# Patient Record
Sex: Female | Born: 1980 | Race: White | Hispanic: No | Smoking: Former smoker
Health system: Southern US, Community
[De-identification: ages and names within clinical notes are randomized; demographics above are authoritative.]

## PROBLEM LIST (undated history)

## (undated) DIAGNOSIS — IMO0002 Reserved for concepts with insufficient information to code with codable children: Secondary | ICD-10-CM

## (undated) DIAGNOSIS — K219 Gastro-esophageal reflux disease without esophagitis: Secondary | ICD-10-CM

## (undated) DIAGNOSIS — K8050 Calculus of bile duct without cholangitis or cholecystitis without obstruction: Secondary | ICD-10-CM

## (undated) DIAGNOSIS — N200 Calculus of kidney: Secondary | ICD-10-CM

## (undated) DIAGNOSIS — R87619 Unspecified abnormal cytological findings in specimens from cervix uteri: Secondary | ICD-10-CM

## (undated) DIAGNOSIS — F419 Anxiety disorder, unspecified: Secondary | ICD-10-CM

## (undated) HISTORY — PX: CHOLECYSTECTOMY: SHX55

## (undated) HISTORY — DX: Anxiety disorder, unspecified: F41.9

## (undated) HISTORY — DX: Unspecified abnormal cytological findings in specimens from cervix uteri: R87.619

## (undated) HISTORY — PX: TUBAL LIGATION: SHX77

## (undated) HISTORY — DX: Reserved for concepts with insufficient information to code with codable children: IMO0002

## (undated) HISTORY — PX: CRYOTHERAPY: SHX1416

## (undated) HISTORY — PX: LITHOTRIPSY: SUR834

---

## 2004-04-16 ENCOUNTER — Inpatient Hospital Stay (HOSPITAL_COMMUNITY): Admission: AD | Admit: 2004-04-16 | Discharge: 2004-04-18 | Payer: Self-pay | Admitting: Gynecology

## 2004-04-16 ENCOUNTER — Ambulatory Visit: Payer: Self-pay | Admitting: *Deleted

## 2004-04-16 ENCOUNTER — Inpatient Hospital Stay (HOSPITAL_COMMUNITY): Admission: AD | Admit: 2004-04-16 | Discharge: 2004-04-16 | Payer: Self-pay | Admitting: Gynecology

## 2004-08-13 ENCOUNTER — Emergency Department: Payer: Self-pay | Admitting: Emergency Medicine

## 2004-08-17 ENCOUNTER — Emergency Department: Payer: Self-pay | Admitting: Emergency Medicine

## 2006-06-02 ENCOUNTER — Ambulatory Visit: Payer: Self-pay | Admitting: Gynecology

## 2007-06-15 ENCOUNTER — Ambulatory Visit: Payer: Self-pay | Admitting: Gynecology

## 2007-08-05 ENCOUNTER — Ambulatory Visit: Payer: Self-pay | Admitting: Obstetrics & Gynecology

## 2007-11-09 ENCOUNTER — Ambulatory Visit: Payer: Self-pay | Admitting: Gynecology

## 2007-12-22 ENCOUNTER — Ambulatory Visit: Payer: Self-pay | Admitting: Gynecology

## 2008-06-28 ENCOUNTER — Ambulatory Visit: Payer: Self-pay | Admitting: Family Medicine

## 2008-06-29 ENCOUNTER — Ambulatory Visit: Payer: Self-pay | Admitting: Family Medicine

## 2009-05-06 ENCOUNTER — Ambulatory Visit: Payer: Self-pay | Admitting: Internal Medicine

## 2009-06-28 ENCOUNTER — Ambulatory Visit: Payer: Self-pay | Admitting: Obstetrics & Gynecology

## 2009-07-04 ENCOUNTER — Ambulatory Visit: Payer: Self-pay | Admitting: Obstetrics & Gynecology

## 2009-07-05 ENCOUNTER — Inpatient Hospital Stay (HOSPITAL_COMMUNITY): Admission: AD | Admit: 2009-07-05 | Discharge: 2009-07-05 | Payer: Self-pay | Admitting: Obstetrics & Gynecology

## 2009-07-27 ENCOUNTER — Ambulatory Visit: Payer: Self-pay | Admitting: Family Medicine

## 2009-09-05 ENCOUNTER — Ambulatory Visit: Payer: Self-pay | Admitting: Obstetrics & Gynecology

## 2009-10-02 ENCOUNTER — Ambulatory Visit (HOSPITAL_COMMUNITY): Admission: RE | Admit: 2009-10-02 | Discharge: 2009-10-02 | Payer: Self-pay | Admitting: Obstetrics & Gynecology

## 2009-10-02 ENCOUNTER — Ambulatory Visit: Payer: Self-pay | Admitting: Obstetrics and Gynecology

## 2009-10-09 ENCOUNTER — Ambulatory Visit: Payer: Self-pay | Admitting: Obstetrics and Gynecology

## 2009-11-01 ENCOUNTER — Ambulatory Visit (HOSPITAL_COMMUNITY): Admission: RE | Admit: 2009-11-01 | Discharge: 2009-11-01 | Payer: Self-pay | Admitting: Obstetrics & Gynecology

## 2009-11-07 ENCOUNTER — Ambulatory Visit: Payer: Self-pay | Admitting: Family Medicine

## 2009-12-05 ENCOUNTER — Ambulatory Visit: Payer: Self-pay | Admitting: Obstetrics & Gynecology

## 2009-12-08 ENCOUNTER — Ambulatory Visit (HOSPITAL_COMMUNITY): Admission: RE | Admit: 2009-12-08 | Discharge: 2009-12-08 | Payer: Self-pay | Admitting: Obstetrics & Gynecology

## 2009-12-26 ENCOUNTER — Ambulatory Visit: Payer: Self-pay | Admitting: Obstetrics & Gynecology

## 2010-01-04 ENCOUNTER — Ambulatory Visit (HOSPITAL_COMMUNITY): Admission: RE | Admit: 2010-01-04 | Discharge: 2010-01-04 | Payer: Self-pay | Admitting: Obstetrics & Gynecology

## 2010-01-09 ENCOUNTER — Ambulatory Visit: Payer: Self-pay | Admitting: Family Medicine

## 2010-01-23 ENCOUNTER — Ambulatory Visit: Payer: Self-pay | Admitting: Obstetrics & Gynecology

## 2010-01-30 ENCOUNTER — Ambulatory Visit: Payer: Self-pay | Admitting: Obstetrics & Gynecology

## 2010-01-30 LAB — CONVERTED CEMR LAB: GC Probe Amp, Genital: NEGATIVE

## 2010-02-06 ENCOUNTER — Ambulatory Visit: Payer: Self-pay | Admitting: Obstetrics & Gynecology

## 2010-02-08 ENCOUNTER — Ambulatory Visit: Payer: Self-pay | Admitting: Obstetrics and Gynecology

## 2010-02-13 ENCOUNTER — Ambulatory Visit: Payer: Self-pay | Admitting: Obstetrics & Gynecology

## 2010-02-20 ENCOUNTER — Ambulatory Visit: Payer: Self-pay | Admitting: Family Medicine

## 2010-02-26 ENCOUNTER — Ambulatory Visit: Payer: Self-pay | Admitting: Obstetrics and Gynecology

## 2010-02-27 ENCOUNTER — Inpatient Hospital Stay (HOSPITAL_COMMUNITY): Admission: AD | Admit: 2010-02-27 | Discharge: 2010-03-01 | Payer: Self-pay | Admitting: Obstetrics and Gynecology

## 2010-02-27 ENCOUNTER — Ambulatory Visit: Payer: Self-pay | Admitting: Obstetrics and Gynecology

## 2010-02-28 ENCOUNTER — Encounter: Payer: Self-pay | Admitting: Obstetrics and Gynecology

## 2010-04-03 ENCOUNTER — Ambulatory Visit: Payer: Self-pay | Admitting: Obstetrics & Gynecology

## 2010-07-24 ENCOUNTER — Ambulatory Visit: Payer: Self-pay | Admitting: Family Medicine

## 2010-07-24 ENCOUNTER — Other Ambulatory Visit
Admission: RE | Admit: 2010-07-24 | Discharge: 2010-07-24 | Payer: Self-pay | Source: Home / Self Care | Admitting: Obstetrics & Gynecology

## 2010-09-09 ENCOUNTER — Encounter: Payer: Self-pay | Admitting: Obstetrics and Gynecology

## 2010-11-04 LAB — CBC
HCT: 30.7 % — ABNORMAL LOW (ref 36.0–46.0)
Hemoglobin: 10.6 g/dL — ABNORMAL LOW (ref 12.0–15.0)
MCH: 31.2 pg (ref 26.0–34.0)
Platelets: 200 10*3/uL (ref 150–400)
Platelets: 240 10*3/uL (ref 150–400)
RBC: 3.95 MIL/uL (ref 3.87–5.11)
RDW: 13.3 % (ref 11.5–15.5)
RDW: 13.3 % (ref 11.5–15.5)

## 2010-11-04 LAB — RPR: RPR Ser Ql: NONREACTIVE

## 2010-11-05 ENCOUNTER — Ambulatory Visit: Payer: 59 | Admitting: Obstetrics & Gynecology

## 2010-11-05 DIAGNOSIS — N940 Mittelschmerz: Secondary | ICD-10-CM

## 2010-11-06 ENCOUNTER — Other Ambulatory Visit: Payer: Self-pay | Admitting: Obstetrics & Gynecology

## 2010-11-06 DIAGNOSIS — R102 Pelvic and perineal pain: Secondary | ICD-10-CM

## 2010-11-09 NOTE — Assessment & Plan Note (Unsigned)
Victoria [name], CATINO NO.:  [id number]  MEDICAL RECORD NO.:  [id number]           PATIENT TYPE:  LOCATION:  [facility] at [facility]           FACILITY:  PHYSICIAN:  Argentina Donovan, MD        DATE OF BIRTH:  [birth year]  DATE OF SERVICE:  11/05/2010                                 CLINIC NOTE  The patient is a 30 year old Caucasian female gravida 2, para 2-0-0-2 who has a 25-year-old child and one that is about 68 months old.  She used birth control pill between those babies, but had her tubes tied after the last one.  First several periods were fine.  She was seen here with no complaints in December.  However, she started couple months prior to that having increasingly severe pain with her periods in the lower abdomen, especially on the right side.  The pain is increased, just starts with the period.  She bleeds no more than usual about 6 days, but the pain is fairly disabling for about 4 days.  It radiates also into her back.  We are going to get an ultrasound to check on to make sure there is no masses or anything in the pelvis.  I am starting her on birth control pills for 2 months, I think that that probably will bring her back in 2 months and see how she is doing.  If she is doing well, I think we will probably keep her on the birth control pills or have her take ibuprofen during her period at a higher dose.  The alternative of course is if it does not work, we have to think of either a pelvic congestion syndrome or adenomyosis or possible endometriosis.  IMPRESSION:  Severe secondary dysmenorrhea following tubal ligation.          ______________________________ Argentina Donovan, MD    PR/MEDQ  D:  11/05/2010  T:  11/06/2010  Job:  [id number]

## 2010-11-13 ENCOUNTER — Ambulatory Visit (HOSPITAL_COMMUNITY)
Admission: RE | Admit: 2010-11-13 | Discharge: 2010-11-13 | Disposition: A | Discharge status: Home / Self Care | Payer: 59 | Source: Ambulatory Visit | Attending: Obstetrics & Gynecology | Admitting: Obstetrics & Gynecology

## 2010-11-13 DIAGNOSIS — R1031 Right lower quadrant pain: Secondary | ICD-10-CM | POA: Insufficient documentation

## 2010-11-13 DIAGNOSIS — R102 Pelvic and perineal pain: Secondary | ICD-10-CM

## 2010-11-21 LAB — URINALYSIS, ROUTINE W REFLEX MICROSCOPIC
Bilirubin Urine: NEGATIVE
Glucose, UA: NEGATIVE mg/dL
Ketones, ur: 15 mg/dL — AB
Nitrite: NEGATIVE
Protein, ur: NEGATIVE mg/dL
Specific Gravity, Urine: 1.025 (ref 1.005–1.030)
Urobilinogen, UA: 1 mg/dL (ref 0.0–1.0)

## 2010-11-21 LAB — COMPREHENSIVE METABOLIC PANEL
AST: 15 U/L (ref 0–37)
Albumin: 3 g/dL — ABNORMAL LOW (ref 3.5–5.2)
Calcium: 9 mg/dL (ref 8.4–10.5)
Chloride: 104 mEq/L (ref 96–112)
GFR calc Af Amer: >60 mL/min (ref >60)
Glucose, Bld: 100 mg/dL — ABNORMAL HIGH (ref 70–99)
Total Bilirubin: 0.4 mg/dL (ref 0.3–1.2)
Total Protein: 6.4 g/dL (ref 6.0–8.3)

## 2010-11-21 LAB — WET PREP, GENITAL: Trich, Wet Prep: NONE SEEN

## 2010-11-21 LAB — CBC
HCT: 34 % — ABNORMAL LOW (ref 36.0–46.0)
Hemoglobin: 11.5 g/dL — ABNORMAL LOW (ref 12.0–15.0)
RBC: 3.72 MIL/uL — ABNORMAL LOW (ref 3.87–5.11)

## 2010-12-25 ENCOUNTER — Ambulatory Visit: Payer: 59 | Admitting: Obstetrics and Gynecology

## 2011-01-01 NOTE — Assessment & Plan Note (Signed)
NAMEDENNICE, TINDOL               ACCOUNT NO.:  [id number]   MEDICAL RECORD NO.:  [id number]          PATIENT TYPE:  POB   LOCATION:  [facility] at [facility]         FACILITY:  [facility]   PHYSICIAN:  Allie Bossier, MD        DATE OF BIRTH:  April 15, 1981   DATE OF SERVICE:  07/24/2010                                  CLINIC NOTE   Ms. Bring is a married white G2, para 1, who has a 63-month-old daughter,  named, Bonnee Quin; 31-year-old, named Karleen Hampshire.  She comes in here for annual  exam.  She has no GYN complaints.  She does say that her family has all  been treated with a Z-Pak for recent upper respiratory infection and now  she has developed the exact same symptoms and is requesting a Z-Pak as  well.   PAST MEDICAL HISTORY:  None.   PAST SURGICAL HISTORY:  She had a tubal ligation 5 months ago.  She had  cryosurgery at age 78.   REVIEW OF SYSTEMS:  Married for the last 5-1/2 years.  She denies  dyspareunia.  She works at [employer].  The remainder of review of systems  questions are negative.  Her Pap smear was last done in September 2010,  and it was negative.   MEDICATIONS:  None.   ALLERGIES:  BACTRIM (causes I get loopy).   SOCIAL HISTORY:  She drinks rare alcohol, denies tobacco or illegal drug  use.   FAMILY HISTORY:  Negative for breast, GYN, and colon malignancies.  No  latex allergies.   PHYSICAL EXAMINATION:  GENERAL:  Well-nourished, well-hydrated very  pleasant white female, height 5 feet 7 inches, weight 139 pounds, blood  pressure 117/70, pulse 86.  HEENT:  Normal.  BREASTS:  Normal.  HEART:  Regular rate and rhythm.  LUNGS:  Clear to auscultation bilaterally.  ABDOMEN:  Benign.  No palpable hepatosplenomegaly.  Well-healed  infraumbilical excision.  GU:  External genitalia, no lesions.  Cervix, parous and no  abnormalities.  She has an ovulatory clear discharge.  Bimanual exam  reveals a normal size, shape, retroverted, minimally mobile uterus, it  is nontender.  Adnexa  nontender.  No masses.   ASSESSMENT AND PLAN:  1. Annual exam.  I have checked the Pap smear.  Recommend self-breast      and self-vulvar exams.  2. Upper respiratory infection.  I will give her a Z-Pak with no      refills.  If she is not better, then she should see her primary      care doctor.      Allie Bossier, MD     MCD/MEDQ  D:  07/24/2010  T:  07/25/2010  Job:  [id number]

## 2011-01-01 NOTE — Assessment & Plan Note (Signed)
Victoria [name], Victoria [name]               ACCOUNT NO.:  [id number]   MEDICAL RECORD NO.:  [id number]          PATIENT TYPE:  POB   LOCATION:  [facility] at [facility]         FACILITY:  [facility]   PHYSICIAN:  Tinnie Gens, MD        DATE OF BIRTH:  02-Oct-1980   DATE OF SERVICE:  06/15/2007                                  CLINIC NOTE   CHIEF COMPLAINT:  Yearly exam.   HISTORY OF PRESENT ILLNESS:  The patient is a 30 year old gravida 1,  para 1 who is here today for a yearly exam.  She is without complaints  today.  She is on Ortho Tri-Cyclen that she uses without difficulty.  Her Paps have been up-to-date.  She has a history of cryosurgery back as  a teenager, but normal Paps since then.   PAST MEDICAL HISTORY:  Negative.   PAST SURGICAL HISTORY:  Negative.   OBSTETRICAL HISTORY:  G1, P1.  SVD at term, 8 pounds.   GYN HISTORY:  Except for her abnormal Pap, no other problems.   MEDICATIONS:  She is on Ortho Tri-Cyclen.   ALLERGIES:  SULFA MEDS.   FAMILY HISTORY:  Breast cancer in maternal aunt.   SOCIAL HISTORY:  She works for [employer].  She denies tobacco, alcohol, or  drug use.   A 14-point review of systems reviewed.  She had a recent URI.  She was  treated with a Z-Pak; but she denies headache, vision changes, hearing  loss, difficulty swallowing, shortness of breath, chest pain, nausea,  vomiting, diarrhea, constipation, or change in urine.   VITAL SIGNS:  On the chart.  GENERAL:  She is a well-developed, well-nourished female in no acute  stress.  NECK:  Supple.  Normal thyroid.  LUNGS:  Clear bilaterally.  CV:  Regular rhythm and rate.  No rubs, gallops, murmurs.  ABDOMEN:  Soft, nontender, nondistended.  EXTREMITIES:  No cyanosis, clubbing, or edema.  BREASTS:  Symmetric with everted nipples.  No masses.  No  supraclavicular or axillary adenopathy.  GU:  Normal external female genitalia.  BUS is normal.  Vagina  __________ rugated.  Cervix is parous without lesions.  Uterus  is small,  anteverted.  No adnexal mass or tenderness.   IMPRESSION:  1. Gynecologic exam.  2. Possibly preparing for pregnancy.   PLAN:  1. Pap smear today.  2. Refill her Ortho Tri-Cyclen.  3. Advised on prepregnancy preparation including starting prenatal      vitamins and then just stopping her pills.  She will contact us if      she gets pregnant.           ______________________________  Tinnie Gens, MD     TP/MEDQ  D:  06/15/2007  T:  06/16/2007  Job:  [id number]

## 2011-01-01 NOTE — Assessment & Plan Note (Signed)
[name], Victoria [name]               ACCOUNT NO.:  [id number]   MEDICAL RECORD NO.:  [id number]          PATIENT TYPE:  POB   LOCATION:  [facility]         FACILITY:  [facility]   PHYSICIAN:  Tinnie Gens, MD        DATE OF BIRTH:  1980-09-27   DATE OF SERVICE:  07/04/2009                                  CLINIC NOTE   A real-time transvaginal ultrasonography was performed on this patient  for viability of pregnancy.  A single intrauterine gestation is noted  with a positive heartbeat.  The crown-rump length measures 5.7 mm  consistent with a 6-week 2-day fetus and EDC of February 25, 2010.  This  agrees with her LMP of October 4.  The overall size of the uterus  measures 9.5 cm.  The cervix measures 3.7 cm.  The patient's right  adnexa shows a normal appearing ovary that measures 2.4 x 2.1 cm.  The  patient's left adnexa shows an ovary of 2.4 x 3.5 cm.  The corpus luteum  attached.  There is no free fluid in the pelvis.  The patient tolerated  the procedure well.           ______________________________  Tinnie Gens, MD     TP/MEDQ  D:  07/04/2009  T:  07/05/2009  Job:  [id number]

## 2011-01-01 NOTE — Assessment & Plan Note (Signed)
Victoria [name]               ACCOUNT NO.:  [id number]   MEDICAL RECORD NO.:  [id number]          PATIENT TYPE:  POB   LOCATION:  [facility] at [facility]         FACILITY:  [facility]   PHYSICIAN:  Tinnie Gens, MD        DATE OF BIRTH:  September 01, 1980   DATE OF SERVICE:  06/28/2008                                  CLINIC NOTE   CHIEF COMPLAINT:  Yearly exam.   HISTORY OF PRESENT ILLNESS:  The patient is a 30 year old gravida 1,  para 1 who is here today for yearly exam.  She is without complaints.  She is on Ortho Tri-Cyclen that she has been taking for some time.  She  has no history of abnormal Pap smears.  She is uninterested in getting  pregnant at this time.   PAST MEDICAL HISTORY:  Negative.   PAST SURGICAL HISTORY:  Negative.   MEDICATIONS:  Ortho Tri-Cyclen daily.   ALLERGIES:  SULFA.   PAST OBSTETRIC HISTORY:  G1, P1, SVD at term, 8 pounds.   GYNECOLOGIC HISTORY:  History of abnormal Pap with cryo as a teenager.  Normal Pap since then.   FAMILY HISTORY:  Breast cancer in maternal aunt.   SOCIAL HISTORY:  The patient works for [employer], lives with her husband  and her child.  She denies tobacco, alcohol, or drug use.   A 14-point review of systems is reviewed.  She has a current URI with a  cough that she has primary care doctor who has been treating her and she  has been on Clarinex.  She denies headache, shortness of breath, chest  pain, dysuria, nausea, vomiting, or diarrhea, constipation.   PHYSICAL EXAMINATION:  VITAL SIGNS:  Blood pressure 116/74, weight 130.  GENERAL:  She is a well-developed, well-nourished female in no acute  distress.  HEENT:  Normocephalic, atraumatic.  Sclerae anicteric.  NECK:  Supple.  Normal thyroid.  LUNGS:  Clear bilaterally.  CV:  Regular rate and rhythm without rubs, gallops, or murmurs.  ABDOMEN:  Soft, nontender, nondistended.  EXTREMITIES:  No cyanosis, clubbing, or edema.  BREASTS:  Symmetric with everted nipples.  No masses.   No  supraclavicular or axillary adenopathy.  GU:  Normal external female genitalia.  BUS normal.  Vagina is pink and  rugated.  Cervix is parous without lesion.  Uterus is small, anteverted.  No adnexal mass or tenderness.   IMPRESSION:  1. Yearly exam birth control consult, planned Pap smear today.  2. Refill Ortho Tri-Cyclen.           ______________________________  Tinnie Gens, MD     TP/MEDQ  D:  06/28/2008  T:  06/29/2008  Job:  [id number]

## 2011-08-05 ENCOUNTER — Ambulatory Visit (INDEPENDENT_AMBULATORY_CARE_PROVIDER_SITE_OTHER): Payer: 59 | Admitting: Obstetrics & Gynecology

## 2011-08-05 ENCOUNTER — Encounter: Payer: Self-pay | Admitting: Obstetrics & Gynecology

## 2011-08-05 DIAGNOSIS — Z124 Encounter for screening for malignant neoplasm of cervix: Secondary | ICD-10-CM

## 2011-08-05 DIAGNOSIS — Z0000 Encounter for general adult medical examination without abnormal findings: Secondary | ICD-10-CM

## 2011-08-05 DIAGNOSIS — Z01419 Encounter for gynecological examination (general) (routine) without abnormal findings: Secondary | ICD-10-CM | POA: Insufficient documentation

## 2011-08-05 NOTE — Patient Instructions (Addendum)
Place premenopausal annual exam patient instructions here.  Place premenopausal annual exam patient instructions here.

## 2011-08-05 NOTE — Progress Notes (Signed)
  Subjective:     Victoria [name] is a 30 y.o. female here for a routine exam.  Current complaints: none.  Personal health questionnaire reviewed: yes.   Gets primary care with Dr. Huston Foley last seen Oct 2011.  Gynecologic History Patient's last menstrual period was 07/26/2011. Contraception: tubal ligation Last Pap: 07/2010. Results were: normal   Obstetric History OB History    Grav Para Term Preterm Abortions TAB SAB Ect Mult Living   2 2        2      # Outc Date GA Lbr Len/2nd Wgt Sex Del Anes PTL Lv   1 PAR 8/05    M    Yes   2 PAR 7/11 [redacted]w[redacted]d  7lb(3.175kg) F SVD EPI  Yes       The following portions of the patient's history were reviewed and updated as appropriate: allergies, current medications, past family history, past medical history, past social history, past surgical history and problem list.  Review of Systems A comprehensive review of systems was negative.    Objective:    BP 93/69  Pulse 76  Ht 5\' 6"  (1.676 m)  Wt 139 lb (63.05 kg)  BMI 22.44 kg/m2  LMP 07/26/2011  General Appearance:    Alert, cooperative, no distress, appears stated age  Head:    Normocephalic, without obvious abnormality, atraumatic  Eyes:    PERRL, conjunctiva/corneas clear, EOM's intact, fundi    benign, both eyes           Neck:   Supple, symmetrical, trachea midline, no adenopathy;    thyroid:  no enlargement/tenderness/nodules; no carotid   bruit or JVD  Back:     Symmetric, no curvature, ROM normal, no CVA tenderness  Lungs:     Clear to auscultation bilaterally, respirations unlabored  Chest Wall:    No tenderness or deformity   Heart:    Regular rate and rhythm, S1 and S2 normal, no murmur, rub   or gallop  Breast Exam:    No tenderness, masses, or nipple abnormality  Abdomen:     Soft, non-tender, bowel sounds active all four quadrants,    no masses, no organomegaly  Genitalia:    Normal female without lesion, discharge or tenderness     Extremities:   Extremities normal,  atraumatic, no cyanosis or edema  Pulses:   2+ and symmetric all extremities  Skin:   Skin color, texture, turgor normal, no rashes or lesions                      Vagina pink, ruggated, sm amount thin discharge; cx post clean, Uterus NSSP, no adnexal tenderness or masses  Assessment:    Healthy female exam.    Plan:    Education reviewed: calcium supplements, safe sex/STD prevention, self breast exams, skin cancer screening and weight bearing exercise. Pap sent. F/U 1 year

## 2011-08-05 NOTE — Progress Notes (Signed)
Addended by: Barbara Cower on: 08/05/2011 04:00 PM   Modules accepted: Orders

## 2012-09-29 ENCOUNTER — Ambulatory Visit: Payer: 59 | Admitting: Family Medicine

## 2012-11-03 ENCOUNTER — Encounter: Payer: Self-pay | Admitting: Family Medicine

## 2012-11-03 ENCOUNTER — Ambulatory Visit (INDEPENDENT_AMBULATORY_CARE_PROVIDER_SITE_OTHER): Payer: 59 | Admitting: Family Medicine

## 2012-11-03 VITALS — BP 110/82 | HR 95 | Temp 98.3°F | Ht 66.0 in | Wt 143.0 lb

## 2012-11-03 DIAGNOSIS — Z01419 Encounter for gynecological examination (general) (routine) without abnormal findings: Secondary | ICD-10-CM

## 2012-11-03 DIAGNOSIS — Z124 Encounter for screening for malignant neoplasm of cervix: Secondary | ICD-10-CM

## 2012-11-03 DIAGNOSIS — Z1151 Encounter for screening for human papillomavirus (HPV): Secondary | ICD-10-CM

## 2012-11-03 NOTE — Patient Instructions (Signed)
Preventive Care for Adults, Female A healthy lifestyle and preventive care can promote health and wellness. Preventive health guidelines for women include the following key practices.  A routine yearly physical is a good way to check with your caregiver about your health and preventive screening. It is a chance to share any concerns and updates on your health, and to receive a thorough exam.  Visit your dentist for a routine exam and preventive care every 6 months. Brush your teeth twice a day and floss once a day. Good oral hygiene prevents tooth decay and gum disease.  The frequency of eye exams is based on your age, health, family medical history, use of contact lenses, and other factors. Follow your caregiver's recommendations for frequency of eye exams.  Eat a healthy diet. Foods like vegetables, fruits, whole grains, low-fat dairy products, and lean protein foods contain the nutrients you need without too many calories. Decrease your intake of foods high in solid fats, added sugars, and salt. Eat the right amount of calories for you.Get information about a proper diet from your caregiver, if necessary.  Regular physical exercise is one of the most important things you can do for your health. Most adults should get at least 150 minutes of moderate-intensity exercise (any activity that increases your heart rate and causes you to sweat) each week. In addition, most adults need muscle-strengthening exercises on 2 or more days a week.  Maintain a healthy weight. The body mass index (BMI) is a screening tool to identify possible weight problems. It provides an estimate of body fat based on height and weight. Your caregiver can help determine your BMI, and can help you achieve or maintain a healthy weight.For adults 20 years and older:  A BMI below 18.5 is considered underweight.  A BMI of 18.5 to 24.9 is normal.  A BMI of 25 to 29.9 is considered overweight.  A BMI of 30 and above is  considered obese.  Maintain normal blood lipids and cholesterol levels by exercising and minimizing your intake of saturated fat. Eat a balanced diet with plenty of fruit and vegetables. Blood tests for lipids and cholesterol should begin at age 20 and be repeated every 5 years. If your lipid or cholesterol levels are high, you are over 50, or you are at high risk for heart disease, you may need your cholesterol levels checked more frequently.Ongoing high lipid and cholesterol levels should be treated with medicines if diet and exercise are not effective.  If you smoke, find out from your caregiver how to quit. If you do not use tobacco, do not start.  If you are pregnant, do not drink alcohol. If you are breastfeeding, be very cautious about drinking alcohol. If you are not pregnant and choose to drink alcohol, do not exceed 1 drink per day. One drink is considered to be 12 ounces (355 mL) of beer, 5 ounces (148 mL) of wine, or 1.5 ounces (44 mL) of liquor.  Avoid use of street drugs. Do not share needles with anyone. Ask for help if you need support or instructions about stopping the use of drugs.  High blood pressure causes heart disease and increases the risk of stroke. Your blood pressure should be checked at least every 1 to 2 years. Ongoing high blood pressure should be treated with medicines if weight loss and exercise are not effective.  If you are 55 to 33 years old, ask your caregiver if you should take aspirin to prevent strokes.  Diabetes   screening involves taking a blood sample to check your fasting blood sugar level. This should be done once every 3 years, after age 45, if you are within normal weight and without risk factors for diabetes. Testing should be considered at a younger age or be carried out more frequently if you are overweight and have at least 1 risk factor for diabetes.  Breast cancer screening is essential preventive care for women. You should practice "breast  self-awareness." This means understanding the normal appearance and feel of your breasts and may include breast self-examination. Any changes detected, no matter how small, should be reported to a caregiver. Women in their 20s and 30s should have a clinical breast exam (CBE) by a caregiver as part of a regular health exam every 1 to 3 years. After age 40, women should have a CBE every year. Starting at age 40, women should consider having a mammography (breast X-ray test) every year. Women who have a family history of breast cancer should talk to their caregiver about genetic screening. Women at a high risk of breast cancer should talk to their caregivers about having magnetic resonance imaging (MRI) and a mammography every year.  The Pap test is a screening test for cervical cancer. A Pap test can show cell changes on the cervix that might become cervical cancer if left untreated. A Pap test is a procedure in which cells are obtained and examined from the lower end of the uterus (cervix).  Women should have a Pap test starting at age 21.  Between ages 21 and 29, Pap tests should be repeated every 2 years.  Beginning at age 30, you should have a Pap test every 3 years as long as the past 3 Pap tests have been normal.  Some women have medical problems that increase the chance of getting cervical cancer. Talk to your caregiver about these problems. It is especially important to talk to your caregiver if a new problem develops soon after your last Pap test. In these cases, your caregiver may recommend more frequent screening and Pap tests.  The above recommendations are the same for women who have or have not gotten the vaccine for human papillomavirus (HPV).  If you had a hysterectomy for a problem that was not cancer or a condition that could lead to cancer, then you no longer need Pap tests. Even if you no longer need a Pap test, a regular exam is a good idea to make sure no other problems are  starting.  If you are between ages 65 and 70, and you have had normal Pap tests going back 10 years, you no longer need Pap tests. Even if you no longer need a Pap test, a regular exam is a good idea to make sure no other problems are starting.  If you have had past treatment for cervical cancer or a condition that could lead to cancer, you need Pap tests and screening for cancer for at least 20 years after your treatment.  If Pap tests have been discontinued, risk factors (such as a new sexual partner) need to be reassessed to determine if screening should be resumed.  The HPV test is an additional test that may be used for cervical cancer screening. The HPV test looks for the virus that can cause the cell changes on the cervix. The cells collected during the Pap test can be tested for HPV. The HPV test could be used to screen women aged 30 years and older, and should   be used in women of any age who have unclear Pap test results. After the age of 30, women should have HPV testing at the same frequency as a Pap test.  Colorectal cancer can be detected and often prevented. Most routine colorectal cancer screening begins at the age of 50 and continues through age 75. However, your caregiver may recommend screening at an earlier age if you have risk factors for colon cancer. On a yearly basis, your caregiver may provide home test kits to check for hidden blood in the stool. Use of a small camera at the end of a tube, to directly examine the colon (sigmoidoscopy or colonoscopy), can detect the earliest forms of colorectal cancer. Talk to your caregiver about this at age 50, when routine screening begins. Direct examination of the colon should be repeated every 5 to 10 years through age 75, unless early forms of pre-cancerous polyps or small growths are found.  Hepatitis C blood testing is recommended for all people born from 1945 through 1965 and any individual with known risks for hepatitis C.  Practice  safe sex. Use condoms and avoid high-risk sexual practices to reduce the spread of sexually transmitted infections (STIs). STIs include gonorrhea, chlamydia, syphilis, trichomonas, herpes, HPV, and human immunodeficiency virus (HIV). Herpes, HIV, and HPV are viral illnesses that have no cure. They can result in disability, cancer, and death. Sexually active women aged 25 and younger should be checked for chlamydia. Older women with new or multiple partners should also be tested for chlamydia. Testing for other STIs is recommended if you are sexually active and at increased risk.  Osteoporosis is a disease in which the bones lose minerals and strength with aging. This can result in serious bone fractures. The risk of osteoporosis can be identified using a bone density scan. Women ages 65 and over and women at risk for fractures or osteoporosis should discuss screening with their caregivers. Ask your caregiver whether you should take a calcium supplement or vitamin D to reduce the rate of osteoporosis.  Menopause can be associated with physical symptoms and risks. Hormone replacement therapy is available to decrease symptoms and risks. You should talk to your caregiver about whether hormone replacement therapy is right for you.  Use sunscreen with sun protection factor (SPF) of 30 or more. Apply sunscreen liberally and repeatedly throughout the day. You should seek shade when your shadow is shorter than you. Protect yourself by wearing long sleeves, pants, a wide-brimmed hat, and sunglasses year round, whenever you are outdoors.  Once a month, do a whole body skin exam, using a mirror to look at the skin on your back. Notify your caregiver of new moles, moles that have irregular borders, moles that are larger than a pencil eraser, or moles that have changed in shape or color.  Stay current with required immunizations.  Influenza. You need a dose every fall (or winter). The composition of the flu vaccine  changes each year, so being vaccinated once is not enough.  Pneumococcal polysaccharide. You need 1 to 2 doses if you smoke cigarettes or if you have certain chronic medical conditions. You need 1 dose at age 65 (or older) if you have never been vaccinated.  Tetanus, diphtheria, pertussis (Tdap, Td). Get 1 dose of Tdap vaccine if you are younger than age 65, are over 65 and have contact with an infant, are a healthcare worker, are pregnant, or simply want to be protected from whooping cough. After that, you need a Td   booster dose every 10 years. Consult your caregiver if you have not had at least 3 tetanus and diphtheria-containing shots sometime in your life or have a deep or dirty wound.  HPV. You need this vaccine if you are a woman age 26 or younger. The vaccine is given in 3 doses over 6 months.  Measles, mumps, rubella (MMR). You need at least 1 dose of MMR if you were born in 1957 or later. You may also need a second dose.  Meningococcal. If you are age 19 to 21 and a first-year college student living in a residence hall, or have one of several medical conditions, you need to get vaccinated against meningococcal disease. You may also need additional booster doses.  Zoster (shingles). If you are age 60 or older, you should get this vaccine.  Varicella (chickenpox). If you have never had chickenpox or you were vaccinated but received only 1 dose, talk to your caregiver to find out if you need this vaccine.  Hepatitis A. You need this vaccine if you have a specific risk factor for hepatitis A virus infection or you simply wish to be protected from this disease. The vaccine is usually given as 2 doses, 6 to 18 months apart.  Hepatitis B. You need this vaccine if you have a specific risk factor for hepatitis B virus infection or you simply wish to be protected from this disease. The vaccine is given in 3 doses, usually over 6 months. Preventive Services / Frequency Ages 19 to 39  Blood  pressure check.** / Every 1 to 2 years.  Lipid and cholesterol check.** / Every 5 years beginning at age 20.  Clinical breast exam.** / Every 3 years for women in their 20s and 30s.  Pap test.** / Every 2 years from ages 21 through 29. Every 3 years starting at age 30 through age 65 or 70 with a history of 3 consecutive normal Pap tests.  HPV screening.** / Every 3 years from ages 30 through ages 65 to 70 with a history of 3 consecutive normal Pap tests.  Hepatitis C blood test.** / For any individual with known risks for hepatitis C.  Skin self-exam. / Monthly.  Influenza immunization.** / Every year.  Pneumococcal polysaccharide immunization.** / 1 to 2 doses if you smoke cigarettes or if you have certain chronic medical conditions.  Tetanus, diphtheria, pertussis (Tdap, Td) immunization. / A one-time dose of Tdap vaccine. After that, you need a Td booster dose every 10 years.  HPV immunization. / 3 doses over 6 months, if you are 26 and younger.  Measles, mumps, rubella (MMR) immunization. / You need at least 1 dose of MMR if you were born in 1957 or later. You may also need a second dose.  Meningococcal immunization. / 1 dose if you are age 19 to 21 and a first-year college student living in a residence hall, or have one of several medical conditions, you need to get vaccinated against meningococcal disease. You may also need additional booster doses.  Varicella immunization.** / Consult your caregiver.  Hepatitis A immunization.** / Consult your caregiver. 2 doses, 6 to 18 months apart.  Hepatitis B immunization.** / Consult your caregiver. 3 doses usually over 6 months. Ages 40 to 64  Blood pressure check.** / Every 1 to 2 years.  Lipid and cholesterol check.** / Every 5 years beginning at age 20.  Clinical breast exam.** / Every year after age 40.  Mammogram.** / Every year beginning at age 40   and continuing for as long as you are in good health. Consult with your  caregiver.  Pap test.** / Every 3 years starting at age 30 through age 65 or 70 with a history of 3 consecutive normal Pap tests.  HPV screening.** / Every 3 years from ages 30 through ages 65 to 70 with a history of 3 consecutive normal Pap tests.  Fecal occult blood test (FOBT) of stool. / Every year beginning at age 50 and continuing until age 75. You may not need to do this test if you get a colonoscopy every 10 years.  Flexible sigmoidoscopy or colonoscopy.** / Every 5 years for a flexible sigmoidoscopy or every 10 years for a colonoscopy beginning at age 50 and continuing until age 75.  Hepatitis C blood test.** / For all people born from 1945 through 1965 and any individual with known risks for hepatitis C.  Skin self-exam. / Monthly.  Influenza immunization.** / Every year.  Pneumococcal polysaccharide immunization.** / 1 to 2 doses if you smoke cigarettes or if you have certain chronic medical conditions.  Tetanus, diphtheria, pertussis (Tdap, Td) immunization.** / A one-time dose of Tdap vaccine. After that, you need a Td booster dose every 10 years.  Measles, mumps, rubella (MMR) immunization. / You need at least 1 dose of MMR if you were born in 1957 or later. You may also need a second dose.  Varicella immunization.** / Consult your caregiver.  Meningococcal immunization.** / Consult your caregiver.  Hepatitis A immunization.** / Consult your caregiver. 2 doses, 6 to 18 months apart.  Hepatitis B immunization.** / Consult your caregiver. 3 doses, usually over 6 months. Ages 65 and over  Blood pressure check.** / Every 1 to 2 years.  Lipid and cholesterol check.** / Every 5 years beginning at age 20.  Clinical breast exam.** / Every year after age 40.  Mammogram.** / Every year beginning at age 40 and continuing for as long as you are in good health. Consult with your caregiver.  Pap test.** / Every 3 years starting at age 30 through age 65 or 70 with a 3  consecutive normal Pap tests. Testing can be stopped between 65 and 70 with 3 consecutive normal Pap tests and no abnormal Pap or HPV tests in the past 10 years.  HPV screening.** / Every 3 years from ages 30 through ages 65 or 70 with a history of 3 consecutive normal Pap tests. Testing can be stopped between 65 and 70 with 3 consecutive normal Pap tests and no abnormal Pap or HPV tests in the past 10 years.  Fecal occult blood test (FOBT) of stool. / Every year beginning at age 50 and continuing until age 75. You may not need to do this test if you get a colonoscopy every 10 years.  Flexible sigmoidoscopy or colonoscopy.** / Every 5 years for a flexible sigmoidoscopy or every 10 years for a colonoscopy beginning at age 50 and continuing until age 75.  Hepatitis C blood test.** / For all people born from 1945 through 1965 and any individual with known risks for hepatitis C.  Osteoporosis screening.** / A one-time screening for women ages 65 and over and women at risk for fractures or osteoporosis.  Skin self-exam. / Monthly.  Influenza immunization.** / Every year.  Pneumococcal polysaccharide immunization.** / 1 dose at age 65 (or older) if you have never been vaccinated.  Tetanus, diphtheria, pertussis (Tdap, Td) immunization. / A one-time dose of Tdap vaccine if you are over   65 and have contact with an infant, are a healthcare worker, or simply want to be protected from whooping cough. After that, you need a Td booster dose every 10 years.  Varicella immunization.** / Consult your caregiver.  Meningococcal immunization.** / Consult your caregiver.  Hepatitis A immunization.** / Consult your caregiver. 2 doses, 6 to 18 months apart.  Hepatitis B immunization.** / Check with your caregiver. 3 doses, usually over 6 months. ** Family history and personal history of risk and conditions may change your caregiver's recommendations. Document Released: 10/01/2001 Document Revised: 10/28/2011  Document Reviewed: 12/31/2010 [employer] Patient Information 2013 [employer].  

## 2012-11-03 NOTE — Progress Notes (Signed)
  Subjective:     Victoria [name] is a 32 y.o. female and is here for a comprehensive physical exam. The patient reports no problems.  S/p BTL.  Nml, regular cycles.  History   Social History  . Marital Status: Married    Spouse Name: N/A    Number of Children: N/A  . Years of Education: N/A   Occupational History  . Not on file.   Social History Main Topics  . Smoking status: Never Smoker   . Smokeless tobacco: Never Used  . Alcohol Use: Yes     Comment: OCCASIONALLY  . Drug Use: No  . Sexually Active: Yes -- Female partner(s)    Birth Control/ Protection: Surgical     Comment: tubaligation.   Other Topics Concern  . Not on file   Social History Narrative  . No narrative on file   Health Maintenance  Topic Date Due  . Influenza Vaccine  04/19/1980  . Pap Smear  03/26/1998  . Tetanus/tdap  03/27/1999    The following portions of the patient's history were reviewed and updated as appropriate: allergies, current medications, past family history, past medical history, past social history, past surgical history and problem list.  Review of Systems A comprehensive review of systems was negative.   Objective:    BP 110/82  Pulse 95  Temp(Src) 98.3 F (36.8 C)  Ht 5\' 6"  (1.676 m)  Wt 143 lb (64.864 kg)  BMI 23.09 kg/m2  LMP 10/18/2012 General appearance: alert, cooperative and appears stated age Head: Normocephalic, without obvious abnormality, atraumatic Neck: no adenopathy, supple, symmetrical, trachea midline and thyroid not enlarged, symmetric, no tenderness/mass/nodules Lungs: clear to auscultation bilaterally Breasts: normal appearance, no masses or tenderness Heart: regular rate and rhythm, S1, S2 normal, no murmur, click, rub or gallop Abdomen: soft, non-tender; bowel sounds normal; no masses,  no organomegaly Pelvic: cervix normal in appearance, external genitalia normal, no adnexal masses or tenderness, no cervical motion tenderness, uterus normal size,  shape, and consistency and vagina normal without discharge Extremities: extremities normal, atraumatic, no cyanosis or edema Pulses: 2+ and symmetric Skin: Skin color, texture, turgor normal. No rashes or lesions Lymph nodes: Cervical, supraclavicular, and axillary nodes normal. Neurologic: Grossly normal    Assessment:    Healthy female exam.      Plan:    Pap smear today. See After Visit Summary for Counseling Recommendations

## 2012-11-03 NOTE — Progress Notes (Signed)
Here for annual. C/o issues with excess sweating x 6 months.

## 2013-06-10 ENCOUNTER — Ambulatory Visit: Payer: Self-pay | Admitting: Family Medicine

## 2014-06-20 ENCOUNTER — Encounter: Payer: Self-pay | Admitting: Family Medicine

## 2014-07-18 ENCOUNTER — Ambulatory Visit: Payer: 59 | Admitting: Family Medicine

## 2014-09-05 ENCOUNTER — Ambulatory Visit: Payer: 59 | Admitting: Family Medicine

## 2014-09-12 ENCOUNTER — Ambulatory Visit: Payer: Self-pay | Admitting: Obstetrics & Gynecology

## 2014-09-27 ENCOUNTER — Encounter: Payer: Self-pay | Admitting: Family Medicine

## 2014-09-27 ENCOUNTER — Ambulatory Visit (INDEPENDENT_AMBULATORY_CARE_PROVIDER_SITE_OTHER): Payer: 59 | Admitting: Family Medicine

## 2014-09-27 VITALS — BP 117/76 | HR 80 | Ht 70.0 in | Wt 151.0 lb

## 2014-09-27 DIAGNOSIS — Z1151 Encounter for screening for human papillomavirus (HPV): Secondary | ICD-10-CM | POA: Diagnosis not present

## 2014-09-27 DIAGNOSIS — Z01419 Encounter for gynecological examination (general) (routine) without abnormal findings: Secondary | ICD-10-CM

## 2014-09-27 DIAGNOSIS — Z124 Encounter for screening for malignant neoplasm of cervix: Secondary | ICD-10-CM | POA: Diagnosis not present

## 2014-09-27 NOTE — Patient Instructions (Signed)
Preventive Care for Adults A healthy lifestyle and preventive care can promote health and wellness. Preventive health guidelines for women include the following key practices.  A routine yearly physical is a good way to check with your health care provider about your health and preventive screening. It is a chance to share any concerns and updates on your health and to receive a thorough exam.  Visit your dentist for a routine exam and preventive care every 6 months. Brush your teeth twice a day and floss once a day. Good oral hygiene prevents tooth decay and gum disease.  The frequency of eye exams is based on your age, health, family medical history, use of contact lenses, and other factors. Follow your health care provider's recommendations for frequency of eye exams.  Eat a healthy diet. Foods like vegetables, fruits, whole grains, low-fat dairy products, and lean protein foods contain the nutrients you need without too many calories. Decrease your intake of foods high in solid fats, added sugars, and salt. Eat the right amount of calories for you.Get information about a proper diet from your health care provider, if necessary.  Regular physical exercise is one of the most important things you can do for your health. Most adults should get at least 150 minutes of moderate-intensity exercise (any activity that increases your heart rate and causes you to sweat) each week. In addition, most adults need muscle-strengthening exercises on 2 or more days a week.  Maintain a healthy weight. The body mass index (BMI) is a screening tool to identify possible weight problems. It provides an estimate of body fat based on height and weight. Your health care provider can find your BMI and can help you achieve or maintain a healthy weight.For adults 20 years and older:  A BMI below 18.5 is considered underweight.  A BMI of 18.5 to 24.9 is normal.  A BMI of 25 to 29.9 is considered overweight.  A BMI of  30 and above is considered obese.  Maintain normal blood lipids and cholesterol levels by exercising and minimizing your intake of saturated fat. Eat a balanced diet with plenty of fruit and vegetables. Blood tests for lipids and cholesterol should begin at age 76 and be repeated every 5 years. If your lipid or cholesterol levels are high, you are over 50, or you are at high risk for heart disease, you may need your cholesterol levels checked more frequently.Ongoing high lipid and cholesterol levels should be treated with medicines if diet and exercise are not working.  If you smoke, find out from your health care provider how to quit. If you do not use tobacco, do not start.  Lung cancer screening is recommended for adults aged 22-80 years who are at high risk for developing lung cancer because of a history of smoking. A yearly low-dose CT scan of the lungs is recommended for people who have at least a 30-pack-year history of smoking and are a current smoker or have quit within the past 15 years. A pack year of smoking is smoking an average of 1 pack of cigarettes a day for 1 year (for example: 1 pack a day for 30 years or 2 packs a day for 15 years). Yearly screening should continue until the smoker has stopped smoking for at least 15 years. Yearly screening should be stopped for people who develop a health problem that would prevent them from having lung cancer treatment.  If you are pregnant, do not drink alcohol. If you are breastfeeding,  be very cautious about drinking alcohol. If you are not pregnant and choose to drink alcohol, do not have more than 1 drink per day. One drink is considered to be 12 ounces (355 mL) of beer, 5 ounces (148 mL) of wine, or 1.5 ounces (44 mL) of liquor.  Avoid use of street drugs. Do not share needles with anyone. Ask for help if you need support or instructions about stopping the use of drugs.  High blood pressure causes heart disease and increases the risk of  stroke. Your blood pressure should be checked at least every 1 to 2 years. Ongoing high blood pressure should be treated with medicines if weight loss and exercise do not work.  If you are 75-52 years old, ask your health care provider if you should take aspirin to prevent strokes.  Diabetes screening involves taking a blood sample to check your fasting blood sugar level. This should be done once every 3 years, after age 15, if you are within normal weight and without risk factors for diabetes. Testing should be considered at a younger age or be carried out more frequently if you are overweight and have at least 1 risk factor for diabetes.  Breast cancer screening is essential preventive care for women. You should practice "breast self-awareness." This means understanding the normal appearance and feel of your breasts and may include breast self-examination. Any changes detected, no matter how small, should be reported to a health care provider. Women in their 58s and 30s should have a clinical breast exam (CBE) by a health care provider as part of a regular health exam every 1 to 3 years. After age 16, women should have a CBE every year. Starting at age 53, women should consider having a mammogram (breast X-ray test) every year. Women who have a family history of breast cancer should talk to their health care provider about genetic screening. Women at a high risk of breast cancer should talk to their health care providers about having an MRI and a mammogram every year.  Breast cancer gene (BRCA)-related cancer risk assessment is recommended for women who have family members with BRCA-related cancers. BRCA-related cancers include breast, ovarian, tubal, and peritoneal cancers. Having family members with these cancers may be associated with an increased risk for harmful changes (mutations) in the breast cancer genes BRCA1 and BRCA2. Results of the assessment will determine the need for genetic counseling and  BRCA1 and BRCA2 testing.  Routine pelvic exams to screen for cancer are no longer recommended for nonpregnant women who are considered low risk for cancer of the pelvic organs (ovaries, uterus, and vagina) and who do not have symptoms. Ask your health care provider if a screening pelvic exam is right for you.  If you have had past treatment for cervical cancer or a condition that could lead to cancer, you need Pap tests and screening for cancer for at least 20 years after your treatment. If Pap tests have been discontinued, your risk factors (such as having a new sexual partner) need to be reassessed to determine if screening should be resumed. Some women have medical problems that increase the chance of getting cervical cancer. In these cases, your health care provider may recommend more frequent screening and Pap tests.  The HPV test is an additional test that may be used for cervical cancer screening. The HPV test looks for the virus that can cause the cell changes on the cervix. The cells collected during the Pap test can be  tested for HPV. The HPV test could be used to screen women aged 30 years and older, and should be used in women of any age who have unclear Pap test results. After the age of 30, women should have HPV testing at the same frequency as a Pap test.  Colorectal cancer can be detected and often prevented. Most routine colorectal cancer screening begins at the age of 50 years and continues through age 75 years. However, your health care provider may recommend screening at an earlier age if you have risk factors for colon cancer. On a yearly basis, your health care provider may provide home test kits to check for hidden blood in the stool. Use of a small camera at the end of a tube, to directly examine the colon (sigmoidoscopy or colonoscopy), can detect the earliest forms of colorectal cancer. Talk to your health care provider about this at age 50, when routine screening begins. Direct  exam of the colon should be repeated every 5-10 years through age 75 years, unless early forms of pre-cancerous polyps or small growths are found.  People who are at an increased risk for hepatitis B should be screened for this virus. You are considered at high risk for hepatitis B if:  You were born in a country where hepatitis B occurs often. Talk with your health care provider about which countries are considered high risk.  Your parents were born in a high-risk country and you have not received a shot to protect against hepatitis B (hepatitis B vaccine).  You have HIV or AIDS.  You use needles to inject street drugs.  You live with, or have sex with, someone who has hepatitis B.  You get hemodialysis treatment.  You take certain medicines for conditions like cancer, organ transplantation, and autoimmune conditions.  Hepatitis C blood testing is recommended for all people born from 1945 through 1965 and any individual with known risks for hepatitis C.  Practice safe sex. Use condoms and avoid high-risk sexual practices to reduce the spread of sexually transmitted infections (STIs). STIs include gonorrhea, chlamydia, syphilis, trichomonas, herpes, HPV, and human immunodeficiency virus (HIV). Herpes, HIV, and HPV are viral illnesses that have no cure. They can result in disability, cancer, and death.  You should be screened for sexually transmitted illnesses (STIs) including gonorrhea and chlamydia if:  You are sexually active and are younger than 24 years.  You are older than 24 years and your health care provider tells you that you are at risk for this type of infection.  Your sexual activity has changed since you were last screened and you are at an increased risk for chlamydia or gonorrhea. Ask your health care provider if you are at risk.  If you are at risk of being infected with HIV, it is recommended that you take a prescription medicine daily to prevent HIV infection. This is  called preexposure prophylaxis (PrEP). You are considered at risk if:  You are a heterosexual woman, are sexually active, and are at increased risk for HIV infection.  You take drugs by injection.  You are sexually active with a partner who has HIV.  Talk with your health care provider about whether you are at high risk of being infected with HIV. If you choose to begin PrEP, you should first be tested for HIV. You should then be tested every 3 months for as long as you are taking PrEP.  Osteoporosis is a disease in which the bones lose minerals and strength   with aging. This can result in serious bone fractures or breaks. The risk of osteoporosis can be identified using a bone density scan. Women ages 65 years and over and women at risk for fractures or osteoporosis should discuss screening with their health care providers. Ask your health care provider whether you should take a calcium supplement or vitamin D to reduce the rate of osteoporosis.  Menopause can be associated with physical symptoms and risks. Hormone replacement therapy is available to decrease symptoms and risks. You should talk to your health care provider about whether hormone replacement therapy is right for you.  Use sunscreen. Apply sunscreen liberally and repeatedly throughout the day. You should seek shade when your shadow is shorter than you. Protect yourself by wearing long sleeves, pants, a wide-brimmed hat, and sunglasses year round, whenever you are outdoors.  Once a month, do a whole body skin exam, using a mirror to look at the skin on your back. Tell your health care provider of new moles, moles that have irregular borders, moles that are larger than a pencil eraser, or moles that have changed in shape or color.  Stay current with required vaccines (immunizations).  Influenza vaccine. All adults should be immunized every year.  Tetanus, diphtheria, and acellular pertussis (Td, Tdap) vaccine. Pregnant women should  receive 1 dose of Tdap vaccine during each pregnancy. The dose should be obtained regardless of the length of time since the last dose. Immunization is preferred during the 27th-36th week of gestation. An adult who has not previously received Tdap or who does not know her vaccine status should receive 1 dose of Tdap. This initial dose should be followed by tetanus and diphtheria toxoids (Td) booster doses every 10 years. Adults with an unknown or incomplete history of completing a 3-dose immunization series with Td-containing vaccines should begin or complete a primary immunization series including a Tdap dose. Adults should receive a Td booster every 10 years.  Varicella vaccine. An adult without evidence of immunity to varicella should receive 2 doses or a second dose if she has previously received 1 dose. Pregnant females who do not have evidence of immunity should receive the first dose after pregnancy. This first dose should be obtained before leaving the health care facility. The second dose should be obtained 4-8 weeks after the first dose.  Human papillomavirus (HPV) vaccine. Females aged 13-26 years who have not received the vaccine previously should obtain the 3-dose series. The vaccine is not recommended for use in pregnant females. However, pregnancy testing is not needed before receiving a dose. If a female is found to be pregnant after receiving a dose, no treatment is needed. In that case, the remaining doses should be delayed until after the pregnancy. Immunization is recommended for any person with an immunocompromised condition through the age of 26 years if she did not get any or all doses earlier. During the 3-dose series, the second dose should be obtained 4-8 weeks after the first dose. The third dose should be obtained 24 weeks after the first dose and 16 weeks after the second dose.  Zoster vaccine. One dose is recommended for adults aged 60 years or older unless certain conditions are  present.  Measles, mumps, and rubella (MMR) vaccine. Adults born before 1957 generally are considered immune to measles and mumps. Adults born in 1957 or later should have 1 or more doses of MMR vaccine unless there is a contraindication to the vaccine or there is laboratory evidence of immunity to   each of the three diseases. A routine second dose of MMR vaccine should be obtained at least 28 days after the first dose for students attending postsecondary schools, health care workers, or international travelers. People who received inactivated measles vaccine or an unknown type of measles vaccine during 1963-1967 should receive 2 doses of MMR vaccine. People who received inactivated mumps vaccine or an unknown type of mumps vaccine before 1979 and are at high risk for mumps infection should consider immunization with 2 doses of MMR vaccine. For females of childbearing age, rubella immunity should be determined. If there is no evidence of immunity, females who are not pregnant should be vaccinated. If there is no evidence of immunity, females who are pregnant should delay immunization until after pregnancy. Unvaccinated health care workers born before 1957 who lack laboratory evidence of measles, mumps, or rubella immunity or laboratory confirmation of disease should consider measles and mumps immunization with 2 doses of MMR vaccine or rubella immunization with 1 dose of MMR vaccine.  Pneumococcal 13-valent conjugate (PCV13) vaccine. When indicated, a person who is uncertain of her immunization history and has no record of immunization should receive the PCV13 vaccine. An adult aged 19 years or older who has certain medical conditions and has not been previously immunized should receive 1 dose of PCV13 vaccine. This PCV13 should be followed with a dose of pneumococcal polysaccharide (PPSV23) vaccine. The PPSV23 vaccine dose should be obtained at least 8 weeks after the dose of PCV13 vaccine. An adult aged 19  years or older who has certain medical conditions and previously received 1 or more doses of PPSV23 vaccine should receive 1 dose of PCV13. The PCV13 vaccine dose should be obtained 1 or more years after the last PPSV23 vaccine dose.  Pneumococcal polysaccharide (PPSV23) vaccine. When PCV13 is also indicated, PCV13 should be obtained first. All adults aged 65 years and older should be immunized. An adult younger than age 65 years who has certain medical conditions should be immunized. Any person who resides in a nursing home or long-term care facility should be immunized. An adult smoker should be immunized. People with an immunocompromised condition and certain other conditions should receive both PCV13 and PPSV23 vaccines. People with human immunodeficiency virus (HIV) infection should be immunized as soon as possible after diagnosis. Immunization during chemotherapy or radiation therapy should be avoided. Routine use of PPSV23 vaccine is not recommended for American Indians, [place] Natives, or people younger than 65 years unless there are medical conditions that require PPSV23 vaccine. When indicated, people who have unknown immunization and have no record of immunization should receive PPSV23 vaccine. One-time revaccination 5 years after the first dose of PPSV23 is recommended for people aged 19-64 years who have chronic kidney failure, nephrotic syndrome, asplenia, or immunocompromised conditions. People who received 1-2 doses of PPSV23 before age 65 years should receive another dose of PPSV23 vaccine at age 65 years or later if at least 5 years have passed since the previous dose. Doses of PPSV23 are not needed for people immunized with PPSV23 at or after age 65 years.  Meningococcal vaccine. Adults with asplenia or persistent complement component deficiencies should receive 2 doses of quadrivalent meningococcal conjugate (MenACWY-D) vaccine. The doses should be obtained at least 2 months apart.  Microbiologists working with certain meningococcal bacteria, military recruits, people at risk during an outbreak, and people who travel to or live in countries with a high rate of meningitis should be immunized. A first-year college student up through age   21 years who is living in a residence hall should receive a dose if she did not receive a dose on or after her 16th birthday. Adults who have certain high-risk conditions should receive one or more doses of vaccine.  Hepatitis A vaccine. Adults who wish to be protected from this disease, have certain high-risk conditions, work with hepatitis A-infected animals, work in hepatitis A research labs, or travel to or work in countries with a high rate of hepatitis A should be immunized. Adults who were previously unvaccinated and who anticipate close contact with an international adoptee during the first 60 days after arrival in the [place] from a country with a high rate of hepatitis A should be immunized.  Hepatitis B vaccine. Adults who wish to be protected from this disease, have certain high-risk conditions, may be exposed to blood or other infectious body fluids, are household contacts or sex partners of hepatitis B positive people, are clients or workers in certain care facilities, or travel to or work in countries with a high rate of hepatitis B should be immunized.  Haemophilus influenzae type b (Hib) vaccine. A previously unvaccinated person with asplenia or sickle cell disease or having a scheduled splenectomy should receive 1 dose of Hib vaccine. Regardless of previous immunization, a recipient of a hematopoietic stem cell transplant should receive a 3-dose series 6-12 months after her successful transplant. Hib vaccine is not recommended for adults with HIV infection. Preventive Services / Frequency Ages 64 to 68 years  Blood pressure check.** / Every 1 to 2 years.  Lipid and cholesterol check.** / Every 5 years beginning at age  22.  Clinical breast exam.** / Every 3 years for women in their 88s and 53s.  BRCA-related cancer risk assessment.** / For women who have family members with a BRCA-related cancer (breast, ovarian, tubal, or peritoneal cancers).  Pap test.** / Every 2 years from ages 90 through 51. Every 3 years starting at age 21 through age 56 or 3 with a history of 3 consecutive normal Pap tests.  HPV screening.** / Every 3 years from ages 24 through ages 1 to 46 with a history of 3 consecutive normal Pap tests.  Hepatitis C blood test.** / For any individual with known risks for hepatitis C.  Skin self-exam. / Monthly.  Influenza vaccine. / Every year.  Tetanus, diphtheria, and acellular pertussis (Tdap, Td) vaccine.** / Consult your health care provider. Pregnant women should receive 1 dose of Tdap vaccine during each pregnancy. 1 dose of Td every 10 years.  Varicella vaccine.** / Consult your health care provider. Pregnant females who do not have evidence of immunity should receive the first dose after pregnancy.  HPV vaccine. / 3 doses over 6 months, if 72 and younger. The vaccine is not recommended for use in pregnant females. However, pregnancy testing is not needed before receiving a dose.  Measles, mumps, rubella (MMR) vaccine.** / You need at least 1 dose of MMR if you were born in 1957 or later. You may also need a 2nd dose. For females of childbearing age, rubella immunity should be determined. If there is no evidence of immunity, females who are not pregnant should be vaccinated. If there is no evidence of immunity, females who are pregnant should delay immunization until after pregnancy.  Pneumococcal 13-valent conjugate (PCV13) vaccine.** / Consult your health care provider.  Pneumococcal polysaccharide (PPSV23) vaccine.** / 1 to 2 doses if you smoke cigarettes or if you have certain conditions.  Meningococcal vaccine.** /  1 dose if you are age 19 to 21 years and a first-year college  student living in a residence hall, or have one of several medical conditions, you need to get vaccinated against meningococcal disease. You may also need additional booster doses.  Hepatitis A vaccine.** / Consult your health care provider.  Hepatitis B vaccine.** / Consult your health care provider.  Haemophilus influenzae type b (Hib) vaccine.** / Consult your health care provider. Ages 40 to 64 years  Blood pressure check.** / Every 1 to 2 years.  Lipid and cholesterol check.** / Every 5 years beginning at age 20 years.  Lung cancer screening. / Every year if you are aged 55-80 years and have a 30-pack-year history of smoking and currently smoke or have quit within the past 15 years. Yearly screening is stopped once you have quit smoking for at least 15 years or develop a health problem that would prevent you from having lung cancer treatment.  Clinical breast exam.** / Every year after age 40 years.  BRCA-related cancer risk assessment.** / For women who have family members with a BRCA-related cancer (breast, ovarian, tubal, or peritoneal cancers).  Mammogram.** / Every year beginning at age 40 years and continuing for as long as you are in good health. Consult with your health care provider.  Pap test.** / Every 3 years starting at age 30 years through age 65 or 70 years with a history of 3 consecutive normal Pap tests.  HPV screening.** / Every 3 years from ages 30 years through ages 65 to 70 years with a history of 3 consecutive normal Pap tests.  Fecal occult blood test (FOBT) of stool. / Every year beginning at age 50 years and continuing until age 75 years. You may not need to do this test if you get a colonoscopy every 10 years.  Flexible sigmoidoscopy or colonoscopy.** / Every 5 years for a flexible sigmoidoscopy or every 10 years for a colonoscopy beginning at age 50 years and continuing until age 75 years.  Hepatitis C blood test.** / For all people born from 1945 through  1965 and any individual with known risks for hepatitis C.  Skin self-exam. / Monthly.  Influenza vaccine. / Every year.  Tetanus, diphtheria, and acellular pertussis (Tdap/Td) vaccine.** / Consult your health care provider. Pregnant women should receive 1 dose of Tdap vaccine during each pregnancy. 1 dose of Td every 10 years.  Varicella vaccine.** / Consult your health care provider. Pregnant females who do not have evidence of immunity should receive the first dose after pregnancy.  Zoster vaccine.** / 1 dose for adults aged 60 years or older.  Measles, mumps, rubella (MMR) vaccine.** / You need at least 1 dose of MMR if you were born in 1957 or later. You may also need a 2nd dose. For females of childbearing age, rubella immunity should be determined. If there is no evidence of immunity, females who are not pregnant should be vaccinated. If there is no evidence of immunity, females who are pregnant should delay immunization until after pregnancy.  Pneumococcal 13-valent conjugate (PCV13) vaccine.** / Consult your health care provider.  Pneumococcal polysaccharide (PPSV23) vaccine.** / 1 to 2 doses if you smoke cigarettes or if you have certain conditions.  Meningococcal vaccine.** / Consult your health care provider.  Hepatitis A vaccine.** / Consult your health care provider.  Hepatitis B vaccine.** / Consult your health care provider.  Haemophilus influenzae type b (Hib) vaccine.** / Consult your health care provider. Ages 65   years and over  Blood pressure check.** / Every 1 to 2 years.  Lipid and cholesterol check.** / Every 5 years beginning at age 22 years.  Lung cancer screening. / Every year if you are aged 73-80 years and have a 30-pack-year history of smoking and currently smoke or have quit within the past 15 years. Yearly screening is stopped once you have quit smoking for at least 15 years or develop a health problem that would prevent you from having lung cancer  treatment.  Clinical breast exam.** / Every year after age 4 years.  BRCA-related cancer risk assessment.** / For women who have family members with a BRCA-related cancer (breast, ovarian, tubal, or peritoneal cancers).  Mammogram.** / Every year beginning at age 40 years and continuing for as long as you are in good health. Consult with your health care provider.  Pap test.** / Every 3 years starting at age 9 years through age 34 or 91 years with 3 consecutive normal Pap tests. Testing can be stopped between 65 and 70 years with 3 consecutive normal Pap tests and no abnormal Pap or HPV tests in the past 10 years.  HPV screening.** / Every 3 years from ages 57 years through ages 64 or 45 years with a history of 3 consecutive normal Pap tests. Testing can be stopped between 65 and 70 years with 3 consecutive normal Pap tests and no abnormal Pap or HPV tests in the past 10 years.  Fecal occult blood test (FOBT) of stool. / Every year beginning at age 15 years and continuing until age 17 years. You may not need to do this test if you get a colonoscopy every 10 years.  Flexible sigmoidoscopy or colonoscopy.** / Every 5 years for a flexible sigmoidoscopy or every 10 years for a colonoscopy beginning at age 86 years and continuing until age 71 years.  Hepatitis C blood test.** / For all people born from [birth year] through 1966 and any individual with known risks for hepatitis C.  Osteoporosis screening.** / A one-time screening for women ages 83 years and over and women at risk for fractures or osteoporosis.  Skin self-exam. / Monthly.  Influenza vaccine. / Every year.  Tetanus, diphtheria, and acellular pertussis (Tdap/Td) vaccine.** / 1 dose of Td every 10 years.  Varicella vaccine.** / Consult your health care provider.  Zoster vaccine.** / 1 dose for adults aged 61 years or older.  Pneumococcal 13-valent conjugate (PCV13) vaccine.** / Consult your health care provider.  Pneumococcal  polysaccharide (PPSV23) vaccine.** / 1 dose for all adults aged 28 years and older.  Meningococcal vaccine.** / Consult your health care provider.  Hepatitis A vaccine.** / Consult your health care provider.  Hepatitis B vaccine.** / Consult your health care provider.  Haemophilus influenzae type b (Hib) vaccine.** / Consult your health care provider. ** Family history and personal history of risk and conditions may change your health care provider's recommendations. Document Released: 10/01/2001 Document Revised: 12/20/2013 Document Reviewed: 12/31/2010 [facility] Patient Information 2015 [place], [place]. This information is not intended to replace advice given to you by your health care provider. Make sure you discuss any questions you have with your health care provider.

## 2014-09-27 NOTE — Progress Notes (Signed)
  Subjective:     Victoria [name] is a 34 y.o. female and is here for a comprehensive physical exam. The patient reports problems - occasional hot flashes prior to cycle.  Having regular cycles, s/p BTL.Marland Kitchen.  History   Social History  . Marital Status: Married    Spouse Name: N/A    Number of Children: N/A  . Years of Education: N/A   Occupational History  . Not on file.   Social History Main Topics  . Smoking status: Never Smoker   . Smokeless tobacco: Never Used  . Alcohol Use: Yes     Comment: OCCASIONALLY  . Drug Use: No  . Sexual Activity:    Partners: Male    Birth Control/ Protection: Surgical     Comment: tubaligation.   Other Topics Concern  . Not on file   Social History Narrative   Health Maintenance  Topic Date Due  . Janet BerlinETANUS/TDAP  03/27/1999  . INFLUENZA VACCINE  03/19/2014  . PAP SMEAR  11/04/2015    The following portions of the patient's history were reviewed and updated as appropriate: allergies, current medications, past family history, past medical history, past social history, past surgical history and problem list.  Review of Systems A comprehensive review of systems was negative.   Objective:    BP 117/76 mmHg  Pulse 80  Ht 5\' 10"  (1.778 m)  Wt 151 lb (68.493 kg)  BMI 21.67 kg/m2  LMP 09/20/2014 General appearance: alert, cooperative and appears stated age Head: Normocephalic, without obvious abnormality, atraumatic Neck: no adenopathy, supple, symmetrical, trachea midline and thyroid not enlarged, symmetric, no tenderness/mass/nodules Lungs: clear to auscultation bilaterally Breasts: normal appearance, no masses or tenderness Heart: regular rate and rhythm, S1, S2 normal, no murmur, click, rub or gallop Abdomen: soft, non-tender; bowel sounds normal; no masses,  no organomegaly Pelvic: cervix normal in appearance, external genitalia normal, no adnexal masses or tenderness, no cervical motion tenderness, uterus normal size, shape, and  consistency and vagina normal without discharge Extremities: extremities normal, atraumatic, no cyanosis or edema Pulses: 2+ and symmetric Skin: Skin color, texture, turgor normal. No rashes or lesions Lymph nodes: Cervical, supraclavicular, and axillary nodes normal. Neurologic: Grossly normal    Assessment:    Healthy female exam.      Plan:   Problem List Items Addressed This Visit    None    Visit Diagnoses    Screening for malignant neoplasm of cervix    -  Primary    Relevant Orders    Pap Lb, rfx HPV all pth    Encounter for routine gynecological examination             See After Visit Summary for Counseling Recommendations

## 2014-09-29 LAB — PAP LB, RFX HPV ALL PTH: PAP Smear Comment: 0

## 2014-12-07 ENCOUNTER — Other Ambulatory Visit (INDEPENDENT_AMBULATORY_CARE_PROVIDER_SITE_OTHER): Payer: 59 | Admitting: *Deleted

## 2014-12-07 DIAGNOSIS — M545 Low back pain, unspecified: Secondary | ICD-10-CM

## 2014-12-07 LAB — POCT URINALYSIS DIPSTICK
Bilirubin, UA: NEGATIVE
Glucose, UA: NEGATIVE
KETONES UA: NEGATIVE
LEUKOCYTES UA: NEGATIVE
NITRITE UA: NEGATIVE
PH UA: 5
PROTEIN UA: NEGATIVE
Spec Grav, UA: >=1.03
UROBILINOGEN UA: NEGATIVE

## 2014-12-07 NOTE — Progress Notes (Addendum)
Pt dropped off a urine specimen to check for UTI.  No signs of UTI today. Patient on cycle that is why there is blood in urine dip today.

## 2014-12-22 ENCOUNTER — Ambulatory Visit (INDEPENDENT_AMBULATORY_CARE_PROVIDER_SITE_OTHER): Payer: 59 | Admitting: Obstetrics and Gynecology

## 2014-12-22 ENCOUNTER — Encounter: Payer: Self-pay | Admitting: Obstetrics and Gynecology

## 2014-12-22 VITALS — BP 116/76 | HR 88 | Wt 154.0 lb

## 2014-12-22 DIAGNOSIS — R102 Pelvic and perineal pain: Secondary | ICD-10-CM

## 2014-12-22 DIAGNOSIS — M545 Low back pain, unspecified: Secondary | ICD-10-CM

## 2014-12-22 DIAGNOSIS — M5489 Other dorsalgia: Secondary | ICD-10-CM

## 2014-12-22 DIAGNOSIS — R309 Painful micturition, unspecified: Secondary | ICD-10-CM

## 2014-12-22 LAB — POCT URINALYSIS DIPSTICK
BILIRUBIN UA: NEGATIVE
Glucose, UA: NEGATIVE
KETONES UA: NEGATIVE
Leukocytes, UA: NEGATIVE
Nitrite, UA: NEGATIVE
PH UA: 6
PROTEIN UA: NEGATIVE
Urobilinogen, UA: NEGATIVE

## 2014-12-22 MED ORDER — CYCLOBENZAPRINE HCL 10 MG PO TABS: 10.0000 mg | ORAL_TABLET | Freq: Three times a day (TID) | ORAL | Status: DC | PRN

## 2014-12-22 NOTE — Progress Notes (Signed)
Patient ID: Victoria [name], female   DOB: 07/29/1981, 34 y.o.   MRN: [id number] 34 yo here for evaluation of lower back pain and pain in vagina. Patient reports onset of lower back pain approximately 1-2 weeks ago. She reports lifting heavy boxes around that time but is not certain she pulled a muscle. She describes the pain as a throbbing pain that is intermittent. It is alleviated by ibuprofen and heating pad. It is non radiating. Her vagina pain seems to have started around the same time and is located on the left lateral aspect of her vagina. She denies any abnormal bleeding or discharge. She denies any abnormal odor or pruritis. Patient also reports some urinary frequency without dysuria or urgency  Past Medical History  Diagnosis Date  . Dyspareunia   . Abnormal Pap smear of cervix    Past Surgical History  Procedure Laterality Date  . Tubal ligation    . Cryotherapy      AGE 55   Family History  Problem Relation Age of Onset  . Breast cancer Maternal Aunt   . Cancer Maternal Aunt     breast  . Heart attack Paternal Grandfather   . Heart disease Paternal Grandfather     heart atack  . Heart attack Paternal Grandmother   . Heart disease Paternal Grandmother     heart attack  . Hypertension Brother    History   Social History  . Marital Status: Married    Spouse Name: N/A  . Number of Children: N/A  . Years of Education: N/A   Occupational History  . Not on file.   Social History Main Topics  . Smoking status: Never Smoker   . Smokeless tobacco: Never Used  . Alcohol Use: Yes     Comment: OCCASIONALLY  . Drug Use: No  . Sexual Activity:    Partners: Male    Birth Control/ Protection: Surgical     Comment: tubaligation.   Other Topics Concern  . Not on file   Social History Narrative    Review of Systems See pertinent in HPI   GENERAL: Well-developed, well-nourished female in no acute distress.  BACK: no cva tenderness. Pain reproducible with deep  palpation of lower back on either side of spine ABDOMEN: Soft, nontender, nondistended. No organomegaly. PELVIC: Normal external female genitalia. Vagina is pink and rugated with an area of inflammation on left aspect of vaginal mucosa similar in appearance to a healing vaginal laceration.  Normal discharge. Normal appearing cervix. Uterus is normal in size. No adnexal mass or tenderness. EXTREMITIES: No cyanosis, clubbing, or edema, 2+ distal pulses.  A/P 34 yo with multiple complaints 1) back pain- Rx flexeril provided Encouraged the continued use of ibuprofen and heating pad  2) urinary frequency - Urine culture collected - patient will be contacted with any abnormal results  3) Vaginal pain - Pelvic rest recommended - wet prep collected - patient will be contacted with any abnormal results

## 2014-12-23 LAB — WET PREP, GENITAL
Clue Cells Wet Prep HPF POC: NONE SEEN
TRICH WET PREP: NONE SEEN
YEAST WET PREP: NONE SEEN

## 2014-12-23 LAB — URINE CULTURE

## 2015-02-23 DIAGNOSIS — E559 Vitamin D deficiency, unspecified: Secondary | ICD-10-CM | POA: Insufficient documentation

## 2015-02-23 DIAGNOSIS — K029 Dental caries, unspecified: Secondary | ICD-10-CM | POA: Insufficient documentation

## 2015-02-23 DIAGNOSIS — E7800 Pure hypercholesterolemia, unspecified: Secondary | ICD-10-CM | POA: Insufficient documentation

## 2015-03-03 ENCOUNTER — Ambulatory Visit (INDEPENDENT_AMBULATORY_CARE_PROVIDER_SITE_OTHER): Payer: 59 | Admitting: Physician Assistant

## 2015-03-03 ENCOUNTER — Encounter: Payer: Self-pay | Admitting: Physician Assistant

## 2015-03-03 VITALS — BP 108/64 | HR 72 | Temp 98.0°F | Resp 16 | Ht 66.5 in | Wt 153.0 lb

## 2015-03-03 DIAGNOSIS — E7800 Pure hypercholesterolemia, unspecified: Secondary | ICD-10-CM

## 2015-03-03 DIAGNOSIS — Z833 Family history of diabetes mellitus: Secondary | ICD-10-CM

## 2015-03-03 DIAGNOSIS — Z0000 Encounter for general adult medical examination without abnormal findings: Secondary | ICD-10-CM | POA: Diagnosis not present

## 2015-03-03 NOTE — Progress Notes (Signed)
Patient ID: Victoria [name], female   DOB: 03/07/1981, 34 y.o.   MRN: [id number]       [name] Female    DOB: 06/02/1981   34 y.o.   MRN: [id number] Visit Date: 03/03/2015  Today's Provider: Margaretann LovelessJennifer M Burnette, PA-C   Chief Complaint  Patient presents with  . Annual Exam   Subjective:    HPI Patient comes in today for her annual physical exam. She feels well with no complaints.  She did have her annual Pap smear done this year with her OB/GYN, and reports that it was normal. Her OB/GYN also does her breast exams. There is no family history of breast cancer. She has never had a colonoscopy. Her OB/GYN also does her digital rectal exam and reports that it was normal this year. There is no family history of colon cancer.  She does have a history of hypercholesterolemia and her lipids have not been checked in over a year. She also has a positive family history for type 2 diabetes mellitus.    Allergies  Allergen Reactions  . Bactrim   . Sulfa Antibiotics    Previous Medications   CYCLOBENZAPRINE (FLEXERIL) 10 MG TABLET    Take 1 tablet (10 mg total) by mouth every 8 (eight) hours as needed for muscle spasms.   FLUTICASONE (FLONASE) 50 MCG/ACT NASAL SPRAY    Place 2 sprays into the nose daily.   LORATADINE (CLARITIN) 10 MG TABLET    Take 1 tablet by mouth daily.    Review of Systems  Constitutional: Negative.   HENT: Positive for postnasal drip.        Has allergies.   Eyes: Negative.   Respiratory: Negative.   Cardiovascular: Negative.   Gastrointestinal: Negative.   Endocrine: Negative.   Genitourinary: Negative.   Musculoskeletal: Negative.   Skin: Negative.   Allergic/Immunologic: Negative.   Neurological: Negative.   Hematological: Negative.   Psychiatric/Behavioral: Negative.     History  Substance Use Topics  . Smoking status: Former Smoker -- 0.50 packs/day for 6 years    Types: Cigarettes    Quit date: 08/19/2004  . Smokeless tobacco: Never Used       Comment: quit in 2004  . Alcohol Use: 0.0 oz/week    0 Standard drinks or equivalent per week     Comment: OCCASIONALLY   Objective:   There were no vitals taken for this visit.  Physical Exam  Constitutional: She is oriented to person, place, and time. She appears well-developed and well-nourished. No distress.  HENT:  Head: Normocephalic and atraumatic.  Right Ear: External ear normal.  Left Ear: External ear normal.  Nose: Nose normal.  Mouth/Throat: Oropharynx is clear and moist. No oropharyngeal exudate.  Eyes: Conjunctivae and EOM are normal. Pupils are equal, round, and reactive to light. Right eye exhibits no discharge. Left eye exhibits no discharge. No scleral icterus.  Neck: Normal range of motion. Neck supple. No JVD present. No tracheal deviation present. No thyromegaly present.  Cardiovascular: Normal rate, regular rhythm, normal heart sounds and intact distal pulses.  Exam reveals no gallop and no friction rub.   No murmur heard. Pulmonary/Chest: Effort normal and breath sounds normal. No respiratory distress. She has no wheezes. She has no rales. She exhibits no tenderness.  Breast exam deferred to Ob/GYN  Abdominal: Soft. Bowel sounds are normal. She exhibits no distension and no mass. There is no tenderness. There is no rebound and no guarding.  Genitourinary:  Deferred  to OB/GYN (Dr. Larina Bras at [facility])  Musculoskeletal: Normal range of motion. She exhibits no edema or tenderness.  Lymphadenopathy:    She has no cervical adenopathy.  Neurological: She is alert and oriented to person, place, and time.  Skin: Skin is warm and dry. No rash noted. She is not diaphoretic.  Psychiatric: She has a normal mood and affect. Her behavior is normal. Judgment and thought content normal.  Vitals reviewed.       Assessment & Plan:     1. Routine general medical examination at a health care facility Will check labs and f/u pending labs. - Comprehensive metabolic  panel - CBC with Differential - TSH  2. Hypercholesterolemia without hypertriglyceridemia Will check labs and f/u pending labs. - Lipid panel  3. Family history of diabetes mellitus Will check labs and f/u pending labs. - Hemoglobin A1c       Victoria Loveless, PA-C  [facility]

## 2015-03-03 NOTE — Patient Instructions (Signed)
Health Maintenance Adopting a healthy lifestyle and getting preventive care can go a long way to promote health and wellness. Talk with your health care provider about what schedule of regular examinations is right for you. This is a good chance for you to check in with your provider about disease prevention and staying healthy. In between checkups, there are plenty of things you can do on your own. Experts have done a lot of research about which lifestyle changes and preventive measures are most likely to keep you healthy. Ask your health care provider for more information. WEIGHT AND DIET  Eat a healthy diet 1. Be sure to include plenty of vegetables, fruits, low-fat dairy products, and lean protein. 2. Do not eat a lot of foods high in solid fats, added sugars, or salt. 3. Get regular exercise. This is one of the most important things you can do for your health. 1. Most adults should exercise for at least 150 minutes each week. The exercise should increase your heart rate and make you sweat (moderate-intensity exercise). 2. Most adults should also do strengthening exercises at least twice a week. This is in addition to the moderate-intensity exercise.  Maintain a healthy weight 1. Body mass index (BMI) is a measurement that can be used to identify possible weight problems. It estimates body fat based on height and weight. Your health care provider can help determine your BMI and help you achieve or maintain a healthy weight. 2. For females 6 years of age and older:  1. A BMI below 18.5 is considered underweight. 2. A BMI of 18.5 to 24.9 is normal. 3. A BMI of 25 to 29.9 is considered overweight. 4. A BMI of 30 and above is considered obese.  Watch levels of cholesterol and blood lipids 1. You should start having your blood tested for lipids and cholesterol at 35 years of age, then have this test every 5 years. 2. You may need to have your cholesterol levels checked more often if: 1. Your  lipid or cholesterol levels are high. 2. You are older than 35 years of age. 3. You are at high risk for heart disease.  CANCER SCREENING   Lung Cancer 1. Lung cancer screening is recommended for adults 7-87 years old who are at high risk for lung cancer because of a history of smoking. 2. A yearly low-dose CT scan of the lungs is recommended for people who: 1. Currently smoke. 2. Have quit within the past 15 years. 3. Have at least a 30-pack-year history of smoking. A pack year is smoking an average of one pack of cigarettes a day for 1 year. 3. Yearly screening should continue until it has been 15 years since you quit. 4. Yearly screening should stop if you develop a health problem that would prevent you from having lung cancer treatment.  Breast Cancer  Practice breast self-awareness. This means understanding how your breasts normally appear and feel.  It also means doing regular breast self-exams. Let your health care provider know about any changes, no matter how small.  If you are in your 20s or 30s, you should have a clinical breast exam (CBE) by a health care provider every 1-3 years as part of a regular health exam.  If you are 30 or older, have a CBE every year. Also consider having a breast X-Madlock (mammogram) every year.  If you have a family history of breast cancer, talk to your health care provider about genetic screening.  If you are  at high risk for breast cancer, talk to your health care provider about having an MRI and a mammogram every year.  Breast cancer gene (BRCA) assessment is recommended for women who have family members with BRCA-related cancers. BRCA-related cancers include:  Breast.  Ovarian.  Tubal.  Peritoneal cancers.  Results of the assessment will determine the need for genetic counseling and BRCA1 and BRCA2 testing. Cervical Cancer Routine pelvic examinations to screen for cervical cancer are no longer recommended for nonpregnant women who  are considered low risk for cancer of the pelvic organs (ovaries, uterus, and vagina) and who do not have symptoms. A pelvic examination may be necessary if you have symptoms including those associated with pelvic infections. Ask your health care provider if a screening pelvic exam is right for you.   The Pap test is the screening test for cervical cancer for women who are considered at risk.  If you had a hysterectomy for a problem that was not cancer or a condition that could lead to cancer, then you no longer need Pap tests.  If you are older than 65 years, and you have had normal Pap tests for the past 10 years, you no longer need to have Pap tests.  If you have had past treatment for cervical cancer or a condition that could lead to cancer, you need Pap tests and screening for cancer for at least 20 years after your treatment.  If you no longer get a Pap test, assess your risk factors if they change (such as having a new sexual partner). This can affect whether you should start being screened again.  Some women have medical problems that increase their chance of getting cervical cancer. If this is the case for you, your health care provider may recommend more frequent screening and Pap tests.  The human papillomavirus (HPV) test is another test that may be used for cervical cancer screening. The HPV test looks for the virus that can cause cell changes in the cervix. The cells collected during the Pap test can be tested for HPV.  The HPV test can be used to screen women 2 years of age and older. Getting tested for HPV can extend the interval between normal Pap tests from three to five years.  An HPV test also should be used to screen women of any age who have unclear Pap test results.  After 35 years of age, women should have HPV testing as often as Pap tests.  Colorectal Cancer  This type of cancer can be detected and often prevented.  Routine colorectal cancer screening usually  begins at 35 years of age and continues through 35 years of age.  Your health care provider may recommend screening at an earlier age if you have risk factors for colon cancer.  Your health care provider may also recommend using home test kits to check for hidden blood in the stool.  A small camera at the end of a tube can be used to examine your colon directly (sigmoidoscopy or colonoscopy). This is done to check for the earliest forms of colorectal cancer.  Routine screening usually begins at age 57.  Direct examination of the colon should be repeated every 5-10 years through 35 years of age. However, you may need to be screened more often if early forms of precancerous polyps or small growths are found. Skin Cancer  Check your skin from head to toe regularly.  Tell your health care provider about any new moles or changes in  moles, especially if there is a change in a mole's shape or color.  Also tell your health care provider if you have a mole that is larger than the size of a pencil eraser.  Always use sunscreen. Apply sunscreen liberally and repeatedly throughout the day.  Protect yourself by wearing long sleeves, pants, a wide-brimmed hat, and sunglasses whenever you are outside. HEART DISEASE, DIABETES, AND HIGH BLOOD PRESSURE   Have your blood pressure checked at least every 1-2 years. High blood pressure causes heart disease and increases the risk of stroke.  If you are between 32 years and 30 years old, ask your health care provider if you should take aspirin to prevent strokes.  Have regular diabetes screenings. This involves taking a blood sample to check your fasting blood sugar level.  If you are at a normal weight and have a low risk for diabetes, have this test once every three years after 35 years of age.  If you are overweight and have a high risk for diabetes, consider being tested at a younger age or more often. PREVENTING INFECTION  Hepatitis B  If you have a  higher risk for hepatitis B, you should be screened for this virus. You are considered at high risk for hepatitis B if:  You were born in a country where hepatitis B is common. Ask your health care provider which countries are considered high risk.  Your parents were born in a high-risk country, and you have not been immunized against hepatitis B (hepatitis B vaccine).  You have HIV or AIDS.  You use needles to inject street drugs.  You live with someone who has hepatitis B.  You have had sex with someone who has hepatitis B.  You get hemodialysis treatment.  You take certain medicines for conditions, including cancer, organ transplantation, and autoimmune conditions. Hepatitis C  Blood testing is recommended for:  Everyone born from [birth year] through 1966.  Anyone with known risk factors for hepatitis C. Sexually transmitted infections (STIs)  You should be screened for sexually transmitted infections (STIs) including gonorrhea and chlamydia if:  You are sexually active and are younger than 35 years of age.  You are older than 35 years of age and your health care provider tells you that you are at risk for this type of infection.  Your sexual activity has changed since you were last screened and you are at an increased risk for chlamydia or gonorrhea. Ask your health care provider if you are at risk.  If you do not have HIV, but are at risk, it may be recommended that you take a prescription medicine daily to prevent HIV infection. This is called pre-exposure prophylaxis (PrEP). You are considered at risk if:  You are sexually active and do not regularly use condoms or know the HIV status of your partner(s).  You take drugs by injection.  You are sexually active with a partner who has HIV. Talk with your health care provider about whether you are at high risk of being infected with HIV. If you choose to begin PrEP, you should first be tested for HIV. You should then be tested  every 3 months for as long as you are taking PrEP.  PREGNANCY   If you are premenopausal and you may become pregnant, ask your health care provider about preconception counseling.  If you may become pregnant, take 400 to 800 micrograms (mcg) of folic acid every day.  If you want to prevent pregnancy, talk to your  health care provider about birth control (contraception). OSTEOPOROSIS AND MENOPAUSE   Osteoporosis is a disease in which the bones lose minerals and strength with aging. This can result in serious bone fractures. Your risk for osteoporosis can be identified using a bone density scan.  If you are 34 years of age or older, or if you are at risk for osteoporosis and fractures, ask your health care provider if you should be screened.  Ask your health care provider whether you should take a calcium or vitamin D supplement to lower your risk for osteoporosis.  Menopause may have certain physical symptoms and risks.  Hormone replacement therapy may reduce some of these symptoms and risks. Talk to your health care provider about whether hormone replacement therapy is right for you.  HOME CARE INSTRUCTIONS   Schedule regular health, dental, and eye exams.  Stay current with your immunizations.   Do not use any tobacco products including cigarettes, chewing tobacco, or electronic cigarettes.  If you are pregnant, do not drink alcohol.  If you are breastfeeding, limit how much and how often you drink alcohol.  Limit alcohol intake to no more than 1 drink per day for nonpregnant women. One drink equals 12 ounces of beer, 5 ounces of wine, or 1 ounces of hard liquor.  Do not use street drugs.  Do not share needles.  Ask your health care provider for help if you need support or information about quitting drugs.  Tell your health care provider if you often feel depressed.  Tell your health care provider if you have ever been abused or do not feel safe at home. Document  Released: 02/18/2011 Document Revised: 12/20/2013 Document Reviewed: 07/07/2013 [facility] Patient Information 2015 [place], [place]. This information is not intended to replace advice given to you by your health care provider. Make sure you discuss any questions you have with your health care provider.     Why follow it? Research shows. . Those who follow the Mediterranean diet have a reduced risk of heart disease  . The diet is associated with a reduced incidence of Parkinson's and Alzheimer's diseases . People following the diet may have longer life expectancies and lower rates of chronic diseases  . The Dietary Guidelines for Americans recommends the Mediterranean diet as an eating plan to promote health and prevent disease  What Is the Mediterranean Diet?  . Healthy eating plan based on typical foods and recipes of Mediterranean-style cooking . The diet is primarily a plant based diet; these foods should make up a majority of meals   Starches - Plant based foods should make up a majority of meals - They are an important sources of vitamins, minerals, energy, antioxidants, and fiber - Choose whole grains, foods high in fiber and minimally processed items  - Typical grain sources include wheat, oats, barley, corn, brown rice, bulgar, farro, millet, polenta, couscous  - Various types of beans include chickpeas, lentils, fava beans, black beans, white beans   Fruits  Veggies - Large quantities of antioxidant rich fruits & veggies; 6 or more servings  - Vegetables can be eaten raw or lightly drizzled with oil and cooked  - Vegetables common to the traditional Mediterranean Diet include: artichokes, arugula, beets, broccoli, brussel sprouts, cabbage, carrots, celery, collard greens, cucumbers, eggplant, kale, leeks, lemons, lettuce, mushrooms, okra, onions, peas, peppers, potatoes, pumpkin, radishes, rutabaga, shallots, spinach, sweet potatoes, turnips, zucchini - Fruits common to the Mediterranean  Diet include: apples, apricots, avocados, cherries, clementines, dates, figs, grapefruits,  grapes, melons, nectarines, oranges, peaches, pears, pomegranates, strawberries, tangerines  Fats - Replace butter and margarine with healthy oils, such as olive oil, canola oil, and tahini  - Limit nuts to no more than a handful a day  - Nuts include walnuts, almonds, pecans, pistachios, pine nuts  - Limit or avoid candied, honey roasted or heavily salted nuts - Olives are central to the Mediterranean diet - can be eaten whole or used in a variety of dishes   Meats Protein - Limiting red meat: no more than a few times a month - When eating red meat: choose lean cuts and keep the portion to the size of deck of cards - Eggs: approx. 0 to 4 times a week  - Fish and lean poultry: at least 2 a week  - Healthy protein sources include, chicken, [place], lean beef, lamb - Increase intake of seafood such as tuna, salmon, trout, mackerel, shrimp, scallops - Avoid or limit high fat processed meats such as sausage and bacon  Dairy - Include moderate amounts of low fat dairy products  - Focus on healthy dairy such as fat free yogurt, skim milk, low or reduced fat cheese - Limit dairy products higher in fat such as whole or 2% milk, cheese, ice cream  Alcohol - Moderate amounts of red wine is ok  - No more than 5 oz daily for women (all ages) and men older than age 74  - No more than 10 oz of wine daily for men younger than 44  Other - Limit sweets and other desserts  - Use herbs and spices instead of salt to flavor foods  - Herbs and spices common to the traditional Mediterranean Diet include: basil, bay leaves, chives, cloves, cumin, fennel, garlic, lavender, marjoram, mint, oregano, parsley, pepper, rosemary, sage, savory, sumac, tarragon, thyme   It's not just a diet, it's a lifestyle:  . The Mediterranean diet includes lifestyle factors typical of those in the region  . Foods, drinks and meals are best eaten  with others and savored . Daily physical activity is important for overall good health . This could be strenuous exercise like running and aerobics . This could also be more leisurely activities such as walking, housework, yard-work, or taking the stairs . Moderation is the key; a balanced and healthy diet accommodates most foods and drinks . Consider portion sizes and frequency of consumption of certain foods   Meal Ideas & Options:  . Breakfast:  o Whole wheat toast or whole wheat English muffins with peanut butter & hard boiled egg o Steel cut oats topped with apples & cinnamon and skim milk  o Fresh fruit: banana, strawberries, melon, berries, peaches  o Smoothies: strawberries, bananas, greek yogurt, peanut butter o Low fat greek yogurt with blueberries and granola  o Egg white omelet with spinach and mushrooms o Breakfast couscous: whole wheat couscous, apricots, skim milk, cranberries  . Sandwiches:  o Hummus and grilled vegetables (peppers, zucchini, squash) on whole wheat bread   o Grilled chicken on whole wheat pita with lettuce, tomatoes, cucumbers or tzatziki  o Tuna salad on whole wheat bread: tuna salad made with greek yogurt, olives, red peppers, capers, green onions o Garlic rosemary lamb pita: lamb sauted with garlic, rosemary, salt & pepper; add lettuce, cucumber, greek yogurt to pita - flavor with lemon juice and black pepper  . Seafood:  o Mediterranean grilled salmon, seasoned with garlic, basil, parsley, lemon juice and black pepper o Shrimp, lemon, and spinach  whole-grain pasta salad made with low fat greek yogurt  o Seared scallops with lemon orzo  o Seared tuna steaks seasoned salt, pepper, coriander topped with tomato mixture of olives, tomatoes, olive oil, minced garlic, parsley, green onions and cappers  . Meats:  o Herbed greek chicken salad with kalamata olives, cucumber, feta  o Red bell peppers stuffed with spinach, bulgur, lean ground beef (or lentils) &  topped with feta   o Kebabs: skewers of chicken, tomatoes, onions, zucchini, squash  o [place] burgers: made with red onions, mint, dill, lemon juice, feta cheese topped with roasted red peppers . Vegetarian o Cucumber salad: cucumbers, artichoke hearts, celery, red onion, feta cheese, tossed in olive oil & lemon juice  o Hummus and whole grain pita points with a greek salad (lettuce, tomato, feta, olives, cucumbers, red onion) o Lentil soup with celery, carrots made with vegetable broth, garlic, salt and pepper  o Tabouli salad: parsley, bulgur, mint, scallions, cucumbers, tomato, radishes, lemon juice, olive oil, salt and pepper.      American Heart Association (AHA) Exercise Recommendation  Being physically active is important to prevent heart disease and stroke, the nation's No. 1and No. 5killers. To improve overall cardiovascular health, we suggest at least 150 minutes per week of moderate exercise or 75 minutes per week of vigorous exercise (or a combination of moderate and vigorous activity). Thirty minutes a day, five times a week is an easy goal to remember. You will also experience benefits even if you divide your time into two or three segments of 10 to 15 minutes per day.  For people who would benefit from lowering their blood pressure or cholesterol, we recommend 40 minutes of aerobic exercise of moderate to vigorous intensity three to four times a week to lower the risk for heart attack and stroke.  Physical activity is anything that makes you move your body and burn calories.  This includes things like climbing stairs or playing sports. Aerobic exercises benefit your heart, and include walking, jogging, swimming or biking. Strength and stretching exercises are best for overall stamina and flexibility.  The simplest, positive change you can make to effectively improve your heart health is to start walking. It's enjoyable, free, easy, social and great exercise. A walking program is  flexible and boasts high success rates because people can stick with it. It's easy for walking to become a regular and satisfying part of life.   For Overall Cardiovascular Health:  At least 30 minutes of moderate-intensity aerobic activity at least 5 days per week for a total of 150  OR   At least 25 minutes of vigorous aerobic activity at least 3 days per week for a total of 75 minutes; or a combination of moderate- and vigorous-intensity aerobic activity  AND   Moderate- to high-intensity muscle-strengthening activity at least 2 days per week for additional health benefits.  For Lowering Blood Pressure and Cholesterol  An average 40 minutes of moderate- to vigorous-intensity aerobic activity 3 or 4 times per week  What if I can't make it to the time goal? Something is always better than nothing! And everyone has to start somewhere. Even if you've been sedentary for years, today is the day you can begin to make healthy changes in your life. If you don't think you'll make it for 30 or 40 minutes, set a reachable goal for today. You can work up toward your overall goal by increasing your time as you get stronger. Don't let all-or-nothing thinking  rob you of doing what you can every day.  Source:http://www.heart.org

## 2015-03-21 ENCOUNTER — Encounter: Payer: Self-pay | Admitting: Obstetrics & Gynecology

## 2015-03-21 ENCOUNTER — Ambulatory Visit (INDEPENDENT_AMBULATORY_CARE_PROVIDER_SITE_OTHER): Payer: 59 | Admitting: Obstetrics & Gynecology

## 2015-03-21 VITALS — BP 117/82 | HR 91 | Ht 66.5 in | Wt 152.0 lb

## 2015-03-21 DIAGNOSIS — N200 Calculus of kidney: Secondary | ICD-10-CM

## 2015-03-21 DIAGNOSIS — R300 Dysuria: Secondary | ICD-10-CM | POA: Diagnosis not present

## 2015-03-21 LAB — POCT URINALYSIS DIPSTICK
BILIRUBIN UA: NEGATIVE
GLUCOSE UA: NEGATIVE
KETONES UA: NEGATIVE
NITRITE UA: NEGATIVE
PH UA: 5
Protein, UA: NEGATIVE
SPEC GRAV UA: 1.01
Urobilinogen, UA: NEGATIVE

## 2015-03-21 MED ORDER — IBUPROFEN 800 MG PO TABS: 800.0000 mg | ORAL_TABLET | Freq: Three times a day (TID) | ORAL | Status: DC | PRN

## 2015-03-21 MED ORDER — OXYCODONE-ACETAMINOPHEN 5-325 MG PO TABS: 1.0000 | ORAL_TABLET | Freq: Four times a day (QID) | ORAL | Status: DC | PRN

## 2015-03-21 MED ORDER — PHENAZOPYRIDINE HCL 200 MG PO TABS: 200.0000 mg | ORAL_TABLET | Freq: Three times a day (TID) | ORAL | Status: DC | PRN

## 2015-03-21 NOTE — Patient Instructions (Signed)

## 2015-03-21 NOTE — Progress Notes (Signed)
CLINIC ENCOUNTER NOTE  History:  34 y.o. [id number] here today for back pain and recent diagnosis of kidney stone.  She was seen at [facility] on 02/09/15 and diagnosed with ureteral stone with hydronephrosis, told it should pass within a few weeks.  She does feel the stone has moved from back towards lower abdomen, but not passed.  Reports associated spasms that can be moderate-severe in intensity.  Has appointment with Urology but it is not until 04/19/15. She does not like narcotics but would be interested in something to help with discomfort until her appointment. She denies any fevers, abnormal vaginal discharge, bleeding, pelvic pain or other concerns.   Past Medical History  Diagnosis Date  . Dyspareunia   . Abnormal Pap smear of cervix     Past Surgical History  Procedure Laterality Date  . Tubal ligation    . Cryotherapy      AGE 1    Current outpatient prescriptions:  .  cyclobenzaprine (FLEXERIL) 10 MG tablet, Take 1 tablet (10 mg total) by mouth every 8 (eight) hours as needed for muscle spasms., Disp: 30 tablet, Rfl: 1 .  fluticasone (FLONASE) 50 MCG/ACT nasal spray, Place 2 sprays into the nose daily., Disp: , Rfl:  .  loratadine (CLARITIN) 10 MG tablet, Take 1 tablet by mouth daily., Disp: , Rfl:  .  ondansetron (ZOFRAN) 4 MG tablet, Take 4 mg by mouth every 8 (eight) hours as needed for nausea or vomiting., Disp: , Rfl:  .  oxyCODONE-acetaminophen (PERCOCET/ROXICET) 5-325 MG per tablet, Take 1-2 tablets by mouth every 6 (six) hours as needed., Disp: 30 tablet, Rfl: 0 .  tamsulosin (FLOMAX) 0.4 MG CAPS capsule, Take 0.4 mg by mouth., Disp: , Rfl:  .  ibuprofen (ADVIL,MOTRIN) 800 MG tablet, Take 1 tablet (800 mg total) by mouth 3 (three) times daily with meals as needed for headache or moderate pain., Disp: 30 tablet, Rfl: 3 .  phenazopyridine (PYRIDIUM) 200 MG tablet, Take 1 tablet (200 mg total) by mouth 3 (three) times daily as needed for pain (urethral spasm)., Disp: 10  tablet, Rfl: 3   The following portions of the patient's history were reviewed and updated as appropriate: allergies, current medications, past family history, past medical history, past social history, past surgical history and problem list.   Health Maintenance:  Normal pap and negative HRHPV on 11/03/2012.     Review of Systems:  Pertinent items are noted in HPI. Comprehensive review of systems was otherwise negative.  Objective:  Physical Exam BP 117/82 mmHg  Pulse 91  Ht 5' 6.5" (1.689 m)  Wt 152 lb (68.947 kg)  BMI 24.17 kg/m2  LMP 03/02/2015 CONSTITUTIONAL: Well-developed, well-nourished female in no acute distress.  HENT:  Normocephalic, atraumatic. External right and left ear normal. Oropharynx is clear and moist EYES: Conjunctivae and EOM are normal. Pupils are equal, round, and reactive to light. No scleral icterus.  NECK: Normal range of motion, supple, no masses SKIN: Skin is warm and dry. No rash noted. Not diaphoretic. No erythema. No pallor. NEUROLGIC: Alert and oriented to person, place, and time. Normal reflexes, muscle tone coordination. No cranial nerve deficit noted. PSYCHIATRIC: Normal mood and affect. Normal behavior. Normal judgment and thought content. CARDIOVASCULAR: Normal heart rate noted RESPIRATORY: Effort and breath sounds normal, no problems with respiration noted ABDOMEN: Soft, no distention noted, mild LLQ tenderness to palpation, no rebound or guarding PELVIC: Deferred MUSCULOSKELETAL: Normal range of motion. No edema noted.  Labs and Imaging 02/10/15 CT  abdomen and pelvis without contrastat [facility] (copied via Care Everywhere)  CLINICAL INDICATION: 34 Year Old (F): CALCULUS OF URETER  COMPARISON: Concurrent endovaginal ultrasound..  TECHNIQUE: A spiral CT scan was obtained without IV contrast from the lung bases to the pubic symphysis.Images were reconstructed in the axial plane. Coronal and sagittal reformatted images were also provided for  further evaluation.  FINDINGS:   The lung bases are clear.  Please note, the below description of the abdominal viscera refers to the non-contrast enhanced appearance and evaluation is limited by lack of IV contrast.   The partially visualized liver is unremarkable.   The partially visualized spleen is unremarkable.   The gallbladder is unremarkable.   The pancreas is unremarkable.   The adrenal glands are unremarkable.  The left kidney is mildly enlarged with perinephric stranding. There is moderate hydroureter nephrosis and periureteral stranding to the level of the left UVJ. 7 mm left UVJ stone.  The right kidney is unremarkable.There is no perinephric stranding.The right ureter is well visualized throughout its course. There is a tiny less than 3 mm inferior pole renal stone.  The bladder is unremarkable.No bladder calculi are identified. Right pelvic phleboliths are identified.  The visualized loops of large and small bowel are unremarkable. The appendix is visualized, without evidence of appendicitis.There is no evidence of bowel obstruction.  Normal-appearing uterus. 2 cm right adnexal cyst was better seen on recent ultrasound.  The aorta and its branch vessels are normal in caliber. The IVC is of normal caliber.   There is no abdominal or pelvic lymphadenopathy by size criteria. There is no free intraperitoneal air.There is no free pelvic fluid.  There are no acute osseous abnormalities or worrisome osseous lesions.  IMPRESSION:  -- 7 mm left UVJ obstructing/passing stone with moderate left hydroureteronephrosis.  -- Nonobstructing right nephrolithiasis.  02/09/15 [place] ENDOVAGINAL (NON-OB) at [facility] (copied via Care Everywhere)   CLINICAL INDICATION: 34 Year Old (F): Back pain. COMPARISON: None. FINDINGS: The uterus measured 9.3 x 4.5 x 5.5 cm in size. No focal myometrial mass was seen. The endometrium measured 10.6 Mm. The right ovary measured 4.2 x 2.0 x 3.6  cm and the left ovary measured 2.7 x 1.4 x 1.4 cm. 2.3 cm right ovariean cyst. Small cystic areas were seen within both ovaries compatible with follicles. Appropriate flow is seen in both ovaries with color and spectral doppler. No abnormal pelvic fluid was seen. Calcified 9 mm structure in the left adnexa with surrounding tubular structure may reflect ureteral stone. IMPRESSION: 1. Possible 9 mm left ureteral stone. 2. 2.3 cm simple physiologic right ovarian cyst.   Results for orders placed or performed in visit on 03/21/15 (from the past 24 hour(s))  POCT Urinalysis Dipstick     Status: Abnormal   Collection Time: 03/21/15  1:23 PM  Result Value Ref Range   Color, UA yellow    Clarity, UA clear    Glucose, UA neg    Bilirubin, UA neg    Ketones, UA neg    Spec Grav, UA 1.010    Blood, UA moderate    pH, UA 5.0    Protein, UA neg    Urobilinogen, UA negative    Nitrite, UA neg    Leukocytes, UA small (1+) (A) Negative  Urine culture sent   Assessment & Plan:  Imaginig studies reviewed with patient. Given persistence of symptoms and concern about not passing the stone since 02/09/15; will obtain another renal ultrasound to evaluate the stone  and hydronephrosis; ensure there is no obstruction that will need urgent urologic intervention.  In the meantime, she will continue Flomax, Percocet and Zofran as needed.  Advised to take NSAIDs for pain (Ibuprofen prescribed); Pyridium also ordered for bladder/ureteral spasms.  Will follow up urine culture.  Please refer to After Visit Summary for other counseling recommendations.    Total face-to-face time with patient: 25 minutes. Over 50% of encounter was spent on counseling and coordination of care.   Jaynie Collins, MD, FACOG Attending Obstetrician & Gynecologist Center for [employer], [facility]

## 2015-03-23 LAB — URINE CULTURE, OB REFLEX

## 2015-03-23 LAB — CULTURE, OB URINE

## 2015-03-24 ENCOUNTER — Ambulatory Visit
Admission: RE | Admit: 2015-03-24 | Discharge: 2015-03-24 | Disposition: A | Discharge status: Home / Self Care | Payer: 59 | Source: Ambulatory Visit | Attending: Obstetrics & Gynecology | Admitting: Obstetrics & Gynecology

## 2015-03-24 DIAGNOSIS — N200 Calculus of kidney: Secondary | ICD-10-CM

## 2015-03-24 NOTE — Progress Notes (Signed)
Quick Note:  Patient was called with results of renal ultrasound that showed 8 mm obstructing stone at UVJ, no ureteral jet, associated moderate hydronephrosis. Advised to follow up with Urology as soon as possible, given obstruction from the stone and potential for worsening hydronephrosis/renal injury. She will contact her [facility] urologist and attempt to be seen sooner; was also told to try [facility]. Advised to go to ED for worsening symptoms.  Tereso Newcomer, MD 03/24/2015 4:43 PM  ______

## 2015-03-27 ENCOUNTER — Other Ambulatory Visit: Payer: Self-pay | Admitting: Urology

## 2015-03-29 ENCOUNTER — Encounter (HOSPITAL_COMMUNITY): Payer: Self-pay

## 2015-03-30 ENCOUNTER — Encounter (HOSPITAL_COMMUNITY): Payer: Self-pay | Admitting: *Deleted

## 2015-04-02 NOTE — H&P (Signed)
  H&P  Chief Complaint: Kidney stone  History of Present Illness: Victoria [name] is a 34 y.o. year old female who presents for ESL of a persistently symptomatic 7 mm left distal ureteral stone.  Past Medical History  Diagnosis Date  . Dyspareunia   . Abnormal Pap smear of cervix   . Calculus of kidney     left distal ureteral stone    Past Surgical History  Procedure Laterality Date  . Tubal ligation    . Cryotherapy      AGE 58    Home Medications:  No prescriptions prior to admission    Allergies:  Allergies  Allergen Reactions  . Bactrim   . Sulfa Antibiotics     Family History  Problem Relation Age of Onset  . Breast cancer Maternal Aunt   . Cancer Maternal Aunt     breast  . Heart attack Paternal Grandfather   . Heart disease Paternal Grandfather     heart atack  . Heart attack Paternal Grandmother   . Heart disease Paternal Grandmother     heart attack  . Hypertension Brother   . Alcohol abuse Brother   . Pancreatitis Brother     Social History:  reports that she quit smoking about 10 years ago. Her smoking use included Cigarettes. She has a 3 pack-year smoking history. She has never used smokeless tobacco. She reports that she drinks alcohol. She reports that she does not use illicit drugs.  ROS: Genitourinary, constitutional, skin, eye, otolaryngeal, hematologic/lymphatic, cardiovascular, pulmonary, endocrine, musculoskeletal, gastrointestinal, neurological and psychiatric system(s) were reviewed and pertinent findings if present are noted and are otherwise negative.  Gastrointestinal:1  nausea1 .  Musculoskeletal:1  back pain1 . .  Physical Exam:  Vital signs in last 24 hours:   General:  Alert and oriented, No acute distress HEENT: Normocephalic, atraumatic Neck: No JVD or lymphadenopathy Cardiovascular: Regular rate and rhythm Lungs: Clear bilaterally Abdomen: Soft, nontender, nondistended, no abdominal masses Back: No CVA  tenderness Extremities: No edema Neurologic: Grossly intact  Laboratory Data:  No results found for this or any previous visit (from the past 24 hour(s)). No results found for this or any previous visit (from the past 240 hour(s)). Creatinine: No results for input(s): CREATININE in the last 168 hours.  Radiologic Imaging: No results found.  Impression/Assessment:  7 mm left distal ureteral stone  Plan:  ESL of left distal ureteral stone  Arlynn Stare M 04/02/2015, 5:14 PM  Bertram Millard. Capitola Ladson MD

## 2015-04-03 ENCOUNTER — Encounter (HOSPITAL_COMMUNITY)
Admission: RE | Disposition: A | Discharge status: Home / Self Care | Payer: Self-pay | Source: Ambulatory Visit | Attending: Urology

## 2015-04-03 ENCOUNTER — Ambulatory Visit (HOSPITAL_COMMUNITY)
Admission: RE | Admit: 2015-04-03 | Discharge: 2015-04-03 | Disposition: A | Discharge status: Home / Self Care | Payer: 59 | Source: Ambulatory Visit | Attending: Urology | Admitting: Urology

## 2015-04-03 ENCOUNTER — Ambulatory Visit (HOSPITAL_COMMUNITY): Payer: 59

## 2015-04-03 ENCOUNTER — Encounter (HOSPITAL_COMMUNITY): Payer: Self-pay | Admitting: General Practice

## 2015-04-03 DIAGNOSIS — N201 Calculus of ureter: Secondary | ICD-10-CM | POA: Insufficient documentation

## 2015-04-03 DIAGNOSIS — Z87891 Personal history of nicotine dependence: Secondary | ICD-10-CM | POA: Insufficient documentation

## 2015-04-03 HISTORY — DX: Calculus of kidney: N20.0

## 2015-04-03 SURGERY — LITHOTRIPSY, ESWL
Anesthesia: LOCAL | Laterality: Left

## 2015-04-03 MED ORDER — CIPROFLOXACIN HCL 500 MG PO TABS
500.0000 mg | ORAL_TABLET | ORAL | Status: AC
  Administered 2015-04-03: 500 mg via ORAL
  Filled 2015-04-03: qty 1

## 2015-04-03 MED ORDER — ONDANSETRON HCL 4 MG/2ML IJ SOLN
4.0000 mg | Freq: Once | INTRAMUSCULAR | Status: AC
  Administered 2015-04-03: 4 mg via INTRAVENOUS
  Filled 2015-04-03: qty 2

## 2015-04-03 MED ORDER — SODIUM CHLORIDE 0.9 % IV SOLN
INTRAVENOUS | Status: DC
  Administered 2015-04-03: 08:00:00 via INTRAVENOUS

## 2015-04-03 MED ORDER — DIPHENHYDRAMINE HCL 25 MG PO CAPS
25.0000 mg | ORAL_CAPSULE | ORAL | Status: AC
  Administered 2015-04-03: 25 mg via ORAL
  Filled 2015-04-03: qty 1

## 2015-04-03 MED ORDER — DIAZEPAM 5 MG PO TABS
10.0000 mg | ORAL_TABLET | ORAL | Status: AC
  Administered 2015-04-03: 10 mg via ORAL
  Filled 2015-04-03: qty 2

## 2015-04-03 NOTE — Op Note (Signed)
See [facility] OP note scanned into chart. 

## 2015-04-03 NOTE — Discharge Instructions (Signed)
See [facility] discharge instructions in chart.  

## 2015-04-17 ENCOUNTER — Encounter: Payer: Self-pay | Admitting: *Deleted

## 2015-08-03 ENCOUNTER — Ambulatory Visit (INDEPENDENT_AMBULATORY_CARE_PROVIDER_SITE_OTHER): Payer: 59 | Admitting: Physician Assistant

## 2015-08-03 ENCOUNTER — Encounter: Payer: Self-pay | Admitting: Physician Assistant

## 2015-08-03 VITALS — BP 116/60 | HR 97 | Temp 98.8°F | Resp 16 | Wt 161.2 lb

## 2015-08-03 DIAGNOSIS — R059 Cough, unspecified: Secondary | ICD-10-CM

## 2015-08-03 DIAGNOSIS — J0100 Acute maxillary sinusitis, unspecified: Secondary | ICD-10-CM | POA: Diagnosis not present

## 2015-08-03 DIAGNOSIS — R05 Cough: Secondary | ICD-10-CM

## 2015-08-03 DIAGNOSIS — J020 Streptococcal pharyngitis: Secondary | ICD-10-CM | POA: Diagnosis not present

## 2015-08-03 MED ORDER — AMOXICILLIN 875 MG PO TABS: 875.0000 mg | ORAL_TABLET | Freq: Two times a day (BID) | ORAL | Status: DC

## 2015-08-03 MED ORDER — HYDROCODONE-HOMATROPINE 5-1.5 MG/5ML PO SYRP: 5.0000 mL | ORAL_SOLUTION | Freq: Three times a day (TID) | ORAL | Status: DC | PRN

## 2015-08-03 NOTE — Patient Instructions (Signed)
Strep Throat Strep throat is a bacterial infection of the throat. Your health care provider may call the infection tonsillitis or pharyngitis, depending on whether there is swelling in the tonsils or at the back of the throat. Strep throat is most common during the cold months of the year in children who are 5-35 years of age, but it can happen during any season in people of any age. This infection is spread from person to person (contagious) through coughing, sneezing, or close contact. CAUSES Strep throat is caused by the bacteria called Streptococcus pyogenes. RISK FACTORS This condition is more likely to develop in:  People who spend time in crowded places where the infection can spread easily.  People who have close contact with someone who has strep throat. SYMPTOMS Symptoms of this condition include:  Fever or chills.   Redness, swelling, or pain in the tonsils or throat.  Pain or difficulty when swallowing.  White or yellow spots on the tonsils or throat.  Swollen, tender glands in the neck or under the jaw.  Red rash all over the body (rare). DIAGNOSIS This condition is diagnosed by performing a rapid strep test or by taking a swab of your throat (throat culture test). Results from a rapid strep test are usually ready in a few minutes, but throat culture test results are available after one or two days. TREATMENT This condition is treated with antibiotic medicine. HOME CARE INSTRUCTIONS Medicines  Take over-the-counter and prescription medicines only as told by your health care provider.  Take your antibiotic as told by your health care provider. Do not stop taking the antibiotic even if you start to feel better.  Have family members who also have a sore throat or fever tested for strep throat. They may need antibiotics if they have the strep infection. Eating and Drinking  Do not share food, drinking cups, or personal items that could cause the infection to spread to  other people.  If swallowing is difficult, try eating soft foods until your sore throat feels better.  Drink enough fluid to keep your urine clear or pale yellow. General Instructions  Gargle with a salt-water mixture 3-4 times per day or as needed. To make a salt-water mixture, completely dissolve -1 tsp of salt in 1 cup of warm water.  Make sure that all household members wash their hands well.  Get plenty of rest.  Stay home from school or work until you have been taking antibiotics for 24 hours.  Keep all follow-up visits as told by your health care provider. This is important. SEEK MEDICAL CARE IF:  The glands in your neck continue to get bigger.  You develop a rash, cough, or earache.  You cough up a thick liquid that is green, yellow-brown, or bloody.  You have pain or discomfort that does not get better with medicine.  Your problems seem to be getting worse rather than better.  You have a fever. SEEK IMMEDIATE MEDICAL CARE IF:  You have new symptoms, such as vomiting, severe headache, stiff or painful neck, chest pain, or shortness of breath.  You have severe throat pain, drooling, or changes in your voice.  You have swelling of the neck, or the skin on the neck becomes red and tender.  You have signs of dehydration, such as fatigue, dry mouth, and decreased urination.  You become increasingly sleepy, or you cannot wake up completely.  Your joints become red or painful.   This information is not intended to replace   advice given to you by your health care provider. Make sure you discuss any questions you have with your health care provider.   Document Released: 08/02/2000 Document Revised: 04/26/2015 Document Reviewed: 11/28/2014 Elsevier Interactive Patient Education 2016 Elsevier Inc.  Sinusitis, Adult Sinusitis is redness, soreness, and inflammation of the paranasal sinuses. Paranasal sinuses are air pockets within the bones of your face. They are located  beneath your eyes, in the middle of your forehead, and above your eyes. In healthy paranasal sinuses, mucus is able to drain out, and air is able to circulate through them by way of your nose. However, when your paranasal sinuses are inflamed, mucus and air can become trapped. This can allow bacteria and other germs to grow and cause infection. Sinusitis can develop quickly and last only a short time (acute) or continue over a long period (chronic). Sinusitis that lasts for more than 12 weeks is considered chronic. CAUSES Causes of sinusitis include:  Allergies.  Structural abnormalities, such as displacement of the cartilage that separates your nostrils (deviated septum), which can decrease the air flow through your nose and sinuses and affect sinus drainage.  Functional abnormalities, such as when the small hairs (cilia) that line your sinuses and help remove mucus do not work properly or are not present. SIGNS AND SYMPTOMS Symptoms of acute and chronic sinusitis are the same. The primary symptoms are pain and pressure around the affected sinuses. Other symptoms include:  Upper toothache.  Earache.  Headache.  Bad breath.  Decreased sense of smell and taste.  A cough, which worsens when you are lying flat.  Fatigue.  Fever.  Thick drainage from your nose, which often is green and may contain pus (purulent).  Swelling and warmth over the affected sinuses. DIAGNOSIS Your health care provider will perform a physical exam. During your exam, your health care provider may perform any of the following to help determine if you have acute sinusitis or chronic sinusitis:  Look in your nose for signs of abnormal growths in your nostrils (nasal polyps).  Tap over the affected sinus to check for signs of infection.  View the inside of your sinuses using an imaging device that has a light attached (endoscope). If your health care provider suspects that you have chronic sinusitis, one or  more of the following tests may be recommended:  Allergy tests.  Nasal culture. A sample of mucus is taken from your nose, sent to a lab, and screened for bacteria.  Nasal cytology. A sample of mucus is taken from your nose and examined by your health care provider to determine if your sinusitis is related to an allergy. TREATMENT Most cases of acute sinusitis are related to a viral infection and will resolve on their own within 10 days. Sometimes, medicines are prescribed to help relieve symptoms of both acute and chronic sinusitis. These may include pain medicines, decongestants, nasal steroid sprays, or saline sprays. However, for sinusitis related to a bacterial infection, your health care provider will prescribe antibiotic medicines. These are medicines that will help kill the bacteria causing the infection. Rarely, sinusitis is caused by a fungal infection. In these cases, your health care provider will prescribe antifungal medicine. For some cases of chronic sinusitis, surgery is needed. Generally, these are cases in which sinusitis recurs more than 3 times per year, despite other treatments. HOME CARE INSTRUCTIONS  Drink plenty of water. Water helps thin the mucus so your sinuses can drain more easily.  Use a humidifier.  Inhale steam  3-4 times a day (for example, sit in the bathroom with the shower running).  Apply a warm, moist washcloth to your face 3-4 times a day, or as directed by your health care provider.  Use saline nasal sprays to help moisten and clean your sinuses.  Take medicines only as directed by your health care provider.  If you were prescribed either an antibiotic or antifungal medicine, finish it all even if you start to feel better. SEEK IMMEDIATE MEDICAL CARE IF:  You have increasing pain or severe headaches.  You have nausea, vomiting, or drowsiness.  You have swelling around your face.  You have vision problems.  You have a stiff neck.  You have  difficulty breathing.   This information is not intended to replace advice given to you by your health care provider. Make sure you discuss any questions you have with your health care provider.   Document Released: 08/05/2005 Document Revised: 08/26/2014 Document Reviewed: 08/20/2011 Elsevier Interactive Patient Education [employer]

## 2015-08-03 NOTE — Progress Notes (Signed)
[name] Female    DOB: 12/30/1980   34 y.o.   MRN: [id number] Visit Date: 08/03/2015  Today's Provider: Margaretann LovelessJennifer M Shaunika Italiano, PA-C   Chief Complaint  Patient presents with  . Sore Throat   Subjective:    Sore Throat  This is a new problem. The current episode started in the past 7 days. The problem has been gradually worsening. Neither side of throat is experiencing more pain than the other. There has been no fever. Associated symptoms include congestion, coughing (dry, just started today) and ear pain (right ear). Pertinent negatives include no shortness of breath, trouble swallowing or vomiting. She has had exposure to strep (son, daughter, and husband End of November). Treatments tried: advil. The treatment provided no relief.  She states her husband did not complete his therapy.     Allergies  Allergen Reactions  . Bactrim Other (See Comments)    Dizziness   . Sulfa Antibiotics Other (See Comments)    dizziness   Previous Medications   IBUPROFEN (ADVIL,MOTRIN) 800 MG TABLET    Take 1 tablet (800 mg total) by mouth 3 (three) times daily with meals as needed for headache or moderate pain.    Review of Systems  Constitutional: Negative for fever and chills.  HENT: Positive for congestion, ear pain (right ear), sinus pressure and sore throat. Negative for postnasal drip, rhinorrhea, sneezing, tinnitus, trouble swallowing and voice change.   Eyes: Negative.   Respiratory: Positive for cough (dry, just started today). Negative for chest tightness, shortness of breath and wheezing.   Cardiovascular: Negative.   Gastrointestinal: Negative for nausea and vomiting.  All other systems reviewed and are negative.   Social History  Substance Use Topics  . Smoking status: Former Smoker -- 0.50 packs/day for 6 years    Types: Cigarettes    Quit date: 08/19/2004  . Smokeless tobacco: Never Used     Comment: quit in 2004  . Alcohol Use: 0.0 oz/week    0  Standard drinks or equivalent per week     Comment: OCCASIONALLY   Objective:   BP 116/60 mmHg  Pulse 97  Temp(Src) 98.8 F (37.1 C) (Oral)  Resp 16  Wt 161 lb 3.2 oz (73.12 kg)  SpO2 97%  LMP 07/13/2015  Physical Exam  Constitutional: She appears well-developed and well-nourished. No distress.  HENT:  Head: Normocephalic and atraumatic.  Right Ear: Hearing, tympanic membrane, external ear and ear canal normal.  Left Ear: Hearing, tympanic membrane, external ear and ear canal normal.  Nose: Right sinus exhibits maxillary sinus tenderness (worse than left). Right sinus exhibits no frontal sinus tenderness. Left sinus exhibits maxillary sinus tenderness. Left sinus exhibits no frontal sinus tenderness.  Mouth/Throat: Uvula is midline and mucous membranes are normal. Oropharyngeal exudate, posterior oropharyngeal edema and posterior oropharyngeal erythema present. No tonsillar abscesses.  Neck: Normal range of motion. Neck supple. No tracheal deviation present. No thyromegaly present.  Cardiovascular: Normal rate, regular rhythm and normal heart sounds.  Exam reveals no gallop and no friction rub.   No murmur heard. Pulmonary/Chest: Effort normal and breath sounds normal. No stridor. No respiratory distress. She has no wheezes. She has no rales.  Lymphadenopathy:    She has cervical adenopathy (on right).  Skin: She is not diaphoretic.  Vitals reviewed.       Assessment & Plan:     1. Strep sore throat I did not perform a rapid strep today in the office  due to her history of having positive strep throat in her household including her husband and 2 children. She does exhibit signs and symptoms of strep throat on physical exam. I will treat her with amoxicillin as below. She is to call the office if symptoms fail to improve or worsen in 7-10 days. - amoxicillin (AMOXIL) 875 MG tablet; Take 1 tablet (875 mg total) by mouth 2 (two) times daily.  Dispense: 20 tablet; Refill: 0  2.  Acute maxillary sinusitis, recurrence not specified She also has right-sided maxillary tenderness. I did advise her that she may also take a decongestant such as Sudafed or Mucinex DM to help with congestion. I also advised her to try to get as much rest as possible and to make sure to be staying well-hydrated. She states she has been staying well-hydrated since having a kidney stone in August. She is to call the office if symptoms fail to improve or worsen in the next 7-10 days.  3. Cough Worsening. I do feel that this is most likely secondary to drainage and inflammation in the postpharyngeal tissues. I did prescribe her Hycodan cough syrup as below for nighttime cough. She is to call the office if symptoms still to improve or worsen. - HYDROcodone-homatropine (HYCODAN) 5-1.5 MG/5ML syrup; Take 5 mLs by mouth every 8 (eight) hours as needed for cough.  Dispense: 120 mL; Refill: 0       Margaretann Loveless, PA-C  [facility]

## 2015-08-07 ENCOUNTER — Telehealth: Payer: Self-pay | Admitting: Family Medicine

## 2015-08-07 NOTE — Telephone Encounter (Signed)
Kayla at [facility] [address] called saying she needs to verify a RX for hycodan that pt dropped off this weekend. .  Call back for pharmacy is  [phone]  [facility]

## 2015-08-07 NOTE — Telephone Encounter (Signed)
[facility]  [address]. They wanted [facility]  Thanks,  -Joseline

## 2015-11-20 ENCOUNTER — Ambulatory Visit (INDEPENDENT_AMBULATORY_CARE_PROVIDER_SITE_OTHER): Payer: 59 | Admitting: Physician Assistant

## 2015-11-20 ENCOUNTER — Encounter: Payer: Self-pay | Admitting: Physician Assistant

## 2015-11-20 VITALS — BP 116/76 | HR 62 | Temp 98.8°F | Resp 14 | Wt 150.2 lb

## 2015-11-20 DIAGNOSIS — J302 Other seasonal allergic rhinitis: Secondary | ICD-10-CM

## 2015-11-20 DIAGNOSIS — R3915 Urgency of urination: Secondary | ICD-10-CM

## 2015-11-20 LAB — POCT URINALYSIS DIPSTICK
Bilirubin, UA: NEGATIVE
Glucose, UA: NEGATIVE
KETONES UA: NEGATIVE
Leukocytes, UA: NEGATIVE
Nitrite, UA: NEGATIVE
PH UA: 7
PROTEIN UA: NEGATIVE
Urobilinogen, UA: 0.2

## 2015-11-20 NOTE — Patient Instructions (Signed)
Hay Fever Hay fever is an allergic reaction to particles in the air. It cannot be passed from person to person. It cannot be cured, but it can be controlled. CAUSES  Hay fever is caused by something that triggers an allergic reaction (allergens). The following are examples of allergens:  Ragweed.  Feathers.  Animal dander.  Grass and tree pollens.  Cigarette smoke.  House dust.  Pollution. SYMPTOMS   Sneezing.  Runny or stuffy nose.  Tearing eyes.  Itchy eyes, nose, mouth, throat, skin, or other area.  Sore throat.  Headache.  Decreased sense of smell or taste. DIAGNOSIS Your caregiver will perform a physical exam and ask questions about the symptoms you are having.Allergy testing may be done to determine exactly what triggers your hay fever.  TREATMENT   Over-the-counter medicines may help symptoms. These include:  Antihistamines.  Decongestants. These may help with nasal congestion.  Your caregiver may prescribe medicines if over-the-counter medicines do not work.  Some people benefit from allergy shots when other medicines are not helpful. HOME CARE INSTRUCTIONS   Avoid the allergen that is causing your symptoms, if possible.  Take all medicine as told by your caregiver. SEEK MEDICAL CARE IF:   You have severe allergy symptoms and your current medicines are not helping.  Your treatment was working at one time, but you are now experiencing symptoms.  You have sinus congestion and pressure.  You develop a fever or headache.  You have thick nasal discharge.  You have asthma and have a worsening cough and wheezing. SEEK IMMEDIATE MEDICAL CARE IF:   You have swelling of your tongue or lips.  You have trouble breathing.  You feel lightheaded or like you are going to faint.  You have cold sweats.  You have a fever.   This information is not intended to replace advice given to you by your health care provider. Make sure you discuss any  questions you have with your health care provider.   Document Released: 08/05/2005 Document Revised: 10/28/2011 Document Reviewed: 02/15/2015 Elsevier Interactive Patient Education 2016 Elsevier Inc.  

## 2015-11-20 NOTE — Addendum Note (Signed)
Addended by: Margaretann LovelessBURNETTE, Ashleigh Luckow M on: 11/20/2015 12:12 PM   Modules accepted: Kipp BroodSmartSet

## 2015-11-20 NOTE — Progress Notes (Signed)
Patient ID: Victoria [name], female   DOB: 07/06/1981, 35 y.o.   MRN: [id number]   [name] Female    DOB: 04/03/1981   35 y.o.   MRN: [id number] Visit Date: 11/20/2015  Today's Provider: Margaretann LovelessJennifer M Colton Engdahl, PA-C   Chief Complaint  Patient presents with  . Cough  . Urinary Urgency   Subjective:    Cough This is a new problem. The current episode started 1 to 4 weeks ago. The problem has been unchanged. The cough is non-productive. She has tried OTC cough suppressant for the symptoms. The treatment provided no relief.  Urinary Tract Infection  This is a new problem. The current episode started in the past 7 days. The problem occurs every urination. The problem has been unchanged. The patient is experiencing no pain. Associated symptoms include urgency. She has tried nothing for the symptoms.      Previous Medications   IBUPROFEN (ADVIL,MOTRIN) 800 MG TABLET    Take 1 tablet (800 mg total) by mouth 3 (three) times daily with meals as needed for headache or moderate pain.   Allergies  Allergen Reactions  . Bactrim Other (See Comments)    Dizziness   . Sulfa Antibiotics Other (See Comments)    dizziness    Review of Systems  Constitutional: Negative.   HENT: Negative.   Eyes: Negative.   Respiratory: Positive for cough.   Cardiovascular: Negative.   Gastrointestinal: Negative.   Endocrine: Negative.   Genitourinary: Positive for urgency.  Musculoskeletal: Negative.   Skin: Negative.   Allergic/Immunologic: Negative.   Neurological: Negative.   Hematological: Negative.   Psychiatric/Behavioral: Negative.     Social History  Substance Use Topics  . Smoking status: Former Smoker -- 0.50 packs/day for 6 years    Types: Cigarettes    Quit date: 08/19/2004  . Smokeless tobacco: Never Used     Comment: quit in 2004  . Alcohol Use: 0.0 oz/week    0 Standard drinks or equivalent per week     Comment: OCCASIONALLY   Objective:   BP 116/76 mmHg  Pulse 62   Temp(Src) 98.8 F (37.1 C) (Oral)  Resp 14  Wt 150 lb 3.2 oz (68.13 kg)  Physical Exam  Constitutional: She is oriented to person, place, and time. She appears well-developed and well-nourished. No distress.  HENT:  Head: Normocephalic and atraumatic.  Right Ear: Hearing, tympanic membrane, external ear and ear canal normal.  Left Ear: Hearing, tympanic membrane, external ear and ear canal normal.  Nose: Nose normal. No mucosal edema (pale). Right sinus exhibits no maxillary sinus tenderness and no frontal sinus tenderness. Left sinus exhibits no maxillary sinus tenderness and no frontal sinus tenderness.  Mouth/Throat: Uvula is midline, oropharynx is clear and moist and mucous membranes are normal. No oropharyngeal exudate, posterior oropharyngeal edema or posterior oropharyngeal erythema.  Eyes: Conjunctivae are normal. Pupils are equal, round, and reactive to light. Right eye exhibits no discharge. Left eye exhibits no discharge. No scleral icterus.  Neck: Normal range of motion. Neck supple. No tracheal deviation present. No thyromegaly present.  Cardiovascular: Normal rate, regular rhythm and normal heart sounds.  Exam reveals no gallop and no friction rub.   No murmur heard. Pulmonary/Chest: Effort normal and breath sounds normal. No stridor. No respiratory distress. She has no wheezes. She has no rales.  Abdominal: Soft. Normal appearance and bowel sounds are normal. She exhibits no distension and no mass. There is no hepatosplenomegaly. There is tenderness in the suprapubic  area, left upper quadrant and left lower quadrant. There is no rebound, no guarding and no CVA tenderness.  Suprapubic pressure  Lymphadenopathy:    She has no cervical adenopathy.  Neurological: She is alert and oriented to person, place, and time.  Skin: Skin is warm and dry. She is not diaphoretic.  Vitals reviewed.       Assessment & Plan:     1. Seasonal allergies I do feel her cough is secondary to  seasonal allergies. I did advise her to start claritin or loratadine and flonase. She is to call if symptoms worsen or do not improve with treatment.   2. Urinary urgency UA was negative for UTI. She does have h/o kidney stone. Will send for urine culture as below and f/u pending results. She is to call if symptoms of kidney stone begin.  - POCT urinalysis dipstick - Urine Culture   Follow up: No Follow-up on file.

## 2015-11-21 LAB — URINE CULTURE

## 2015-11-22 ENCOUNTER — Telehealth: Payer: Self-pay

## 2015-11-22 NOTE — Telephone Encounter (Signed)
-----   Message from Margaretann LovelessJennifer M Burnette, PA-C sent at 11/22/2015  8:46 AM EDT ----- Normal urine culture

## 2015-11-22 NOTE — Telephone Encounter (Signed)
Patient advised as directed below. Patient verbalized understanding.  

## 2015-12-20 ENCOUNTER — Ambulatory Visit (INDEPENDENT_AMBULATORY_CARE_PROVIDER_SITE_OTHER): Payer: 59 | Admitting: Advanced Practice Midwife

## 2015-12-20 ENCOUNTER — Encounter: Payer: Self-pay | Admitting: Advanced Practice Midwife

## 2015-12-20 ENCOUNTER — Encounter (INDEPENDENT_AMBULATORY_CARE_PROVIDER_SITE_OTHER): Payer: Self-pay

## 2015-12-20 VITALS — BP 101/57 | HR 85 | Resp 18 | Ht 66.0 in | Wt 146.0 lb

## 2015-12-20 DIAGNOSIS — Z87442 Personal history of urinary calculi: Secondary | ICD-10-CM | POA: Insufficient documentation

## 2015-12-20 DIAGNOSIS — Z01419 Encounter for gynecological examination (general) (routine) without abnormal findings: Secondary | ICD-10-CM | POA: Diagnosis not present

## 2015-12-20 DIAGNOSIS — Z113 Encounter for screening for infections with a predominantly sexual mode of transmission: Secondary | ICD-10-CM

## 2015-12-20 DIAGNOSIS — R3915 Urgency of urination: Secondary | ICD-10-CM | POA: Diagnosis not present

## 2015-12-20 LAB — POCT URINALYSIS DIPSTICK
Bilirubin, UA: NEGATIVE
Glucose, UA: NEGATIVE
KETONES UA: NEGATIVE
Leukocytes, UA: NEGATIVE
Nitrite, UA: NEGATIVE
PH UA: 7.5
PROTEIN UA: NEGATIVE
SPEC GRAV UA: 1.01
Urobilinogen, UA: 0.2

## 2015-12-20 NOTE — Progress Notes (Signed)
Pt c/o urinary urgency, has a hx of kidney stones,  UA + blood, will send urine cx.

## 2015-12-20 NOTE — Progress Notes (Signed)
Subjective:     Victoria [name] is a 35 y.o. female here for a routine exam.  Current complaints: irregular spotting, abdominal cramping and left lower back pain similar to previous kidney stone pain.  Pain started 2-3 weeks ago and is mild but persistent.  Desires STD testing.      Gynecologic History Patient's last menstrual period was 11/27/2015. Contraception: tubal ligation Last Pap: 2016. Results were: normal Last mammogram: n/a.   Obstetric History OB History  Gravida Para Term Preterm AB SAB TAB Ectopic Multiple Living  2 2 2       2     # Outcome Date GA Lbr Len/2nd Weight Sex Delivery Anes PTL Lv  2 Term 02/27/10 [id number]  7 lb (3.175 kg) F Vag-Spont EPI  Y  1 Term 04/13/04    M Vag-Spont   Y       The following portions of the patient's history were reviewed and updated as appropriate: allergies, current medications, past family history, past medical history, past social history, past surgical history and problem list.  Review of Systems Pertinent items noted in HPI and remainder of comprehensive ROS otherwise negative.    Objective:     BP 101/57 mmHg  Pulse 85  Resp 18  Ht 5\' 6"  (1.676 m)  Wt 146 lb (66.225 kg)  BMI 23.58 kg/m2  LMP 11/27/2015 GENERAL: Well-developed, well-nourished female in no acute distress.  HEENT: Normocephalic, atraumatic. Sclerae anicteric.  NECK: Supple. Normal thyroid.  LUNGS: Clear to auscultation bilaterally.  HEART: Regular rate and rhythm. BREASTS: Symmetric in size. No masses, skin changes, nipple drainage, or lymphadenopathy. ABDOMEN: Soft, nontender, nondistended. No organomegaly. PELVIC: Normal external female genitalia. Vagina is pink and rugated.  Normal discharge. Normal cervix contour. Pap smear obtained. Uterus is normal in size. No adnexal mass or tenderness.  EXTREMITIES: No cyanosis, clubbing, or edema, 2+ distal pulses.      Assessment:    Healthy female exam.    Plan:    Education reviewed: low fat, low  cholesterol diet, self breast exams and weight bearing exercise. F/U with primary care if kidney symptoms persist  Discussed renal US with pt, pt may call office to request outpatient US to evaluate kidney.

## 2015-12-21 LAB — GC/CHLAMYDIA PROBE AMP

## 2015-12-21 LAB — HEPATITIS B SURFACE ANTIGEN: Hepatitis B Surface Ag: NEGATIVE

## 2015-12-21 LAB — HEPATITIS C ANTIBODY: Hep C Virus Ab: <0.1 s/co ratio (ref 0.0–0.9)

## 2015-12-21 LAB — HIV ANTIBODY (ROUTINE TESTING W REFLEX): HIV Screen 4th Generation wRfx: NONREACTIVE

## 2015-12-21 LAB — RPR: RPR Ser Ql: NONREACTIVE

## 2015-12-22 LAB — URINE CULTURE

## 2015-12-24 LAB — PAP LB, RFX HPV ASCU: PAP Smear Comment: 0

## 2016-01-22 ENCOUNTER — Ambulatory Visit: Payer: 59 | Admitting: Physician Assistant

## 2016-04-02 ENCOUNTER — Telehealth: Payer: Self-pay | Admitting: *Deleted

## 2016-04-02 NOTE — Telephone Encounter (Signed)
Pt called stating she is experiencing a change in her menstrual cycles, states that her cycle usually comes every 4 weeks and now it comes about every 3 weeks.  No change in the flow and still continues to have one every month.  Informed pt that stressors as well as hormone fluctuations as we age can cause the menstrual cycle to change and to call back if she starts to experience no cycle or multiple cycles through out the month.  Pt acknowledged instructions.

## 2016-04-02 NOTE — Telephone Encounter (Signed)
-----   Message from Olevia BowensJacinda S Battle sent at 04/02/2016 10:38 AM EDT ----- Regarding: Advise Contact: [phone] Has questions about her cycle, being irregular, wants to know what's normal and what's not

## 2016-06-21 ENCOUNTER — Encounter: Payer: Self-pay | Admitting: Physician Assistant

## 2016-06-21 ENCOUNTER — Ambulatory Visit (INDEPENDENT_AMBULATORY_CARE_PROVIDER_SITE_OTHER): Payer: 59 | Admitting: Physician Assistant

## 2016-06-21 VITALS — BP 110/70 | HR 88 | Temp 98.2°F | Resp 16 | Wt 144.0 lb

## 2016-06-21 DIAGNOSIS — J0140 Acute pansinusitis, unspecified: Secondary | ICD-10-CM

## 2016-06-21 DIAGNOSIS — F419 Anxiety disorder, unspecified: Secondary | ICD-10-CM | POA: Diagnosis not present

## 2016-06-21 MED ORDER — AMOXICILLIN-POT CLAVULANATE 875-125 MG PO TABS: 1.0000 | ORAL_TABLET | Freq: Two times a day (BID) | ORAL | 0 refills | Status: DC

## 2016-06-21 NOTE — Progress Notes (Signed)
[name] Female    DOB: 11/24/1980   35 y.o.   MRN: [id number] Visit Date: 06/21/2016  Today's Provider: Margaretann LovelessJennifer M Effa Yarrow, PA-C   Chief Complaint  Patient presents with  . Sinusitis   Subjective:    HPI Sinusitis: Patient c/o sinusitis.  Her symptoms include nasal congestion, cough, headaches.  There has not been a history of frequent clearing of the throat, facial pain, spitting/vomiting mucous. Other medications have included oral decongestants. Patient reports she stopped using Mucinex due to feeling of high blood pressure. Patient reports that her cough and post nasal drip are worsening. Feels clammy but has not checked temperature.     Allergies  Allergen Reactions  . Bactrim Other (See Comments)    Dizziness   . Sulfa Antibiotics Other (See Comments)    dizziness     Current Outpatient Prescriptions:  .  ibuprofen (ADVIL,MOTRIN) 800 MG tablet, Take 1 tablet (800 mg total) by mouth 3 (three) times daily with meals as needed for headache or moderate pain., Disp: 30 tablet, Rfl: 3  Review of Systems  Constitutional: Negative.   HENT: Positive for congestion, ear pain, postnasal drip, rhinorrhea, sinus pressure and sore throat. Negative for trouble swallowing.   Respiratory: Positive for cough. Negative for chest tightness and wheezing.   Cardiovascular: Negative.   Gastrointestinal: Negative for abdominal pain.  Allergic/Immunologic: Positive for environmental allergies.  Neurological: Positive for headaches. Negative for dizziness.    Social History  Substance Use Topics  . Smoking status: Former Smoker    Packs/day: 0.50    Years: 6.00    Types: Cigarettes    Quit date: 08/19/2004  . Smokeless tobacco: Never Used     Comment: quit in 2004  . Alcohol use 0.0 oz/week     Comment: OCCASIONALLY   Objective:   BP 110/70 (BP Location: Right Arm, Patient Position: Sitting, Cuff Size: Normal)   Pulse 88   Temp 98.2 F (36.8 C) (Oral)   Resp  16   Wt 144 lb (65.3 kg)   SpO2 95%   BMI 23.24 kg/m   Physical Exam  Constitutional: She appears well-developed and well-nourished. No distress.  HENT:  Head: Normocephalic and atraumatic.  Right Ear: Hearing, tympanic membrane, external ear and ear canal normal.  Left Ear: Hearing, tympanic membrane, external ear and ear canal normal.  Nose: Mucosal edema and rhinorrhea present. Right sinus exhibits maxillary sinus tenderness and frontal sinus tenderness. Left sinus exhibits maxillary sinus tenderness and frontal sinus tenderness.  Mouth/Throat: Uvula is midline, oropharynx is clear and moist and mucous membranes are normal. No oropharyngeal exudate, posterior oropharyngeal edema or posterior oropharyngeal erythema.  Neck: Normal range of motion. Neck supple. No tracheal deviation present. No thyromegaly present.  Cardiovascular: Normal rate, regular rhythm and normal heart sounds.  Exam reveals no gallop and no friction rub.   No murmur heard. Pulmonary/Chest: Effort normal and breath sounds normal. No stridor. No respiratory distress. She has no wheezes. She has no rales.  Lymphadenopathy:    She has no cervical adenopathy.  Skin: She is not diaphoretic.  Vitals reviewed.      Assessment & Plan:     1. Acute pansinusitis, recurrence not specified Worsening symptoms that have not responded to OTC medications. Will give augmentin as below. May use delsym for cough. Stay well hydrated and get plenty of rest. Call if no symptom improvement or if symptoms worsen. - amoxicillin-clavulanate (AUGMENTIN) 875-125 MG tablet; Take 1  tablet by mouth 2 (two) times daily.  Dispense: 20 tablet; Refill: 0  2. Acute anxiety Will check labs as below and f/u pending results. - Thyroid Panel With TSH       Margaretann LovelessJennifer M Adryen Cookson, PA-C  [facility]

## 2016-06-21 NOTE — Patient Instructions (Signed)

## 2016-06-22 LAB — THYROID PANEL WITH TSH
FREE THYROXINE INDEX: 2.1 (ref 1.2–4.9)
T3 UPTAKE RATIO: 25 % (ref 24–39)
T4, Total: 8.2 ug/dL (ref 4.5–12.0)
TSH: 1.41 u[IU]/mL (ref 0.450–4.500)

## 2016-06-24 ENCOUNTER — Telehealth: Payer: Self-pay

## 2016-06-24 NOTE — Telephone Encounter (Signed)
-----   Message from Margaretann LovelessJennifer M Burnette, PA-C sent at 06/24/2016  8:44 AM EST ----- TSH stable at 1.410

## 2016-06-24 NOTE — Telephone Encounter (Signed)
Patient has been advised. KW 

## 2016-10-21 ENCOUNTER — Encounter: Payer: Self-pay | Admitting: Physician Assistant

## 2016-10-21 ENCOUNTER — Ambulatory Visit (INDEPENDENT_AMBULATORY_CARE_PROVIDER_SITE_OTHER): Payer: 59 | Admitting: Physician Assistant

## 2016-10-21 VITALS — BP 104/68 | HR 80 | Temp 98.5°F | Resp 16 | Wt 149.0 lb

## 2016-10-21 DIAGNOSIS — J3089 Other allergic rhinitis: Secondary | ICD-10-CM | POA: Diagnosis not present

## 2016-10-21 MED ORDER — LORATADINE 10 MG PO TBDP: 10.0000 mg | ORAL_TABLET | Freq: Every day | ORAL | 0 refills | Status: DC

## 2016-10-21 MED ORDER — DEXTROMETHORPHAN POLISTIREX ER 30 MG/5ML PO SUER: 15.0000 mg | Freq: Two times a day (BID) | ORAL | 0 refills | Status: DC | PRN

## 2016-10-21 NOTE — Progress Notes (Signed)
[facility]  Chief Complaint  Patient presents with  . URI    Started about a month ago.  . Sinusitis    Subjective:    Patient ID: Victoria [name], female    DOB: 1980/08/26, 36 y.o.   MRN: [id number]  Upper Respiratory Infection: Victoria [name] is a 36 y.o. female with a past medical history significant for 1 pack year smoking history, allergies complaining of symptoms of a URI, possible sinusitis. Symptoms include bilateral ear pain, congestion, cough and sore throat. Onset of symptoms was 1 month ago, gradually worsening since that time. She also c/o bilateral ear pressure/pain, congestion, no  fever and non productive cough for the past 1 month .  She is drinking plenty of fluids. Evaluation to date: none. Treatment to date: cough suppressants and decongestants. The treatment has provided minimal relief. She has had these same symptoms this time last year.  Review of Systems  Constitutional: Positive for fatigue. Negative for activity change, appetite change, chills, diaphoresis, fever and unexpected weight change.  HENT: Positive for congestion, ear pain, sinus pain, sinus pressure and sore throat. Negative for ear discharge, hearing loss, nosebleeds, postnasal drip, rhinorrhea, sneezing, tinnitus, trouble swallowing and voice change.   Eyes: Positive for itching. Negative for photophobia, pain, discharge, redness and visual disturbance.  Respiratory: Positive for cough, chest tightness and shortness of breath. Negative for apnea, choking, wheezing and stridor.   Gastrointestinal: Negative.   Musculoskeletal: Positive for neck pain and neck stiffness.  Neurological: Positive for headaches. Negative for dizziness and light-headedness.       Objective:   BP 104/68 (BP Location: Left Arm, Patient Position: Sitting, Cuff Size: Normal)   Pulse 80   Temp 98.5 F (36.9 C) (Oral)   Resp 16   Wt 149 lb (67.6 kg)   LMP 10/17/2016   BMI 24.05  kg/m   Patient Active Problem List   Diagnosis Date Noted  . Hx of renal calculi 12/20/2015  . Hypercholesterolemia without hypertriglyceridemia 02/23/2015  . Caries 02/23/2015  . Avitaminosis D 02/23/2015    Outpatient Encounter Prescriptions as of 10/21/2016  Medication Sig  . dextromethorphan (DELSYM) 30 MG/5ML liquid Take 2.5 mLs (15 mg total) by mouth 2 (two) times daily as needed for cough.  Marland Kitchen ibuprofen (ADVIL,MOTRIN) 800 MG tablet Take 1 tablet (800 mg total) by mouth 3 (three) times daily with meals as needed for headache or moderate pain.  Marland Kitchen loratadine (CLARITIN REDITABS) 10 MG dissolvable tablet Take 1 tablet (10 mg total) by mouth daily.  . [DISCONTINUED] amoxicillin-clavulanate (AUGMENTIN) 875-125 MG tablet Take 1 tablet by mouth 2 (two) times daily.   No facility-administered encounter medications on file as of 10/21/2016.     Allergies  Allergen Reactions  . Bactrim Other (See Comments)    Dizziness   . Sulfa Antibiotics Other (See Comments)    dizziness       Physical Exam  Constitutional: She is oriented to person, place, and time. She appears well-developed and well-nourished.  HENT:  Head: Normocephalic and atraumatic.  Mouth/Throat: Mucous membranes are normal. Posterior oropharyngeal erythema present.  Eyes: Conjunctivae are normal.  Neck: Neck supple.  Cardiovascular: Normal rate and regular rhythm.  Exam reveals no gallop and no friction rub.   No murmur heard. Pulmonary/Chest: Effort normal and breath sounds normal. No respiratory distress. She has no wheezes. She has no rales.  Lymphadenopathy:    She has no cervical adenopathy.  Neurological: She is alert and  oriented to person, place, and time.  Skin: Skin is warm and dry.  Psychiatric: She has a normal mood and affect. [name] behavior is normal.       Assessment & Plan:  1. Seasonal allergic rhinitis due to other allergic trigger, unspecified chronicity  - loratadine (CLARITIN REDITABS) 10 MG  dissolvable tablet; Take 1 tablet (10 mg total) by mouth daily.  Dispense: 90 tablet; Refill: 0 - dextromethorphan (DELSYM) 30 MG/5ML liquid; Take 2.5 mLs (15 mg total) by mouth 2 (two) times daily as needed for cough.  Dispense: 89 mL; Refill: 0   Recommend rest, fluids, frequent hand washing.   Patient Instructions  Allergic Rhinitis Allergic rhinitis is when the mucous membranes in the nose respond to allergens. Allergens are particles in the air that cause your body to have an allergic reaction. This causes you to release allergic antibodies. Through a chain of events, these eventually cause you to release histamine into the blood stream. Although meant to protect the body, it is this release of histamine that causes your discomfort, such as frequent sneezing, congestion, and an itchy, runny nose. What are the causes? Seasonal allergic rhinitis (hay fever) is caused by pollen allergens that may come from grasses, trees, and weeds. Year-round allergic rhinitis (perennial allergic rhinitis) is caused by allergens such as house dust mites, pet dander, and mold spores. What are the signs or symptoms?  Nasal stuffiness (congestion).  Itchy, runny nose with sneezing and tearing of the eyes. How is this diagnosed? Your health care provider can help you determine the allergen or allergens that trigger your symptoms. If you and your health care provider are unable to determine the allergen, skin or blood testing may be used. Your health care provider will diagnose your condition after taking your health history and performing a physical exam. Your health care provider may assess you for other related conditions, such as asthma, pink eye, or an ear infection. How is this treated? Allergic rhinitis does not have a cure, but it can be controlled by:  Medicines that block allergy symptoms. These may include allergy shots, nasal sprays, and oral antihistamines.  Avoiding the allergen. Hay fever may  often be treated with antihistamines in pill or nasal spray forms. Antihistamines block the effects of histamine. There are over-the-counter medicines that may help with nasal congestion and swelling around the eyes. Check with your health care provider before taking or giving this medicine. If avoiding the allergen or the medicine prescribed do not work, there are many new medicines your health care provider can prescribe. Stronger medicine may be used if initial measures are ineffective. Desensitizing injections can be used if medicine and avoidance does not work. Desensitization is when a patient is given ongoing shots until the body becomes less sensitive to the allergen. Make sure you follow up with your health care provider if problems continue. Follow these instructions at home: It is not possible to completely avoid allergens, but you can reduce your symptoms by taking steps to limit your exposure to them. It helps to know exactly what you are allergic to so that you can avoid your specific triggers. Contact a health care provider if:  You have a fever.  You develop a cough that does not stop easily (persistent).  You have shortness of breath.  You start wheezing.  Symptoms interfere with normal daily activities. This information is not intended to replace advice given to you by your health care provider. Make sure you discuss any questions  you have with your health care provider. Document Released: 04/30/2001 Document Revised: 04/05/2016 Document Reviewed: 04/12/2013 Elsevier Interactive Patient Education  2017 [employer].     The entirety of the information documented in the History of Present Illness, Review of Systems and Physical Exam were personally obtained by me. Portions of this information were initially documented by Kavin Leech, CMA and reviewed by me for thoroughness and accuracy.

## 2016-10-21 NOTE — Patient Instructions (Signed)
Allergic Rhinitis Allergic rhinitis is when the mucous membranes in the nose respond to allergens. Allergens are particles in the air that cause your body to have an allergic reaction. This causes you to release allergic antibodies. Through a chain of events, these eventually cause you to release histamine into the blood stream. Although meant to protect the body, it is this release of histamine that causes your discomfort, such as frequent sneezing, congestion, and an itchy, runny nose. What are the causes? Seasonal allergic rhinitis (hay fever) is caused by pollen allergens that may come from grasses, trees, and weeds. Year-round allergic rhinitis (perennial allergic rhinitis) is caused by allergens such as house dust mites, pet dander, and mold spores. What are the signs or symptoms?  Nasal stuffiness (congestion).  Itchy, runny nose with sneezing and tearing of the eyes. How is this diagnosed? Your health care provider can help you determine the allergen or allergens that trigger your symptoms. If you and your health care provider are unable to determine the allergen, skin or blood testing may be used. Your health care provider will diagnose your condition after taking your health history and performing a physical exam. Your health care provider may assess you for other related conditions, such as asthma, pink eye, or an ear infection. How is this treated? Allergic rhinitis does not have a cure, but it can be controlled by:  Medicines that block allergy symptoms. These may include allergy shots, nasal sprays, and oral antihistamines.  Avoiding the allergen. Hay fever may often be treated with antihistamines in pill or nasal spray forms. Antihistamines block the effects of histamine. There are over-the-counter medicines that may help with nasal congestion and swelling around the eyes. Check with your health care provider before taking or giving this medicine. If avoiding the allergen or the  medicine prescribed do not work, there are many new medicines your health care provider can prescribe. Stronger medicine may be used if initial measures are ineffective. Desensitizing injections can be used if medicine and avoidance does not work. Desensitization is when a patient is given ongoing shots until the body becomes less sensitive to the allergen. Make sure you follow up with your health care provider if problems continue. Follow these instructions at home: It is not possible to completely avoid allergens, but you can reduce your symptoms by taking steps to limit your exposure to them. It helps to know exactly what you are allergic to so that you can avoid your specific triggers. Contact a health care provider if:  You have a fever.  You develop a cough that does not stop easily (persistent).  You have shortness of breath.  You start wheezing.  Symptoms interfere with normal daily activities. This information is not intended to replace advice given to you by your health care provider. Make sure you discuss any questions you have with your health care provider. Document Released: 04/30/2001 Document Revised: 04/05/2016 Document Reviewed: 04/12/2013 Elsevier Interactive Patient Education  2017 [employer] Inc.  

## 2016-10-30 ENCOUNTER — Telehealth: Payer: Self-pay | Admitting: Radiology

## 2016-10-30 NOTE — Telephone Encounter (Signed)
Called patient, left voicemail on cell phone to call [facility] to schedule Annual Exam with Dr Shawnie PonsPratt (approx. 12/19/16)

## 2016-11-05 ENCOUNTER — Encounter: Payer: Self-pay | Admitting: Family Medicine

## 2016-12-24 ENCOUNTER — Ambulatory Visit (INDEPENDENT_AMBULATORY_CARE_PROVIDER_SITE_OTHER): Payer: 59 | Admitting: Family Medicine

## 2016-12-24 ENCOUNTER — Encounter: Payer: Self-pay | Admitting: Family Medicine

## 2016-12-24 VITALS — BP 105/69 | HR 93 | Ht 66.5 in | Wt 151.0 lb

## 2016-12-24 DIAGNOSIS — Z01419 Encounter for gynecological examination (general) (routine) without abnormal findings: Secondary | ICD-10-CM | POA: Diagnosis not present

## 2016-12-24 DIAGNOSIS — Z1151 Encounter for screening for human papillomavirus (HPV): Secondary | ICD-10-CM

## 2016-12-24 DIAGNOSIS — N921 Excessive and frequent menstruation with irregular cycle: Secondary | ICD-10-CM

## 2016-12-24 DIAGNOSIS — Z124 Encounter for screening for malignant neoplasm of cervix: Secondary | ICD-10-CM

## 2016-12-24 NOTE — Patient Instructions (Signed)
Preventive Care 18-39 Years, Female Preventive care refers to lifestyle choices and visits with your health care provider that can promote health and wellness. What does preventive care include?  A yearly physical exam. This is also called an annual well check.  Dental exams once or twice a year.  Routine eye exams. Ask your health care provider how often you should have your eyes checked.  Personal lifestyle choices, including:  Daily care of your teeth and gums.  Regular physical activity.  Eating a healthy diet.  Avoiding tobacco and drug use.  Limiting alcohol use.  Practicing safe sex.  Taking vitamin and mineral supplements as recommended by your health care provider. What happens during an annual well check? The services and screenings done by your health care provider during your annual well check will depend on your age, overall health, lifestyle risk factors, and family history of disease. Counseling  Your health care provider may ask you questions about your:  Alcohol use.  Tobacco use.  Drug use.  Emotional well-being.  Home and relationship well-being.  Sexual activity.  Eating habits.  Work and work environment.  Method of birth control.  Menstrual cycle.  Pregnancy history. Screening  You may have the following tests or measurements:  Height, weight, and BMI.  Diabetes screening. This is done by checking your blood sugar (glucose) after you have not eaten for a while (fasting).  Blood pressure.  Lipid and cholesterol levels. These may be checked every 5 years starting at age 20.  Skin check.  Hepatitis C blood test.  Hepatitis B blood test.  Sexually transmitted disease (STD) testing.  BRCA-related cancer screening. This may be done if you have a family history of breast, ovarian, tubal, or peritoneal cancers.  Pelvic exam and Pap test. This may be done every 3 years starting at age 21. Starting at age 30, this may be done every 5  years if you have a Pap test in combination with an HPV test. Discuss your test results, treatment options, and if necessary, the need for more tests with your health care provider. Vaccines  Your health care provider may recommend certain vaccines, such as:  Influenza vaccine. This is recommended every year.  Tetanus, diphtheria, and acellular pertussis (Tdap, Td) vaccine. You may need a Td booster every 10 years.  Varicella vaccine. You may need this if you have not been vaccinated.  HPV vaccine. If you are 26 or younger, you may need three doses over 6 months.  Measles, mumps, and rubella (MMR) vaccine. You may need at least one dose of MMR. You may also need a second dose.  Pneumococcal 13-valent conjugate (PCV13) vaccine. You may need this if you have certain conditions and were not previously vaccinated.  Pneumococcal polysaccharide (PPSV23) vaccine. You may need one or two doses if you smoke cigarettes or if you have certain conditions.  Meningococcal vaccine. One dose is recommended if you are age 19-21 years and a first-year college student living in a residence hall, or if you have one of several medical conditions. You may also need additional booster doses.  Hepatitis A vaccine. You may need this if you have certain conditions or if you travel or work in places where you may be exposed to hepatitis A.  Hepatitis B vaccine. You may need this if you have certain conditions or if you travel or work in places where you may be exposed to hepatitis B.  Haemophilus influenzae type b (Hib) vaccine. You may need this   if you have certain risk factors. Talk to your health care provider about which screenings and vaccines you need and how often you need them. This information is not intended to replace advice given to you by your health care provider. Make sure you discuss any questions you have with your health care provider. Document Released: 10/01/2001 Document Revised: 04/24/2016  Document Reviewed: 06/06/2015 Elsevier Interactive Patient Education  2017 [employer].

## 2016-12-24 NOTE — Progress Notes (Signed)
Pt states she is having a cycle about every 2.5 weeks and sees more clots than before. She states this started approx 5 months ago.

## 2016-12-24 NOTE — Progress Notes (Signed)
  Subjective:     Victoria [name] is a 36 y.o. female and is here for a comprehensive physical exam. The patient reports problems - with more frequent cycles. Cycles coming q 3.5 wks, lasts 6 days which is usual. Notes some blood clots.  Social History   Social History  . Marital status: Married    Spouse name: N/A  . Number of children: N/A  . Years of education: N/A   Occupational History  . Not on file.   Social History Main Topics  . Smoking status: Former Smoker    Packs/day: 0.50    Years: 6.00    Types: Cigarettes    Quit date: 08/19/2004  . Smokeless tobacco: Never Used     Comment: quit in 2004  . Alcohol use 0.0 oz/week     Comment: OCCASIONALLY  . Drug use: No  . Sexual activity: Yes    Partners: Male    Birth control/ protection: Surgical     Comment: tubaligation.   Other Topics Concern  . Not on file   Social History Narrative  . No narrative on file   Health Maintenance  Topic Date Due  . INFLUENZA VACCINE  03/19/2017  . PAP SMEAR  12/20/2018  . TETANUS/TDAP  06/05/2020  . HIV Screening  Completed    The following portions of the patient's history were reviewed and updated as appropriate: allergies, current medications, past family history, past medical history, past social history, past surgical history and problem list.  Review of Systems Pertinent items noted in HPI and remainder of comprehensive ROS otherwise negative.   Objective:    BP 105/69   Pulse 93   Ht 5' 6.5" (1.689 m)   Wt 151 lb (68.5 kg)   LMP 12/11/2016   BMI 24.01 kg/m  General appearance: alert, cooperative and appears stated age Head: Normocephalic, without obvious abnormality, atraumatic Neck: no adenopathy, supple, symmetrical, trachea midline and thyroid not enlarged, symmetric, no tenderness/mass/nodules Lungs: clear to auscultation bilaterally Breasts: normal appearance, no masses or tenderness Heart: regular rate and rhythm, S1, S2 normal, no murmur, click, rub or  gallop Abdomen: soft, non-tender; bowel sounds normal; no masses,  no organomegaly Pelvic: cervix normal in appearance, external genitalia normal, no adnexal masses or tenderness, no cervical motion tenderness, uterus normal size, shape, and consistency and vagina normal without discharge Extremities: extremities normal, atraumatic, no cyanosis or edema Pulses: 2+ and symmetric Skin: Skin color, texture, turgor normal. No rashes or lesions Lymph nodes: Cervical, supraclavicular, and axillary nodes normal. Neurologic: Grossly normal    Assessment:    Healthy female exam.      Plan:   Problem List Items Addressed This Visit    None    Visit Diagnoses    Menorrhagia with irregular cycle    -  Primary   Relevant Orders   Follicle stimulating hormone   TSH   Screening for malignant neoplasm of cervix       Relevant Orders   IGP, Aptima HPV, rfx 16/18,45   Encounter for gynecological examination without abnormal finding         Return in 1 year (on 12/24/2017).    See After Visit Summary for Counseling Recommendations

## 2016-12-25 LAB — TSH: TSH: 2.18 u[IU]/mL (ref 0.450–4.500)

## 2016-12-25 LAB — FOLLICLE STIMULATING HORMONE: FSH: 2.1 m[IU]/mL

## 2016-12-27 LAB — IGP, APTIMA HPV, RFX 16/18,45
HPV Aptima: NEGATIVE
PAP SMEAR COMMENT: 0

## 2017-03-10 ENCOUNTER — Encounter: Payer: 59 | Admitting: Physician Assistant

## 2017-04-23 ENCOUNTER — Encounter: Payer: Self-pay | Admitting: Physician Assistant

## 2017-04-23 ENCOUNTER — Ambulatory Visit (INDEPENDENT_AMBULATORY_CARE_PROVIDER_SITE_OTHER): Payer: 59 | Admitting: Physician Assistant

## 2017-04-23 VITALS — BP 100/60 | HR 82 | Temp 98.7°F | Resp 16 | Ht 67.0 in | Wt 152.0 lb

## 2017-04-23 DIAGNOSIS — E559 Vitamin D deficiency, unspecified: Secondary | ICD-10-CM

## 2017-04-23 DIAGNOSIS — Z833 Family history of diabetes mellitus: Secondary | ICD-10-CM | POA: Diagnosis not present

## 2017-04-23 DIAGNOSIS — Z0000 Encounter for general adult medical examination without abnormal findings: Secondary | ICD-10-CM | POA: Diagnosis not present

## 2017-04-23 DIAGNOSIS — Z23 Encounter for immunization: Secondary | ICD-10-CM | POA: Diagnosis not present

## 2017-04-23 DIAGNOSIS — E7800 Pure hypercholesterolemia, unspecified: Secondary | ICD-10-CM

## 2017-04-23 NOTE — Patient Instructions (Signed)

## 2017-04-23 NOTE — Progress Notes (Signed)
[name], Female    DOB: 1981-07-22, 36 y.o.   MRN: [id number] Visit Date: 04/23/2017  Today's Provider: Margaretann Loveless, PA-C   Chief Complaint  Patient presents with  . Annual Exam   Subjective:    Annual physical exam Victoria [name] is a 36 y.o. female who presents today for health maintenance and complete physical. She feels well. She reports exercising, walking. She reports she is sleeping fairly well.  Pap:12/24/2016-Normal, HPV-Negative-By OBGYN-Dr. Shawnie Pons -----------------------------------------------------------------   Review of Systems  Constitutional: Negative.   HENT: Negative.   Eyes: Negative.   Respiratory: Negative.   Cardiovascular: Negative.   Gastrointestinal: Negative.   Endocrine: Negative.   Genitourinary: Negative.   Musculoskeletal: Negative.   Skin: Negative.   Allergic/Immunologic: Negative.   Neurological: Negative.   Hematological: Negative.   Psychiatric/Behavioral: Negative.     Social History      She  reports that she quit smoking about 12 years ago. Her smoking use included Cigarettes. She has a 3.00 pack-year smoking history. She has never used smokeless tobacco. She reports that she drinks alcohol. She reports that she does not use drugs.       Social History   Social History  . Marital status: Married    Spouse name: N/A  . Number of children: N/A  . Years of education: N/A   Social History Main Topics  . Smoking status: Former Smoker    Packs/day: 0.50    Years: 6.00    Types: Cigarettes    Quit date: 08/19/2004  . Smokeless tobacco: Never Used     Comment: quit in 2004  . Alcohol use 0.0 oz/week     Comment: OCCASIONALLY  . Drug use: No  . Sexual activity: Yes    Partners: Male    Birth control/ protection: Surgical     Comment: tubaligation.   Other Topics Concern  . None   Social History Narrative  . None    Past Medical History:  Diagnosis Date  . Abnormal Pap smear of cervix     . Calculus of kidney    left distal ureteral stone  . Dyspareunia      Patient Active Problem List   Diagnosis Date Noted  . Hx of renal calculi 12/20/2015  . Hypercholesterolemia without hypertriglyceridemia 02/23/2015  . Caries 02/23/2015  . Avitaminosis D 02/23/2015    Past Surgical History:  Procedure Laterality Date  . CRYOTHERAPY     AGE 51  . LITHOTRIPSY    . TUBAL LIGATION      Family History        Family Status  Relation Status  . Mat Aunt Deceased  . PGF Deceased  . PGM Deceased  . Brother Alive  . Mother Alive  . Father Alive  . Brother Alive        Her family history includes Alcohol abuse in her brother; Breast cancer in her maternal aunt; Cancer in her maternal aunt; Heart attack in her paternal grandfather and paternal grandmother; Heart disease in her paternal grandfather and paternal grandmother; Hypertension in her brother; Pancreatitis in her brother.     Allergies  Allergen Reactions  . Bactrim Other (See Comments)    Dizziness   . Sulfa Antibiotics Other (See Comments)    dizziness     Current Outpatient Prescriptions:  .  ibuprofen (ADVIL,MOTRIN) 800 MG tablet, Take 1 tablet (800 mg total) by mouth 3 (three) times daily with meals as  needed for headache or moderate pain., Disp: 30 tablet, Rfl: 3 .  PROAIR HFA 108 (90 Base) MCG/ACT inhaler, , Disp: , Rfl:  .  loratadine (CLARITIN REDITABS) 10 MG dissolvable tablet, Take 1 tablet (10 mg total) by mouth daily., Disp: 90 tablet, Rfl: 0   Patient Care Team: Chrismon, Jodell Ciproennis E, PA as PCP - General (Physician Assistant)      Objective:   Vitals: BP 100/60 (BP Location: Right Arm, Patient Position: Sitting, Cuff Size: Normal)   Pulse 82   Temp 98.7 F (37.1 C) (Oral)   Resp 16   Ht 5\' 7"  (1.702 m)   Wt 152 lb (68.9 kg)   BMI 23.81 kg/m    Physical Exam  Constitutional: She is oriented to person, place, and time. She appears well-developed and well-nourished. No distress.  HENT:   Head: Normocephalic and atraumatic.  Right Ear: Hearing, tympanic membrane, external ear and ear canal normal.  Left Ear: Hearing, tympanic membrane, external ear and ear canal normal.  Nose: Nose normal.  Mouth/Throat: Uvula is midline, oropharynx is clear and moist and mucous membranes are normal. No oropharyngeal exudate.  Eyes: Pupils are equal, round, and reactive to light. Conjunctivae and EOM are normal. Right eye exhibits no discharge. Left eye exhibits no discharge. No scleral icterus.  Neck: Normal range of motion. Neck supple. No JVD present. No tracheal deviation present. No thyromegaly present.  Cardiovascular: Normal rate, regular rhythm, normal heart sounds and intact distal pulses.  Exam reveals no gallop and no friction rub.   No murmur heard. Pulmonary/Chest: Effort normal and breath sounds normal. No respiratory distress. She has no wheezes. She has no rales. She exhibits no tenderness.  Abdominal: Soft. Bowel sounds are normal. She exhibits no distension and no mass. There is no tenderness. There is no rebound and no guarding.  Genitourinary:  Genitourinary Comments: Done by GYN  Musculoskeletal: Normal range of motion. She exhibits no edema or tenderness.  Lymphadenopathy:    She has no cervical adenopathy.  Neurological: She is alert and oriented to person, place, and time. She has normal reflexes.  Skin: Skin is warm and dry. No rash noted. She is not diaphoretic.  Psychiatric: She has a normal mood and affect. Her behavior is normal. Judgment and thought content normal.  Vitals reviewed.    Depression Screen PHQ 2/9 Scores 04/23/2017  PHQ - 2 Score 0      Assessment & Plan:     Routine Health Maintenance and Physical Exam  Exercise Activities and Dietary recommendations Goals    None      Immunization History  Administered Date(s) Administered  . Tdap 06/05/2010    Health Maintenance  Topic Date Due  . INFLUENZA VACCINE  03/19/2017  . PAP SMEAR   12/25/2019  . TETANUS/TDAP  06/05/2020  . HIV Screening  Completed     Discussed health benefits of physical activity, and encouraged her to engage in regular exercise appropriate for her age and condition.   1. Annual physical exam Normal physical exam today. Will check labs as below and f/u pending lab results. If labs are stable and WNL she will not need to have these rechecked for one year at her next annual physical exam. She is to call the office in the meantime if she has any acute issue, questions or concerns. - CBC with Differential/Platelet - Comprehensive metabolic panel - Hemoglobin A1c - Lipid panel - TSH  2. Hypercholesterolemia without hypertriglyceridemia Will check labs as below and  f/u pending results. - Lipid panel  3. Avitaminosis D Will check labs as below and f/u pending results. - Vitamin D (25 hydroxy)  4. Family history of diabetes mellitus (DM) Will check labs as below and f/u pending results. - Hemoglobin A1c  5. Need for influenza vaccination Flu Vaccine given to patient without complications. Patient sat for 15 minutes after administration and was tolerated well without adverse effects. - Flu Vaccine QUAD 36+ mos IM  --------------------------------------------------------------------    Margaretann Loveless, PA-C  [facility]

## 2017-09-29 ENCOUNTER — Ambulatory Visit: Payer: Managed Care, Other (non HMO) | Admitting: Family Medicine

## 2017-09-29 ENCOUNTER — Encounter: Payer: Self-pay | Admitting: Family Medicine

## 2017-09-29 ENCOUNTER — Other Ambulatory Visit: Payer: Self-pay

## 2017-09-29 VITALS — BP 104/70 | HR 93 | Temp 98.2°F | Resp 16 | Wt 153.0 lb

## 2017-09-29 DIAGNOSIS — B379 Candidiasis, unspecified: Secondary | ICD-10-CM

## 2017-09-29 DIAGNOSIS — T3695XA Adverse effect of unspecified systemic antibiotic, initial encounter: Secondary | ICD-10-CM | POA: Diagnosis not present

## 2017-09-29 DIAGNOSIS — J0140 Acute pansinusitis, unspecified: Secondary | ICD-10-CM

## 2017-09-29 DIAGNOSIS — Z20818 Contact with and (suspected) exposure to other bacterial communicable diseases: Secondary | ICD-10-CM

## 2017-09-29 LAB — POCT RAPID STREP A (OFFICE): RAPID STREP A SCREEN: NEGATIVE

## 2017-09-29 MED ORDER — DOXYCYCLINE HYCLATE 100 MG PO TABS: 100.0000 mg | ORAL_TABLET | Freq: Two times a day (BID) | ORAL | 0 refills | Status: AC

## 2017-09-29 MED ORDER — FLUCONAZOLE 150 MG PO TABS: 150.0000 mg | ORAL_TABLET | Freq: Once | ORAL | 0 refills | Status: AC

## 2017-09-29 NOTE — Progress Notes (Signed)
[name] Female    DOB: 1981-05-30   36 y.o.   MRN: [id number] Visit Date: 09/29/2017  Today's Provider: Shirlee Latch, MD   I, Joslyn Hy, CMA, am acting as scribe for Shirlee Latch, MD.  Chief Complaint  Patient presents with  . URI   Subjective:    URI   This is a new problem. Episode onset: x 3 weeks; pt was started on Augmentin 2.5 weeks ago through [facility]. Since taking abx, pt has noticed vaginal itching. The problem has been gradually worsening. There has been no fever (pt has noticed hot flashes). Associated symptoms include congestion, coughing (productive of green sputum), diarrhea, ear pain (bilateral; left moreso than right), headaches, a plugged ear sensation, rhinorrhea, sinus pain, sneezing and a sore throat (daughter was diagnosed woth strep). Pertinent negatives include no chest pain, dysuria, nausea, neck pain, swollen glands, vomiting or wheezing. Treatments tried: Augmentin, Alka-Seltzer, decongestant. The treatment provided mild relief.   No change in symptoms with Augmentin.  Was getting better last week with clearance of drainage and improvement in headaches.  Congestion and headache the worst symptom now.  Seem to be worsening.     Allergies  Allergen Reactions  . Bactrim Other (See Comments)    Dizziness   . Sulfa Antibiotics Other (See Comments)    dizziness     Current Outpatient Medications:  .  ibuprofen (ADVIL,MOTRIN) 800 MG tablet, Take 1 tablet (800 mg total) by mouth 3 (three) times daily with meals as needed for headache or moderate pain., Disp: 30 tablet, Rfl: 3 .  loratadine (CLARITIN REDITABS) 10 MG dissolvable tablet, Take 1 tablet (10 mg total) by mouth daily., Disp: 90 tablet, Rfl: 0  Review of Systems  HENT: Positive for congestion, ear pain (bilateral; left moreso than right), rhinorrhea, sinus pain, sneezing and sore throat (daughter was diagnosed woth strep).   Respiratory: Positive for cough  (productive of green sputum). Negative for wheezing.   Cardiovascular: Negative for chest pain.  Gastrointestinal: Positive for diarrhea. Negative for nausea and vomiting.  Genitourinary: Negative for dysuria.  Musculoskeletal: Negative for neck pain.  Neurological: Positive for headaches.    Social History   Tobacco Use  . Smoking status: Former Smoker    Packs/day: 0.50    Years: 6.00    Pack years: 3.00    Types: Cigarettes    Last attempt to quit: 08/19/2004    Years since quitting: 13.1  . Smokeless tobacco: Never Used  . Tobacco comment: quit in 2004  Substance Use Topics  . Alcohol use: Yes    Alcohol/week: 0.0 oz    Comment: OCCASIONALLY   Objective:   BP 104/70 (BP Location: Left Arm, Patient Position: Sitting, Cuff Size: Normal)   Pulse 93   Temp 98.2 F (36.8 C) (Oral)   Resp 16   Wt 153 lb (69.4 kg)   LMP 09/02/2017   SpO2 99%   BMI 23.96 kg/m  Vitals:   09/29/17 1334  BP: 104/70  Pulse: 93  Resp: 16  Temp: 98.2 F (36.8 C)  TempSrc: Oral  SpO2: 99%  Weight: 153 lb (69.4 kg)     Physical Exam  Constitutional: She is oriented to person, place, and time. She appears well-developed and well-nourished. No distress.  HENT:  Head: Normocephalic and atraumatic.  Right Ear: Tympanic membrane, external ear and ear canal normal.  Left Ear: Tympanic membrane, external ear and ear canal normal.  Nose: Mucosal edema  present. Right sinus exhibits maxillary sinus tenderness and frontal sinus tenderness. Left sinus exhibits maxillary sinus tenderness and frontal sinus tenderness.  Mouth/Throat: Uvula is midline and mucous membranes are normal. Posterior oropharyngeal erythema present. No oropharyngeal exudate or posterior oropharyngeal edema.  Eyes: Conjunctivae and EOM are normal. Pupils are equal, round, and reactive to light. No scleral icterus.  Neck: Neck supple. No thyromegaly present.  Cardiovascular: Normal rate, regular rhythm, normal heart sounds and  intact distal pulses.  No murmur heard. Pulmonary/Chest: Effort normal and breath sounds normal. No respiratory distress. She has no wheezes. She has no rales.  Abdominal: Soft. She exhibits no distension. There is no tenderness.  Musculoskeletal: She exhibits no edema.  Lymphadenopathy:    She has no cervical adenopathy.  Neurological: She is alert and oriented to person, place, and time.  Skin: Skin is warm and dry. No rash noted.  Psychiatric: She has a normal mood and affect. Her behavior is normal.  Vitals reviewed.   Results for orders placed or performed in visit on 09/29/17  POCT rapid strep A  Result Value Ref Range   Rapid Strep A Screen Negative Negative       Assessment & Plan:   1. Acute non-recurrent pansinusitis - likely initially with viral URI that was not helped by Augmentin course - now with worsening concerning for secondary bacterial sinusitis - will treat with Doxycycline x7 days - recommend symptomatic management as well with decongestant and flonase - return precautions discussed  2. Antibiotic-induced yeast infection - declined pelvic exam today - most likely antibiotic induced yeast vaginitis - will treat with diflucan x1 - may want to wait until finishes Doxycycline course  3. Exposure to strep throat - POCT rapid strep A negative - reassurance given    Meds ordered this encounter  Medications  . doxycycline (VIBRA-TABS) 100 MG tablet    Sig: Take 1 tablet (100 mg total) by mouth 2 (two) times daily for 7 days.    Dispense:  14 tablet    Refill:  0  . fluconazole (DIFLUCAN) 150 MG tablet    Sig: Take 1 tablet (150 mg total) by mouth once for 1 dose.    Dispense:  1 tablet    Refill:  0     Return if symptoms worsen or fail to improve.   The entirety of the information documented in the History of Present Illness, Review of Systems and Physical Exam were personally obtained by me. Portions of this information were initially documented  by Irving BurtonEmily Ratchford, CMA and reviewed by me for thoroughness and accuracy.    Erasmo DownerBacigalupo, Angela M, MD, MPH [facility] 09/29/2017 2:09 PM

## 2017-09-29 NOTE — Patient Instructions (Signed)

## 2017-11-03 ENCOUNTER — Encounter: Payer: Self-pay | Admitting: Radiology

## 2017-12-01 ENCOUNTER — Ambulatory Visit (INDEPENDENT_AMBULATORY_CARE_PROVIDER_SITE_OTHER): Payer: Managed Care, Other (non HMO) | Admitting: Obstetrics and Gynecology

## 2017-12-01 ENCOUNTER — Encounter: Payer: Self-pay | Admitting: Obstetrics and Gynecology

## 2017-12-01 VITALS — BP 118/75 | HR 99 | Ht 66.5 in | Wt 153.0 lb

## 2017-12-01 DIAGNOSIS — N841 Polyp of cervix uteri: Secondary | ICD-10-CM

## 2017-12-01 DIAGNOSIS — R1032 Left lower quadrant pain: Secondary | ICD-10-CM

## 2017-12-01 DIAGNOSIS — Z3202 Encounter for pregnancy test, result negative: Secondary | ICD-10-CM | POA: Diagnosis not present

## 2017-12-01 DIAGNOSIS — Z01419 Encounter for gynecological examination (general) (routine) without abnormal findings: Secondary | ICD-10-CM

## 2017-12-01 LAB — POCT URINALYSIS DIPSTICK
BILIRUBIN UA: NEGATIVE
Glucose, UA: NEGATIVE
KETONES UA: NEGATIVE
Nitrite, UA: NEGATIVE
PROTEIN UA: NEGATIVE
SPEC GRAV UA: 1.02 (ref 1.010–1.025)
Urobilinogen, UA: 0.2 E.U./dL
pH, UA: 6.5 (ref 5.0–8.0)

## 2017-12-01 LAB — POCT URINE PREGNANCY: Preg Test, Ur: NEGATIVE

## 2017-12-01 NOTE — Progress Notes (Signed)
Obstetrics and Gynecology Annual Patient Evaluation  Appointment Date: 12/01/2017  OBGYN Clinic: [facility]  Primary Care Provider: Tamsen Roers  Chief Complaint:  Chief Complaint  Patient presents with  . Gynecologic Exam    History of Present Illness: Victoria [name] is a 37 y.o. Caucasian G2P2002 (Patient's last menstrual period was 11/17/2017.), seen for the above chief complaint. Her past medical history is significant for BTL.   Patient states she's had off and on LLQ discomfort for the past few days. She endorses suprapubic pressure for about a month. No prior s/s.     No breast s/s, fevers, chills, nausea, vomiting, abdominal pain, dysuria, hematuria, vaginal itching, dyspareunia, diarrhea, constipation. ?increased urinary frequency  Review of Systems: as noted in the History of Present Illness.  Past Medical History:  Past Medical History:  Diagnosis Date  . Abnormal Pap smear of cervix   . Calculus of kidney    left distal ureteral stone  . Dyspareunia     Past Surgical History:  Past Surgical History:  Procedure Laterality Date  . CRYOTHERAPY     AGE 62  . LITHOTRIPSY    . TUBAL LIGATION      Past Obstetrical History:  OB History  Gravida Para Term Preterm AB Living  2 2 2  0 0 2  SAB TAB Ectopic Multiple Live Births  0 0 0 0 2    # Outcome Date GA Lbr Len/2nd Weight Sex Delivery Anes PTL Lv  2 Term 02/27/10 [redacted]w[redacted]d  7 lb (3.175 kg) F Vag-Spont EPI  LIV  1 Term 04/13/04    M Vag-Spont   LIV    Past Gynecological History: As per HPI. Periods: qmonth, 6-7d, somewhat heavy but not particularly painful. Had odd period last period with few days of spotting and then actual period.  History of Pap Smear(s): Yes.   Last pap NILM/HPV neg She is currently using BTL for contraception.   Social History:  Social History   Socioeconomic History  . Marital status: Married    Spouse name: Not on file  . Number of children:  Not on file  . Years of education: Not on file  . Highest education level: Not on file  Occupational History  . Not on file  Social Needs  . Financial resource strain: Not on file  . Food insecurity:    Worry: Not on file    Inability: Not on file  . Transportation needs:    Medical: Not on file    Non-medical: Not on file  Tobacco Use  . Smoking status: Former Smoker    Packs/day: 0.50    Years: 6.00    Pack years: 3.00    Types: Cigarettes    Last attempt to quit: 08/19/2004    Years since quitting: 13.2  . Smokeless tobacco: Never Used  . Tobacco comment: quit in 2004  Substance and Sexual Activity  . Alcohol use: Yes    Alcohol/week: 0.0 oz    Comment: OCCASIONALLY  . Drug use: No  . Sexual activity: Yes    Partners: Male    Birth control/protection: Surgical    Comment: tubaligation.  Lifestyle  . Physical activity:    Days per week: Not on file    Minutes per session: Not on file  . Stress: Not on file  Relationships  . Social connections:    Talks on phone: Not on file    Gets together: Not on file  Attends religious service: Not on file    Active member of club or organization: Not on file    Attends meetings of clubs or organizations: Not on file    Relationship status: Not on file  . Intimate partner violence:    Fear of current or ex partner: Not on file    Emotionally abused: Not on file    Physically abused: Not on file    Forced sexual activity: Not on file  Other Topics Concern  . Not on file  Social History Narrative  . Not on file    Family History:  Family History  Problem Relation Age of Onset  . Breast cancer Maternal Aunt   . Cancer Maternal Aunt        breast  . Heart attack Paternal Grandfather   . Heart disease Paternal Grandfather        heart atack  . Heart attack Paternal Grandmother   . Heart disease Paternal Grandmother        heart attack  . Hypertension Brother   . Alcohol abuse Brother   . Pancreatitis Brother     Medications Gwen HerHeather D. Paulos had no medications administered during this visit. Current Outpatient Medications  Medication Sig Dispense Refill  . ibuprofen (ADVIL,MOTRIN) 800 MG tablet Take 1 tablet (800 mg total) by mouth 3 (three) times daily with meals as needed for headache or moderate pain. 30 tablet 3  . loratadine (CLARITIN REDITABS) 10 MG dissolvable tablet Take 1 tablet (10 mg total) by mouth daily. 90 tablet 0   No current facility-administered medications for this visit.     Allergies Bactrim and Sulfa antibiotics   Physical Exam:  BP 118/75   Pulse 99   Ht 5' 6.5" (1.689 m)   Wt 153 lb (69.4 kg)   LMP 11/17/2017   BMI 24.32 kg/m  Body mass index is 24.32 kg/m. Weight last year: 151 lbs General appearance: Well nourished, well developed female in no acute distress.  Neck:  Supple, normal appearance, and no thyromegaly  Cardiovascular: normal s1 and s2.  No murmurs, rubs or gallops. Respiratory:  Clear to auscultation bilateral. Normal respiratory effort Abdomen: positive bowel sounds and no masses, hernias; diffusely non tender to palpation, non distended Breasts: breasts appear normal, no suspicious masses, no skin or nipple changes or axillary nodes, and normal palpation. Neuro/Psych:  Normal mood and affect.  Skin:  Warm and dry.  Lymphatic:  No inguinal lymphadenopathy.   Pelvic exam: is not limited by body habitus EGBUS: within normal limits, Vagina: within normal limits and with no blood or discharge in the vault, Cervix: normal appearing cervix without tenderness, discharge. 1.5cm benign appearing cervical polyp seen at 4 o'clock coming off the external os. Uterus:  nonenlarged and non tender and Adnexa:  normal adnexa and no mass, fullness, tenderness Rectovaginal: deferred  D/w pt re: cervical polyp. No post coital s/s and I told her that we can keep an eye on it or remove it. She'd like it removed so that was done today. See procedure note for  uncomplicated removal  Laboratory: UPT neg. U/a +blood  Radiology: none  Assessment: pt doing well  Plan:  1. Well woman exam with routine gynecological exam Routine care. Pap would like pap today. Pt declines hep c. Would like to get it done at pcp.  - Pap IG and HPV (high risk) DNA detection - Hepatitis B surface antigen - RPR - HIV antibody - NuSwab VG+, Candida 6sp - POCT  Urinalysis Dipstick - POCT urine pregnancy - CULTURE, URINE COMPREHENSIVE - Surgical pathology  2. Cervical polyp Follow up path - Surgical pathology  3. LLQ pain F/u UCx. D/w her that likely mittelschermz pain. Since not recurrent, ER precautions given to patient likely should go away by a few weeks  RTC 1 year.   Cornelia Copa MD Attending [facility] for [facility])

## 2017-12-01 NOTE — Progress Notes (Signed)
C/o pelvic pressure and left ovarian pain.

## 2017-12-01 NOTE — Procedures (Signed)
Cervical Polypectomy Procedure Note  Pre-operative Diagnosis: 1.5cm benign appearing external os cervical polyp   Post-operative Diagnosis: same  Procedure Details  The risks (including infection, bleeding, pain) and benefits of the procedure were explained to the patient and written informed consent was obtained.  The patient was placed in the dorsal lithotomy position. A Pederson was speculum inserted in the vagina, and the cervix was visualized. Cervix: normal appearing cervix without tenderness, discharge. 1.5cm benign appearing cervical polyp seen at 4 o'clock coming off the external os. After spraying with lidocaine spray, using ringed forceps, the base was grasped and the polyp easily twisted off. No bleeding after procedure with application of silver nitrate.    Plan: The patient was advised to call for any fever or for prolonged or severe pain or bleeding. She was advised to use OTC analgesics as needed for mild to moderate pain. She was advised to avoid vaginal intercourse for 5 days.    Victoria [name], Jr MD Attending [facility](Faculty Practice)

## 2017-12-02 LAB — HIV ANTIBODY (ROUTINE TESTING W REFLEX): HIV Screen 4th Generation wRfx: NONREACTIVE

## 2017-12-02 LAB — HEPATITIS B SURFACE ANTIGEN: HEP B S AG: NEGATIVE

## 2017-12-02 LAB — RPR: RPR Ser Ql: NONREACTIVE

## 2017-12-03 LAB — PAP IG AND HPV HIGH-RISK
HPV, high-risk: NEGATIVE
PAP SMEAR COMMENT: 0

## 2017-12-03 LAB — CULTURE, URINE COMPREHENSIVE

## 2017-12-04 LAB — NUSWAB VG+, CANDIDA 6SP
CANDIDA KRUSEI, NAA: NEGATIVE
CANDIDA PARAPSILOSIS, NAA: NEGATIVE
CANDIDA TROPICALIS, NAA: NEGATIVE
CHLAMYDIA TRACHOMATIS, NAA: NEGATIVE
Candida albicans, NAA: NEGATIVE
Candida glabrata, NAA: NEGATIVE
Candida lusitaniae, NAA: NEGATIVE
Neisseria gonorrhoeae, NAA: NEGATIVE
TRICH VAG BY NAA: NEGATIVE

## 2017-12-29 ENCOUNTER — Ambulatory Visit: Payer: 59 | Admitting: Family Medicine

## 2018-03-23 ENCOUNTER — Encounter: Payer: Self-pay | Admitting: Physician Assistant

## 2018-03-23 ENCOUNTER — Telehealth: Payer: Self-pay | Admitting: Physician Assistant

## 2018-03-23 ENCOUNTER — Ambulatory Visit: Payer: Managed Care, Other (non HMO) | Admitting: Physician Assistant

## 2018-03-23 VITALS — BP 110/80 | HR 85 | Temp 98.2°F | Resp 16 | Wt 151.8 lb

## 2018-03-23 DIAGNOSIS — B379 Candidiasis, unspecified: Secondary | ICD-10-CM

## 2018-03-23 DIAGNOSIS — J0140 Acute pansinusitis, unspecified: Secondary | ICD-10-CM

## 2018-03-23 DIAGNOSIS — J029 Acute pharyngitis, unspecified: Secondary | ICD-10-CM

## 2018-03-23 DIAGNOSIS — T3695XA Adverse effect of unspecified systemic antibiotic, initial encounter: Secondary | ICD-10-CM

## 2018-03-23 LAB — POCT RAPID STREP A (OFFICE): Rapid Strep A Screen: NEGATIVE

## 2018-03-23 MED ORDER — AMOXICILLIN-POT CLAVULANATE 875-125 MG PO TABS: 1.0000 | ORAL_TABLET | Freq: Two times a day (BID) | ORAL | 0 refills | Status: DC

## 2018-03-23 MED ORDER — FLUCONAZOLE 150 MG PO TABS: 150.0000 mg | ORAL_TABLET | Freq: Once | ORAL | 0 refills | Status: AC

## 2018-03-23 NOTE — Patient Instructions (Signed)

## 2018-03-23 NOTE — Telephone Encounter (Signed)
She can go in at [employer]4pm

## 2018-03-23 NOTE — Telephone Encounter (Signed)
Patient called for an appt but no one has anything available today.  She said she has a horrible sore throat, feels like it's on fire, her face is pounding under eyes, ears ache.   Can you work her in this afternoon?

## 2018-03-23 NOTE — Progress Notes (Signed)
[name] Female    DOB: 12/21/1980   37 y.o.   MRN: [id number] Visit Date: 03/23/2018  Today's Provider: Margaretann LovelessJennifer M Burnette, PA-C   Chief Complaint  Patient presents with  . URI   Subjective:    URI   This is a new problem. The problem has been gradually worsening. There has been no fever. Associated symptoms include congestion, coughing, ear pain, headaches, sinus pain and a sore throat ("burns"). Pertinent negatives include no wheezing. She has tried decongestant (Advil cold and Sinus.) for the symptoms. The treatment provided no relief.      Allergies  Allergen Reactions  . Bactrim Other (See Comments)    Dizziness   . Sulfa Antibiotics Other (See Comments)    dizziness     Current Outpatient Medications:  .  ibuprofen (ADVIL,MOTRIN) 800 MG tablet, Take 1 tablet (800 mg total) by mouth 3 (three) times daily with meals as needed for headache or moderate pain., Disp: 30 tablet, Rfl: 3 .  loratadine (CLARITIN REDITABS) 10 MG dissolvable tablet, Take 1 tablet (10 mg total) by mouth daily., Disp: 90 tablet, Rfl: 0  Review of Systems  Constitutional: Positive for chills.  HENT: Positive for congestion, ear pain, postnasal drip, sinus pressure, sinus pain, sore throat ("burns") and trouble swallowing.   Respiratory: Positive for cough. Negative for chest tightness, shortness of breath and wheezing.   Neurological: Positive for headaches.    Social History   Tobacco Use  . Smoking status: Former Smoker    Packs/day: 0.50    Years: 6.00    Pack years: 3.00    Types: Cigarettes    Last attempt to quit: 08/19/2004    Years since quitting: 13.6  . Smokeless tobacco: Never Used  . Tobacco comment: quit in 2004  Substance Use Topics  . Alcohol use: Yes    Alcohol/week: 0.0 oz    Comment: OCCASIONALLY   Objective:   BP 110/80 (BP Location: Right Arm, Patient Position: Sitting, Cuff Size: Normal)   Pulse 85   Temp 98.2 F (36.8 C) (Oral)   Resp 16    Wt 151 lb 12.8 oz (68.9 kg)   SpO2 98%   BMI 24.13 kg/m  Vitals:   03/23/18 1557  BP: 110/80  Pulse: 85  Resp: 16  Temp: 98.2 F (36.8 C)  TempSrc: Oral  SpO2: 98%  Weight: 151 lb 12.8 oz (68.9 kg)     Physical Exam  Constitutional: She appears well-developed and well-nourished. No distress.  HENT:  Head: Normocephalic and atraumatic.  Right Ear: Hearing, external ear and ear canal normal. There is tenderness. Tympanic membrane is erythematous. Tympanic membrane is not perforated, not retracted and not bulging. A middle ear effusion is present.  Left Ear: Hearing, external ear and ear canal normal. No tenderness. Tympanic membrane is not perforated, not erythematous, not retracted and not bulging. A middle ear effusion is present.  Nose: Right sinus exhibits maxillary sinus tenderness and frontal sinus tenderness. Left sinus exhibits maxillary sinus tenderness and frontal sinus tenderness.  Mouth/Throat: Uvula is midline and mucous membranes are normal. Posterior oropharyngeal edema and posterior oropharyngeal erythema present. No oropharyngeal exudate.  Neck: Normal range of motion. Neck supple. No tracheal deviation present. No thyromegaly present.  Cardiovascular: Normal rate, regular rhythm and normal heart sounds. Exam reveals no gallop and no friction rub.  No murmur heard. Pulmonary/Chest: Effort normal and breath sounds normal. No stridor. No respiratory distress. She has no  wheezes. She has no rales.  Lymphadenopathy:    She has no cervical adenopathy.  Skin: She is not diaphoretic.  Vitals reviewed.      Assessment & Plan:     1. Acute pansinusitis, recurrence not specified Worsening symptoms that have not responded to OTC medications. Will give augmentin as below. Continue allergy medications. Stay well hydrated and get plenty of rest. Call if no symptom improvement or if symptoms worsen. - amoxicillin-clavulanate (AUGMENTIN) 875-125 MG tablet; Take 1 tablet by  mouth 2 (two) times daily.  Dispense: 20 tablet; Refill: 0  2. Sore throat Rapid strep was negative.  - POCT rapid strep A  3. Antibiotic-induced yeast infection - fluconazole (DIFLUCAN) 150 MG tablet; Take 1 tablet (150 mg total) by mouth once for 1 dose. May repeat in 48-72 hrs if needed  Dispense: 2 tablet; Refill: 0       Margaretann Loveless, PA-C  [facility]

## 2018-04-27 ENCOUNTER — Encounter: Payer: Managed Care, Other (non HMO) | Admitting: Physician Assistant

## 2018-05-19 ENCOUNTER — Ambulatory Visit (INDEPENDENT_AMBULATORY_CARE_PROVIDER_SITE_OTHER): Payer: Managed Care, Other (non HMO) | Admitting: Obstetrics & Gynecology

## 2018-05-19 ENCOUNTER — Encounter: Payer: Self-pay | Admitting: Obstetrics & Gynecology

## 2018-05-19 VITALS — BP 110/77 | HR 75 | Resp 16 | Ht 66.5 in | Wt 149.6 lb

## 2018-05-19 DIAGNOSIS — R1030 Lower abdominal pain, unspecified: Secondary | ICD-10-CM | POA: Diagnosis not present

## 2018-05-19 DIAGNOSIS — N368 Other specified disorders of urethra: Secondary | ICD-10-CM

## 2018-05-19 LAB — POCT URINALYSIS DIP (MANUAL ENTRY)
Bilirubin, UA: NEGATIVE
GLUCOSE UA: NEGATIVE mg/dL
Ketones, POC UA: NEGATIVE mg/dL
Leukocytes, UA: NEGATIVE
NITRITE UA: NEGATIVE
PH UA: 6 (ref 5.0–8.0)
PROTEIN UA: NEGATIVE mg/dL
RBC UA: NEGATIVE
Spec Grav, UA: 1.01 (ref 1.010–1.025)
UROBILINOGEN UA: 0.2 U/dL

## 2018-05-19 NOTE — Progress Notes (Signed)
GYNECOLOGY OFFICE VISIT NOTE  History:  37 y.o. [id number] here today with report of lower abdominal pain and pain in the upper part of her vagina for 1-2 weeks. No dysuria, no fevers, but reports suprapubic pain and left flank pain. Concerned about possible kidney stone.. She denies any abnormal vaginal discharge, bleeding or other concerns.   Past Medical History:  Diagnosis Date  . Abnormal Pap smear of cervix   . Calculus of kidney    left distal ureteral stone  . Dyspareunia     Past Surgical History:  Procedure Laterality Date  . CRYOTHERAPY     AGE 55  . LITHOTRIPSY    . TUBAL LIGATION      The following portions of the patient's history were reviewed and updated as appropriate: allergies, current medications, past family history, past medical history, past social history, past surgical history and problem list.   Health Maintenance:  Normal pap and negative HRHPV on 12/01/2017.   Review of Systems:  Pertinent items noted in HPI and remainder of comprehensive ROS otherwise negative.  Objective:  Physical Exam BP 110/77 (BP Location: Left Arm, Patient Position: Sitting, Cuff Size: Normal)   Pulse 75   Resp 16   Ht 5' 6.5" (1.689 m)   Wt 149 lb 9.6 oz (67.9 kg)   LMP 05/17/2018 (Exact Date)   BMI 23.78 kg/m  CONSTITUTIONAL: Well-developed, well-nourished female in no acute distress.  HEENT:  Normocephalic, atraumatic. External right and left ear normal. No scleral icterus.  NECK: Normal range of motion, supple, no masses noted on observation SKIN: Skin is warm and dry. No rash noted. Not diaphoretic. No erythema. No pallor. MUSCULOSKELETAL: Normal range of motion. No edema noted. NEUROLOGIC: Alert and oriented to person, place, and time. Normal muscle tone coordination. No cranial nerve deficit noted. PSYCHIATRIC: Normal mood and affect. Normal behavior. Normal judgment and thought content. CARDIOVASCULAR: Normal heart rate noted RESPIRATORY: Effort and breath sounds  normal, no problems with respiration noted ABDOMEN: Soft, no distention noted, mild suprapubic pain.   PELVIC: Normal appearing external genitalia with some tender fullness noted under urethral meatus. No erythema, no fluctuance, no abscess noted. Normal appearing distal vaginal mucosa.  No abnormal discharge noted.    Labs and Imaging Results for orders placed or performed in visit on 05/19/18 (from the past 168 hour(s))  POCT urinalysis dipstick   Collection Time: 05/19/18 11:52 AM  Result Value Ref Range   Color, UA yellow yellow   Clarity, UA clear clear   Glucose, UA negative negative mg/dL   Bilirubin, UA negative negative   Ketones, POC UA negative negative mg/dL   Spec Grav, UA 4.540 9.811 - 1.025   Blood, UA negative negative   pH, UA 6.0 5.0 - 8.0   Protein Ur, POC negative negative mg/dL   Urobilinogen, UA 0.2 0.2 or 1.0 E.U./dL   Nitrite, UA Negative Negative   Leukocytes, UA Negative Negative   No results found.  Assessment & Plan:  1. Urethral meatus pain and pressure 2. Lower abdominal pain - POCT urinalysis dipstick - Urine Culture Possible urethral gland/Skene's gland irritation.  Recommended warm soaks, NSAIDs as needed for now. If symptoms worsen, may need Urology referral.  She was told to come back/call for any gynecologic concerns.  Return for any gynecologic concerns.   Total face-to-face time with patient: 15 minutes.  Over 50% of encounter was spent on counseling and coordination of care.   Jaynie Collins, MD, FACOG Obstetrician & Gynecologist,  Product/process development scientist for [employer], [place]

## 2018-05-19 NOTE — Patient Instructions (Signed)
Return to clinic for any scheduled appointments or for any gynecologic concerns as needed.   

## 2018-05-20 LAB — URINE CULTURE

## 2018-05-29 ENCOUNTER — Encounter: Payer: Self-pay | Admitting: Physician Assistant

## 2018-06-08 ENCOUNTER — Other Ambulatory Visit: Payer: Self-pay | Admitting: Obstetrics & Gynecology

## 2018-06-08 DIAGNOSIS — N368 Other specified disorders of urethra: Secondary | ICD-10-CM

## 2018-06-08 NOTE — Progress Notes (Signed)
Urology referral placed as per patient's request.

## 2018-07-27 ENCOUNTER — Telehealth: Payer: Self-pay

## 2018-07-27 DIAGNOSIS — J0140 Acute pansinusitis, unspecified: Secondary | ICD-10-CM

## 2018-07-27 MED ORDER — AMOXICILLIN-POT CLAVULANATE 875-125 MG PO TABS: 1.0000 | ORAL_TABLET | Freq: Two times a day (BID) | ORAL | 0 refills | Status: DC

## 2018-07-27 NOTE — Telephone Encounter (Signed)
Patient advised as below.  

## 2018-07-27 NOTE — Telephone Encounter (Signed)
Patient had called the office stating that she had spoken with a tele-doctor 2 days ago with complaints of sinus like symptoms for two weeks. Patient states that she was diagnosed with a sinus infection and prescribed a z-pack. Patient states that her symptoms are not any better and is now complaining of a sore throat and swollen lymph nodes. I advised patient that she should come in office to be assessed and treated but patient wanted to know if physician can call in a different prescription? Please review chart and advise. KW

## 2018-07-27 NOTE — Telephone Encounter (Signed)
Stop Zpak, sent in augmentin.

## 2018-10-07 ENCOUNTER — Encounter: Payer: Self-pay | Admitting: Radiology

## 2018-11-02 ENCOUNTER — Encounter: Payer: Managed Care, Other (non HMO) | Admitting: Physician Assistant

## 2018-11-27 ENCOUNTER — Encounter: Payer: Managed Care, Other (non HMO) | Admitting: Physician Assistant

## 2018-12-14 ENCOUNTER — Ambulatory Visit: Payer: Managed Care, Other (non HMO) | Admitting: Obstetrics and Gynecology

## 2019-01-14 ENCOUNTER — Encounter: Payer: Managed Care, Other (non HMO) | Admitting: Physician Assistant

## 2019-01-22 NOTE — Progress Notes (Signed)
[name], Female    DOB: [birth year], 38 y.o.   MRN: [id number] Visit Date: 01/25/2019  Today's Provider: Margaretann Loveless, PA-C   Chief Complaint  Patient presents with  . Annual Exam   Subjective:     Annual physical exam Victoria [name] is a 38 y.o. female who presents today for health maintenance and complete physical. She feels well. She reports exercising, some walking. She reports she is sleeping well. ---------------------------------------------------------------  Review of Systems  Constitutional: Negative.   HENT: Positive for sinus pressure.   Eyes: Negative.   Respiratory: Negative.   Cardiovascular: Negative.   Gastrointestinal: Negative.   Endocrine: Negative.   Genitourinary: Negative.   Musculoskeletal: Negative.   Skin: Negative.   Allergic/Immunologic: Negative.   Neurological: Negative.   Hematological: Negative.   Psychiatric/Behavioral: Negative.     Social History      She  reports that she quit smoking about 14 years ago. Her smoking use included cigarettes. She has a 3.00 pack-year smoking history. She has never used smokeless tobacco. She reports current alcohol use. She reports that she does not use drugs.       Social History   Socioeconomic History  . Marital status: Married    Spouse name: Not on file  . Number of children: Not on file  . Years of education: Not on file  . Highest education level: Not on file  Occupational History  . Not on file  Social Needs  . Financial resource strain: Not on file  . Food insecurity:    Worry: Not on file    Inability: Not on file  . Transportation needs:    Medical: Not on file    Non-medical: Not on file  Tobacco Use  . Smoking status: Former Smoker    Packs/day: 0.50    Years: 6.00    Pack years: 3.00    Types: Cigarettes    Last attempt to quit: 08/19/2004    Years since quitting: 14.4  . Smokeless tobacco: Never Used  . Tobacco comment: quit in 2004  Substance  and Sexual Activity  . Alcohol use: Yes    Alcohol/week: 0.0 standard drinks    Comment: OCCASIONALLY  . Drug use: No  . Sexual activity: Yes    Partners: Male    Birth control/protection: Surgical    Comment: tubaligation.  Lifestyle  . Physical activity:    Days per week: Not on file    Minutes per session: Not on file  . Stress: Not on file  Relationships  . Social connections:    Talks on phone: Not on file    Gets together: Not on file    Attends religious service: Not on file    Active member of club or organization: Not on file    Attends meetings of clubs or organizations: Not on file    Relationship status: Not on file  Other Topics Concern  . Not on file  Social History Narrative  . Not on file    Past Medical History:  Diagnosis Date  . Abnormal Pap smear of cervix   . Calculus of kidney    left distal ureteral stone  . Dyspareunia      Patient Active Problem List   Diagnosis Date Noted  . Hx of renal calculi 12/20/2015  . Hypercholesterolemia without hypertriglyceridemia 02/23/2015  . Caries 02/23/2015  . Avitaminosis D 02/23/2015    Past Surgical History:  Procedure Laterality  Date  . CRYOTHERAPY     AGE 77  . LITHOTRIPSY    . TUBAL LIGATION      Family History        Family Status  Relation Name Status  . Mat Aunt  Deceased  . PGF  Deceased  . PGM  Deceased  . Brother  Alive  . Mother  Alive  . Father  Alive  . Brother  Alive        Her family history includes Alcohol abuse in her brother; Breast cancer in her maternal aunt; Cancer in her maternal aunt; Heart attack in her paternal grandfather and paternal grandmother; Heart disease in her paternal grandfather and paternal grandmother; Hypertension in her brother; Pancreatitis in her brother.      Allergies  Allergen Reactions  . Bactrim Other (See Comments)    Dizziness   . Sulfa Antibiotics Other (See Comments)    dizziness     Current Outpatient Medications:  .  ibuprofen  (ADVIL,MOTRIN) 800 MG tablet, Take 1 tablet (800 mg total) by mouth 3 (three) times daily with meals as needed for headache or moderate pain., Disp: 30 tablet, Rfl: 3 .  loratadine (CLARITIN REDITABS) 10 MG dissolvable tablet, Take 1 tablet (10 mg total) by mouth daily., Disp: 90 tablet, Rfl: 0 .  MYRBETRIQ 25 MG TB24 tablet, , Disp: , Rfl:  .  amoxicillin-clavulanate (AUGMENTIN) 875-125 MG tablet, Take 1 tablet by mouth 2 (two) times daily. (Patient not taking: Reported on 01/25/2019), Disp: 20 tablet, Rfl: 0   Patient Care Team: Margaretann Loveless, PA-C as PCP - General (Family Medicine)    Objective:    Vitals: BP 115/82 (BP Location: Left Arm, Patient Position: Sitting, Cuff Size: Large)   Pulse 68   Temp 98.2 F (36.8 C) (Oral)   Resp 16   Ht  (1.676 m)   Wt 157 lb (71.2 kg)   LMP 12/30/2018   SpO2 97%   BMI 25.34 kg/m    Vitals:   01/25/19 1407  BP: 115/82  Pulse: 68  Resp: 16  Temp: 98.2 F (36.8 C)  TempSrc: Oral  SpO2: 97%  Weight: 157 lb (71.2 kg)  Height:  (1.676 m)     Physical Exam Vitals signs reviewed.  Constitutional:      General: She is not in acute distress.    Appearance: Normal appearance. She is well-developed and normal weight. She is not ill-appearing or diaphoretic.  HENT:     Head: Normocephalic and atraumatic.     Right Ear: Tympanic membrane, ear canal and external ear normal.     Left Ear: Tympanic membrane, ear canal and external ear normal.     Nose: Nose normal.     Mouth/Throat:     Mouth: Mucous membranes are moist.     Pharynx: No oropharyngeal exudate.  Eyes:     General: No scleral icterus.       Right eye: No discharge.        Left eye: No discharge.     Extraocular Movements: Extraocular movements intact.     Conjunctiva/sclera: Conjunctivae normal.     Pupils: Pupils are equal, round, and reactive to light.  Neck:     Musculoskeletal: Normal range of motion and neck supple.     Thyroid: No thyromegaly.      Vascular: No JVD.     Trachea: No tracheal deviation.  Cardiovascular:     Rate and Rhythm: Normal rate and regular  rhythm.     Pulses: Normal pulses.     Heart sounds: Normal heart sounds. No murmur. No friction rub. No gallop.   Pulmonary:     Effort: Pulmonary effort is normal. No respiratory distress.     Breath sounds: Normal breath sounds. No wheezing or rales.  Chest:     Chest wall: No tenderness.  Abdominal:     General: Bowel sounds are normal. There is no distension.     Palpations: Abdomen is soft. There is no mass.     Tenderness: There is no abdominal tenderness. There is no guarding or rebound.  Genitourinary:    Comments: Deferred to GYN Musculoskeletal: Normal range of motion.        General: No tenderness.  Lymphadenopathy:     Cervical: No cervical adenopathy.  Skin:    General: Skin is warm and dry.     Capillary Refill: Capillary refill takes less than 2 seconds.     Findings: No rash.  Neurological:     General: No focal deficit present.     Mental Status: She is alert and oriented to person, place, and time. Mental status is at baseline.     Cranial Nerves: No cranial nerve deficit.  Psychiatric:        Behavior: Behavior normal.        Thought Content: Thought content normal.        Judgment: Judgment normal.      Depression Screen PHQ 2/9 Scores 01/25/2019 04/23/2017  PHQ - 2 Score 0 0  PHQ- 9 Score 0 -       Assessment & Plan:     Routine Health Maintenance and Physical Exam  Exercise Activities and Dietary recommendations Goals   None     Immunization History  Administered Date(s) Administered  . Influenza,inj,Quad PF,6+ Mos 04/23/2017  . Tdap 06/05/2010    Health Maintenance  Topic Date Due  . INFLUENZA VACCINE  03/20/2019  . TETANUS/TDAP  06/05/2020  . PAP SMEAR-Modifier  12/01/2020  . HIV Screening  Completed     Discussed health benefits of physical activity, and encouraged her to engage in regular exercise appropriate  for her age and condition.    1. Annual physical exam Normal physical exam today. Will check labs as below and f/u pending lab results. If labs are stable and WNL she will not need to have these rechecked for one year at her next annual physical exam. She is to call the office in the meantime if she has any acute issue, questions or concerns. - CBC w/Diff/Platelet - Comprehensive Metabolic Panel (CMET) - TSH - Lipid Profile - HgB A1c  2. Avitaminosis D H/O this. Will check labs as below and f/u pending results. - Vitamin D (25 hydroxy)  3. Hypercholesterolemia without hypertriglyceridemia Diet controlled. Will check labs as below and f/u pending results. - Comprehensive Metabolic Panel (CMET) - Lipid Profile - HgB A1c  --------------------------------------------------------------------    Margaretann LovelessJennifer M Sharah Finnell, PA-C  [facility]

## 2019-01-25 ENCOUNTER — Other Ambulatory Visit: Payer: Self-pay

## 2019-01-25 ENCOUNTER — Ambulatory Visit (INDEPENDENT_AMBULATORY_CARE_PROVIDER_SITE_OTHER): Payer: Managed Care, Other (non HMO) | Admitting: Physician Assistant

## 2019-01-25 ENCOUNTER — Encounter: Payer: Self-pay | Admitting: Physician Assistant

## 2019-01-25 VITALS — BP 115/82 | HR 68 | Temp 98.2°F | Resp 16 | Ht 66.0 in | Wt 157.0 lb

## 2019-01-25 DIAGNOSIS — Z0000 Encounter for general adult medical examination without abnormal findings: Secondary | ICD-10-CM

## 2019-01-25 DIAGNOSIS — E7800 Pure hypercholesterolemia, unspecified: Secondary | ICD-10-CM

## 2019-01-25 DIAGNOSIS — E559 Vitamin D deficiency, unspecified: Secondary | ICD-10-CM

## 2019-01-25 NOTE — Patient Instructions (Signed)

## 2019-02-02 ENCOUNTER — Telehealth: Payer: Self-pay | Admitting: Radiology

## 2019-02-02 NOTE — Telephone Encounter (Signed)
Left message for patient to call [id number] to reschedule Annual Exam (last 12/01/17) with Dr Ilda Basset, cancelled for COVID 19

## 2019-02-16 ENCOUNTER — Ambulatory Visit: Payer: Managed Care, Other (non HMO) | Admitting: Family Medicine

## 2019-02-23 LAB — LIPID PANEL
Chol/HDL Ratio: 3.8 ratio (ref 0.0–4.4)
Cholesterol, Total: 202 mg/dL — ABNORMAL HIGH (ref 100–199)
HDL: 53 mg/dL (ref >39)
LDL Calculated: 136 mg/dL — ABNORMAL HIGH (ref 0–99)
Triglycerides: 67 mg/dL (ref 0–149)
VLDL Cholesterol Cal: 13 mg/dL (ref 5–40)

## 2019-02-23 LAB — CBC WITH DIFFERENTIAL/PLATELET
Basophils Absolute: 0 10*3/uL (ref 0.0–0.2)
Basos: 1 %
EOS (ABSOLUTE): 0.1 10*3/uL (ref 0.0–0.4)
Eos: 1 %
Hematocrit: 38 % (ref 34.0–46.6)
Hemoglobin: 12.5 g/dL (ref 11.1–15.9)
Immature Grans (Abs): 0 10*3/uL (ref 0.0–0.1)
Immature Granulocytes: 0 %
Lymphocytes Absolute: 1.6 10*3/uL (ref 0.7–3.1)
Lymphs: 30 %
MCH: 29.6 pg (ref 26.6–33.0)
MCHC: 32.9 g/dL (ref 31.5–35.7)
MCV: 90 fL (ref 79–97)
Monocytes Absolute: 0.3 10*3/uL (ref 0.1–0.9)
Monocytes: 7 %
Neutrophils Absolute: 3.2 10*3/uL (ref 1.4–7.0)
Neutrophils: 61 %
Platelets: 293 10*3/uL (ref 150–450)
RBC: 4.22 x10E6/uL (ref 3.77–5.28)
RDW: 12.9 % (ref 11.7–15.4)
WBC: 5.3 10*3/uL (ref 3.4–10.8)

## 2019-02-23 LAB — COMPREHENSIVE METABOLIC PANEL
ALT: 12 IU/L (ref 0–32)
AST: 17 IU/L (ref 0–40)
Albumin/Globulin Ratio: 1.8 (ref 1.2–2.2)
Albumin: 4.1 g/dL (ref 3.8–4.8)
Alkaline Phosphatase: 57 IU/L (ref 39–117)
BUN/Creatinine Ratio: 18 (ref 9–23)
BUN: 13 mg/dL (ref 6–20)
Bilirubin Total: 0.4 mg/dL (ref 0.0–1.2)
CO2: 21 mmol/L (ref 20–29)
Calcium: 9 mg/dL (ref 8.7–10.2)
Chloride: 104 mmol/L (ref 96–106)
Creatinine, Ser: 0.73 mg/dL (ref 0.57–1.00)
GFR calc Af Amer: 121 mL/min/{1.73_m2} (ref >59)
GFR calc non Af Amer: 105 mL/min/{1.73_m2} (ref >59)
Globulin, Total: 2.3 g/dL (ref 1.5–4.5)
Glucose: 93 mg/dL (ref 65–99)
Potassium: 4.1 mmol/L (ref 3.5–5.2)
Sodium: 139 mmol/L (ref 134–144)
Total Protein: 6.4 g/dL (ref 6.0–8.5)

## 2019-02-23 LAB — HEMOGLOBIN A1C
Est. average glucose Bld gHb Est-mCnc: 114 mg/dL
Hgb A1c MFr Bld: 5.6 % (ref 4.8–5.6)

## 2019-02-23 LAB — TSH: TSH: 1.5 u[IU]/mL (ref 0.450–4.500)

## 2019-02-23 LAB — VITAMIN D 25 HYDROXY (VIT D DEFICIENCY, FRACTURES): Vit D, 25-Hydroxy: 36.3 ng/mL (ref 30.0–100.0)

## 2019-02-24 ENCOUNTER — Telehealth: Payer: Self-pay

## 2019-02-24 NOTE — Telephone Encounter (Signed)
Patient advised as directed below. 

## 2019-02-24 NOTE — Telephone Encounter (Signed)
-----   Message from Mar Daring, [place] sent at 02/23/2019  5:27 PM EDT ----- All labs are within normal limits and stable.  Thanks! -JB

## 2019-03-22 ENCOUNTER — Ambulatory Visit: Payer: Managed Care, Other (non HMO) | Admitting: Family Medicine

## 2019-04-29 ENCOUNTER — Ambulatory Visit: Payer: Managed Care, Other (non HMO) | Admitting: Family Medicine

## 2019-05-31 ENCOUNTER — Encounter: Payer: Self-pay | Admitting: Family Medicine

## 2019-05-31 ENCOUNTER — Ambulatory Visit (INDEPENDENT_AMBULATORY_CARE_PROVIDER_SITE_OTHER): Payer: Managed Care, Other (non HMO) | Admitting: Family Medicine

## 2019-05-31 ENCOUNTER — Other Ambulatory Visit: Payer: Self-pay

## 2019-05-31 DIAGNOSIS — Z23 Encounter for immunization: Secondary | ICD-10-CM | POA: Diagnosis not present

## 2019-05-31 DIAGNOSIS — Z01419 Encounter for gynecological examination (general) (routine) without abnormal findings: Secondary | ICD-10-CM | POA: Diagnosis not present

## 2019-05-31 DIAGNOSIS — Z124 Encounter for screening for malignant neoplasm of cervix: Secondary | ICD-10-CM

## 2019-05-31 NOTE — Progress Notes (Signed)
  Subjective:     Victoria [name] is a 38 y.o. female and is here for a comprehensive physical exam. The patient reports no problems. S/p BTL, regular cycles.Working from home for [employer] center.  The following portions of the patient's history were reviewed and updated as appropriate: allergies, current medications, past family history, past medical history, past social history, past surgical history and problem list.  Review of Systems Pertinent items noted in HPI and remainder of comprehensive ROS otherwise negative.   Objective:    BP 106/82   Pulse 72   Wt 158 lb 9.6 oz (71.9 kg)   BMI 25.60 kg/m  General appearance: alert, cooperative and appears stated age Head: Normocephalic, without obvious abnormality, atraumatic Neck: no adenopathy, supple, symmetrical, trachea midline and thyroid not enlarged, symmetric, no tenderness/mass/nodules Lungs: clear to auscultation bilaterally Breasts: normal appearance, no masses or tenderness Heart: regular rate and rhythm, S1, S2 normal, no murmur, click, rub or gallop Abdomen: soft, non-tender; bowel sounds normal; no masses,  no organomegaly Pelvic: cervix normal in appearance, external genitalia normal, no adnexal masses or tenderness, no cervical motion tenderness, uterus normal size, shape, and consistency and vagina normal without discharge Extremities: extremities normal, atraumatic, no cyanosis or edema Pulses: 2+ and symmetric Skin: Skin color, texture, turgor normal. No rashes or lesions Lymph nodes: Cervical, supraclavicular, and axillary nodes normal. Neurologic: Grossly normal    Assessment:    Healthy female exam.      Plan:   Problem List Items Addressed This Visit    None    Visit Diagnoses    Screening for malignant neoplasm of cervix       Relevant Orders   IGP, Aptima HPV, rfx 16/18,45   Encounter for gynecological examination without abnormal finding       Need for immunization against influenza       Relevant Orders   Flu vaccine > 3yo with preservative IM (Fluvirin Influenza Split)   Flu Vaccine QUAD 36+ mos IM (Completed)     Return in 1 year (on 05/30/2020).    See After Visit Summary for Counseling Recommendations

## 2019-05-31 NOTE — Patient Instructions (Signed)
 Preventive Care 21-39 Years Old, Female Preventive care refers to visits with your health care provider and lifestyle choices that can promote health and wellness. This includes:  A yearly physical exam. This may also be called an annual well check.  Regular dental visits and eye exams.  Immunizations.  Screening for certain conditions.  Healthy lifestyle choices, such as eating a healthy diet, getting regular exercise, not using drugs or products that contain nicotine and tobacco, and limiting alcohol use. What can I expect for my preventive care visit? Physical exam Your health care provider will check your:  Height and weight. This may be used to calculate body mass index (BMI), which tells if you are at a healthy weight.  Heart rate and blood pressure.  Skin for abnormal spots. Counseling Your health care provider may ask you questions about your:  Alcohol, tobacco, and drug use.  Emotional well-being.  Home and relationship well-being.  Sexual activity.  Eating habits.  Work and work environment.  Method of birth control.  Menstrual cycle.  Pregnancy history. What immunizations do I need?  Influenza (flu) vaccine  This is recommended every year. Tetanus, diphtheria, and pertussis (Tdap) vaccine  You may need a Td booster every 10 years. Varicella (chickenpox) vaccine  You may need this if you have not been vaccinated. Human papillomavirus (HPV) vaccine  If recommended by your health care provider, you may need three doses over 6 months. Measles, mumps, and rubella (MMR) vaccine  You may need at least one dose of MMR. You may also need a second dose. Meningococcal conjugate (MenACWY) vaccine  One dose is recommended if you are age 19-21 years and a first-year college student living in a residence hall, or if you have one of several medical conditions. You may also need additional booster doses. Pneumococcal conjugate (PCV13) vaccine  You may need  this if you have certain conditions and were not previously vaccinated. Pneumococcal polysaccharide (PPSV23) vaccine  You may need one or two doses if you smoke cigarettes or if you have certain conditions. Hepatitis A vaccine  You may need this if you have certain conditions or if you travel or work in places where you may be exposed to hepatitis A. Hepatitis B vaccine  You may need this if you have certain conditions or if you travel or work in places where you may be exposed to hepatitis B. Haemophilus influenzae type b (Hib) vaccine  You may need this if you have certain conditions. You may receive vaccines as individual doses or as more than one vaccine together in one shot (combination vaccines). Talk with your health care provider about the risks and benefits of combination vaccines. What tests do I need?  Blood tests  Lipid and cholesterol levels. These may be checked every 5 years starting at age 20.  Hepatitis C test.  Hepatitis B test. Screening  Diabetes screening. This is done by checking your blood sugar (glucose) after you have not eaten for a while (fasting).  Sexually transmitted disease (STD) testing.  BRCA-related cancer screening. This may be done if you have a family history of breast, ovarian, tubal, or peritoneal cancers.  Pelvic exam and Pap test. This may be done every 3 years starting at age 21. Starting at age 30, this may be done every 5 years if you have a Pap test in combination with an HPV test. Talk with your health care provider about your test results, treatment options, and if necessary, the need for more   tests. Follow these instructions at home: Eating and drinking   Eat a diet that includes fresh fruits and vegetables, whole grains, lean protein, and low-fat dairy.  Take vitamin and mineral supplements as recommended by your health care provider.  Do not drink alcohol if: ? Your health care provider tells you not to drink. ? You are  pregnant, may be pregnant, or are planning to become pregnant.  If you drink alcohol: ? Limit how much you have to 0-1 drink a day. ? Be aware of how much alcohol is in your drink. In the [place] one drink equals one 12 oz bottle of beer (355 mL), one 5 oz glass of wine (148 mL), or one 1 oz glass of hard liquor (44 mL). Lifestyle  Take daily care of your teeth and gums.  Stay active. Exercise for at least 30 minutes on 5 or more days each week.  Do not use any products that contain nicotine or tobacco, such as cigarettes, e-cigarettes, and chewing tobacco. If you need help quitting, ask your health care provider.  If you are sexually active, practice safe sex. Use a condom or other form of birth control (contraception) in order to prevent pregnancy and STIs (sexually transmitted infections). If you plan to become pregnant, see your health care provider for a preconception visit. What's next?  Visit your health care provider once a year for a well check visit.  Ask your health care provider how often you should have your eyes and teeth checked.  Stay up to date on all vaccines. This information is not intended to replace advice given to you by your health care provider. Make sure you discuss any questions you have with your health care provider. Document Released: 10/01/2001 Document Revised: 04/16/2018 Document Reviewed: 04/16/2018 Elsevier Patient Education  2020 [employer].

## 2019-05-31 NOTE — Progress Notes (Signed)
No Sti testing  Fu shot today

## 2019-06-04 LAB — IGP, APTIMA HPV, RFX 16/18,45
HPV Aptima: NEGATIVE
PAP Smear Comment: 0

## 2019-07-05 ENCOUNTER — Other Ambulatory Visit: Payer: Self-pay

## 2019-07-05 DIAGNOSIS — Z20822 Contact with and (suspected) exposure to covid-19: Secondary | ICD-10-CM

## 2019-07-06 LAB — NOVEL CORONAVIRUS, NAA: SARS-CoV-2, NAA: NOT DETECTED

## 2019-11-01 ENCOUNTER — Ambulatory Visit: Payer: Self-pay

## 2019-11-01 ENCOUNTER — Ambulatory Visit: Payer: Managed Care, Other (non HMO) | Attending: Internal Medicine

## 2019-11-01 ENCOUNTER — Encounter: Payer: Self-pay | Admitting: Physician Assistant

## 2019-11-01 ENCOUNTER — Telehealth (INDEPENDENT_AMBULATORY_CARE_PROVIDER_SITE_OTHER): Payer: Managed Care, Other (non HMO) | Admitting: Physician Assistant

## 2019-11-01 DIAGNOSIS — Z20822 Contact with and (suspected) exposure to covid-19: Secondary | ICD-10-CM

## 2019-11-01 DIAGNOSIS — R5383 Other fatigue: Secondary | ICD-10-CM

## 2019-11-01 DIAGNOSIS — R42 Dizziness and giddiness: Secondary | ICD-10-CM | POA: Diagnosis not present

## 2019-11-01 DIAGNOSIS — R5381 Other malaise: Secondary | ICD-10-CM | POA: Diagnosis not present

## 2019-11-01 MED ORDER — MECLIZINE HCL 25 MG PO TABS: 25.0000 mg | ORAL_TABLET | Freq: Three times a day (TID) | ORAL | 0 refills | Status: DC | PRN

## 2019-11-01 NOTE — Telephone Encounter (Signed)
Pt. Reports she had a COVID 19 exposure last week. Saturday started having dizziness, chills, fatigue. Had a rapid COVID test "and it was negative." Still having symptoms, requests an appointment. Appointment scheduled for this morning.  Reason for Disposition . [1] MODERATE dizziness (e.g., interferes with normal activities) AND [2] has NOT been evaluated by physician for this  (Exception: dizziness caused by heat exposure, sudden standing, or poor fluid intake)  Answer Assessment - Initial Assessment Questions 1. DESCRIPTION: "Describe your dizziness."     Dizziness 2. LIGHTHEADED: "Do you feel lightheaded?" (e.g., somewhat faint, woozy, weak upon standing)     Dizziness 3. VERTIGO: "Do you feel like either you or the room is spinning or tilting?" (i.e. vertigo)     Dizziness 4. SEVERITY: "How bad is it?"  "Do you feel like you are going to faint?" "Can you stand and walk?"   - MILD - walking normally   - MODERATE - interferes with normal activities (e.g., work, school)    - SEVERE - unable to stand, requires support to walk, feels like passing out now.      Moderate 5. ONSET:  "When did the dizziness begin?"     Saturday 6. AGGRAVATING FACTORS: "Does anything make it worse?" (e.g., standing, change in head position)     No 7. HEART RATE: "Can you tell me your heart rate?" "How many beats in 15 seconds?"  (Note: not all patients can do this)       No 8. CAUSE: "What do you think is causing the dizziness?"     Unsure 9. RECURRENT SYMPTOM: "Have you had dizziness before?" If so, ask: "When was the last time?" "What happened that time?"     No 10. OTHER SYMPTOMS: "Do you have any other symptoms?" (e.g., fever, chest pain, vomiting, diarrhea, bleeding)       Chills, fatigue 11. PREGNANCY: "Is there any chance you are pregnant?" "When was your last menstrual period?"       No  Protocols used: DIZZINESS [facility]

## 2019-11-01 NOTE — Progress Notes (Signed)
[name] Female    DOB: 04/11/1981   39 y.o.   MRN: [id number] Visit Date: 11/01/2019  Today's Provider: Mar Daring, PA-C   Chief Complaint  Patient presents with  . Dizziness   Subjective:     Virtual Visit via Video Note  I connected with Oretha Caprice on 11/01/19 at 10:40 AM EDT by a video enabled telemedicine application and verified that I am speaking with the correct person using two identifiers.  Location: Patient: Home Provider: [facility]   I discussed the limitations of evaluation and management by telemedicine and the availability of in person appointments. The patient expressed understanding and agreed to proceed.  HPI  Pt. Reports she had a COVID 19 exposure last week, possibly even was exposed the end of the week before. Saturday started having dizziness, chills, fatigue. Had a rapid COVID test "and it was negative." She had PCR covid test done today. Reports some changes in her smell or taste, but not a complete loss. Does mention she could not taste her coffee this morning. Fatigue is her main symptom. Does have intermittent palpitation feelings associated with some mild SOB sensations. This has been very intermittent and not persistent.   Allergies  Allergen Reactions  . Bactrim Other (See Comments)    Dizziness   . Sulfa Antibiotics Other (See Comments)    dizziness     Current Outpatient Medications:  .  ibuprofen (ADVIL,MOTRIN) 800 MG tablet, Take 1 tablet (800 mg total) by mouth 3 (three) times daily with meals as needed for headache or moderate pain. (Patient not taking: Reported on 05/31/2019), Disp: 30 tablet, Rfl: 3 .  loratadine (CLARITIN REDITABS) 10 MG dissolvable tablet, Take 1 tablet (10 mg total) by mouth daily., Disp: 90 tablet, Rfl: 0 .  MYRBETRIQ 25 MG TB24 tablet, , Disp: , Rfl:   Review of Systems  Constitutional: Positive for appetite change and fatigue. Negative for fever.  HENT: Negative for congestion, ear  pain, hearing loss, postnasal drip, rhinorrhea, sinus pressure, sinus pain, sore throat and tinnitus.   Respiratory: Positive for chest tightness and shortness of breath. Negative for cough.   Cardiovascular: Positive for palpitations. Negative for chest pain and leg swelling.  Gastrointestinal: Negative for abdominal pain and nausea.  Neurological: Positive for dizziness. Negative for light-headedness and headaches.    Social History   Tobacco Use  . Smoking status: Former Smoker    Packs/day: 0.50    Years: 6.00    Pack years: 3.00    Types: Cigarettes    Quit date: 08/19/2004    Years since quitting: 15.2  . Smokeless tobacco: Never Used  . Tobacco comment: quit in 2004  Substance Use Topics  . Alcohol use: Yes    Alcohol/week: 0.0 standard drinks    Comment: OCCASIONALLY      Objective:   There were no vitals taken for this visit. There were no vitals filed for this visit.There is no height or weight on file to calculate BMI.   Physical Exam Vitals reviewed.  Constitutional:      General: She is not in acute distress.    Appearance: Normal appearance. She is well-developed. She is ill-appearing.  HENT:     Head: Normocephalic and atraumatic.  Pulmonary:     Effort: Pulmonary effort is normal. No respiratory distress (able to talk in full sentences).  Musculoskeletal:     Cervical back: Normal range of motion and neck supple.  Neurological:  Mental Status: She is alert.  Psychiatric:        Mood and Affect: Mood normal.        Behavior: Behavior normal.        Thought Content: Thought content normal.        Judgment: Judgment normal.      No results found for any visits on 11/01/19.     Assessment & Plan     1. Dizziness Continue isolation guidelines for 10 days from symptom onset. Will treat symptomatically as high likelihood of Covid-19 infection or other viral infection. Meclizine for dizziness as needed. Sent medications that can help OTC through  mychart. Mychart covid 19 symptom monitoring ordered. Work note provided. Advised to also get a pulse oximeter to measure O2 levels. Call if worsening or symptoms change.  - meclizine (ANTIVERT) 25 MG tablet; Take 1 tablet (25 mg total) by mouth 3 (three) times daily as needed for dizziness.  Dispense: 30 tablet; Refill: 0 - MyChart COVID-19 home monitoring program; Future - Temperature monitoring; Future  2. Malaise and fatigue See above medical treatment plan. - MyChart COVID-19 home monitoring program; Future - Temperature monitoring; Future  3. Close exposure to COVID-19 virus See above medical treatment plan. - MyChart COVID-19 home monitoring program; Future - Temperature monitoring; Future   I discussed the assessment and treatment plan with the patient. The patient was provided an opportunity to ask questions and all were answered. The patient agreed with the plan and demonstrated an understanding of the instructions.   The patient was advised to call back or seek an in-person evaluation if the symptoms worsen or if the condition fails to improve as anticipated.  I provided 10 minutes of non-face-to-face time during this encounter.    Margaretann Loveless, PA-C  [facility]

## 2019-11-01 NOTE — Patient Instructions (Signed)
Can take to lessen severity: Vit C 500mg twice daily Quercertin 250-500mg twice daily Zinc 75-100mg daily Melatonin 3-6 mg at bedtime Vit D3 1000-2000 IU daily Aspirin 81 mg daily with food Optional: Famotidine 20mg daily Also can add tylenol/ibuprofen as needed for fevers and body aches May add Mucinex or Mucinex DM as needed for cough/congestion  COVID-19 COVID-19 is a respiratory infection that is caused by a virus called severe acute respiratory syndrome coronavirus 2 (SARS-CoV-2). The disease is also known as coronavirus disease or novel coronavirus. In some people, the virus may not cause any symptoms. In others, it may cause a serious infection. The infection can get worse quickly and can lead to complications, such as:  Pneumonia, or infection of the lungs.  Acute respiratory distress syndrome or ARDS. This is a condition in which fluid build-up in the lungs prevents the lungs from filling with air and passing oxygen into the blood.  Acute respiratory failure. This is a condition in which there is not enough oxygen passing from the lungs to the body or when carbon dioxide is not passing from the lungs out of the body.  Sepsis or septic shock. This is a serious bodily reaction to an infection.  Blood clotting problems.  Secondary infections due to bacteria or fungus.  Organ failure. This is when your body's organs stop working. The virus that causes COVID-19 is contagious. This means that it can spread from person to person through droplets from coughs and sneezes (respiratory secretions). What are the causes? This illness is caused by a virus. You may catch the virus by:  Breathing in droplets from an infected person. Droplets can be spread by a person breathing, speaking, singing, coughing, or sneezing.  Touching something, like a table or a doorknob, that was exposed to the virus (contaminated) and then touching your mouth, nose, or eyes. What increases the risk? Risk for  infection You are more likely to be infected with this virus if you:  Are within 6 feet (2 meters) of a person with COVID-19.  Provide care for or live with a person who is infected with COVID-19.  Spend time in crowded indoor spaces or live in shared housing. Risk for serious illness You are more likely to become seriously ill from the virus if you:  Are 50 years of age or older. The higher your age, the more you are at risk for serious illness.  Live in a nursing home or long-term care facility.  Have cancer.  Have a long-term (chronic) disease such as: ? Chronic lung disease, including chronic obstructive pulmonary disease or asthma. ? A long-term disease that lowers your body's ability to fight infection (immunocompromised). ? Heart disease, including heart failure, a condition in which the arteries that lead to the heart become narrow or blocked (coronary artery disease), a disease which makes the heart muscle thick, weak, or stiff (cardiomyopathy). ? Diabetes. ? Chronic kidney disease. ? Sickle cell disease, a condition in which red blood cells have an abnormal "sickle" shape. ? Liver disease.  Are obese. What are the signs or symptoms? Symptoms of this condition can range from mild to severe. Symptoms may appear any time from 2 to 14 days after being exposed to the virus. They include:  A fever or chills.  A cough.  Difficulty breathing.  Headaches, body aches, or muscle aches.  Runny or stuffy (congested) nose.  A sore throat.  New loss of taste or smell. Some people may also have stomach problems,   such as nausea, vomiting, or diarrhea. Other people may not have any symptoms of COVID-19. How is this diagnosed? This condition may be diagnosed based on:  Your signs and symptoms, especially if: ? You live in an area with a COVID-19 outbreak. ? You recently traveled to or from an area where the virus is common. ? You provide care for or live with a person who  was diagnosed with COVID-19. ? You were exposed to a person who was diagnosed with COVID-19.  A physical exam.  Lab tests, which may include: ? Taking a sample of fluid from the back of your nose and throat (nasopharyngeal fluid), your nose, or your throat using a swab. ? A sample of mucus from your lungs (sputum). ? Blood tests.  Imaging tests, which may include, X-rays, CT scan, or ultrasound. How is this treated? At present, there is no medicine to treat COVID-19. Medicines that treat other diseases are being used on a trial basis to see if they are effective against COVID-19. Your health care provider will talk with you about ways to treat your symptoms. For most people, the infection is mild and can be managed at home with rest, fluids, and over-the-counter medicines. Treatment for a serious infection usually takes places in a hospital intensive care unit (ICU). It may include one or more of the following treatments. These treatments are given until your symptoms improve.  Receiving fluids and medicines through an IV.  Supplemental oxygen. Extra oxygen is given through a tube in the nose, a face mask, or a hood.  Positioning you to lie on your stomach (prone position). This makes it easier for oxygen to get into the lungs.  Continuous positive airway pressure (CPAP) or bi-level positive airway pressure (BPAP) machine. This treatment uses mild air pressure to keep the airways open. A tube that is connected to a motor delivers oxygen to the body.  Ventilator. This treatment moves air into and out of the lungs by using a tube that is placed in your windpipe.  Tracheostomy. This is a procedure to create a hole in the neck so that a breathing tube can be inserted.  Extracorporeal membrane oxygenation (ECMO). This procedure gives the lungs a chance to recover by taking over the functions of the heart and lungs. It supplies oxygen to the body and removes carbon dioxide. Follow these  instructions at home: Lifestyle  If you are sick, stay home except to get medical care. Your health care provider will tell you how long to stay home. Call your health care provider before you go for medical care.  Rest at home as told by your health care provider.  Do not use any products that contain nicotine or tobacco, such as cigarettes, e-cigarettes, and chewing tobacco. If you need help quitting, ask your health care provider.  Return to your normal activities as told by your health care provider. Ask your health care provider what activities are safe for you. General instructions  Take over-the-counter and prescription medicines only as told by your health care provider.  Drink enough fluid to keep your urine pale yellow.  Keep all follow-up visits as told by your health care provider. This is important. How is this prevented?  There is no vaccine to help prevent COVID-19 infection. However, there are steps you can take to protect yourself and others from this virus. To protect yourself:   Do not travel to areas where COVID-19 is a risk. The areas where COVID-19 is reported change   often. To identify high-risk areas and travel restrictions, check the CDC travel website: wwwnc.cdc.gov/travel/notices  If you live in, or must travel to, an area where COVID-19 is a risk, take precautions to avoid infection. ? Stay away from people who are sick. ? Wash your hands often with soap and water for 20 seconds. If soap and water are not available, use an alcohol-based hand sanitizer. ? Avoid touching your mouth, face, eyes, or nose. ? Avoid going out in public, follow guidance from your state and local health authorities. ? If you must go out in public, wear a cloth face covering or face mask. Make sure your mask covers your nose and mouth. ? Avoid crowded indoor spaces. Stay at least 6 feet (2 meters) away from others. ? Disinfect objects and surfaces that are frequently touched every day.  This may include:  Counters and tables.  Doorknobs and light switches.  Sinks and faucets.  Electronics, such as phones, remote controls, keyboards, computers, and tablets. To protect others: If you have symptoms of COVID-19, take steps to prevent the virus from spreading to others.  If you think you have a COVID-19 infection, contact your health care provider right away. Tell your health care team that you think you may have a COVID-19 infection.  Stay home. Leave your house only to seek medical care. Do not use public transport.  Do not travel while you are sick.  Wash your hands often with soap and water for 20 seconds. If soap and water are not available, use alcohol-based hand sanitizer.  Stay away from other members of your household. Let healthy household members care for children and pets, if possible. If you have to care for children or pets, wash your hands often and wear a mask. If possible, stay in your own room, separate from others. Use a different bathroom.  Make sure that all people in your household wash their hands well and often.  Cough or sneeze into a tissue or your sleeve or elbow. Do not cough or sneeze into your hand or into the air.  Wear a cloth face covering or face mask. Make sure your mask covers your nose and mouth. Where to find more information  Centers for Disease Control and Prevention: www.cdc.gov/coronavirus/2019-ncov/index.html  World Health Organization: www.who.int/health-topics/coronavirus Contact a health care provider if:  You live in or have traveled to an area where COVID-19 is a risk and you have symptoms of the infection.  You have had contact with someone who has COVID-19 and you have symptoms of the infection. Get help right away if:  You have trouble breathing.  You have pain or pressure in your chest.  You have confusion.  You have bluish lips and fingernails.  You have difficulty waking from sleep.  You have symptoms  that get worse. These symptoms may represent a serious problem that is an emergency. Do not wait to see if the symptoms will go away. Get medical help right away. Call your local emergency services (911 in the [place] Do not drive yourself to the hospital. Let the emergency medical personnel know if you think you have COVID-19. Summary  COVID-19 is a respiratory infection that is caused by a virus. It is also known as coronavirus disease or novel coronavirus. It can cause serious infections, such as pneumonia, acute respiratory distress syndrome, acute respiratory failure, or sepsis.  The virus that causes COVID-19 is contagious. This means that it can spread from person to person through droplets from breathing, speaking,   singing, coughing, or sneezing.  You are more likely to develop a serious illness if you are 50 years of age or older, have a weak immune system, live in a nursing home, or have chronic disease.  There is no medicine to treat COVID-19. Your health care provider will talk with you about ways to treat your symptoms.  Take steps to protect yourself and others from infection. Wash your hands often and disinfect objects and surfaces that are frequently touched every day. Stay away from people who are sick and wear a mask if you are sick. This information is not intended to replace advice given to you by your health care provider. Make sure you discuss any questions you have with your health care provider. Document Revised: 06/04/2019 Document Reviewed: 09/10/2018 Elsevier Patient Education  2020 Elsevier Inc.  

## 2019-11-02 LAB — NOVEL CORONAVIRUS, NAA: SARS-CoV-2, NAA: NOT DETECTED

## 2019-11-03 ENCOUNTER — Telehealth: Payer: Self-pay

## 2019-11-03 DIAGNOSIS — R5382 Chronic fatigue, unspecified: Secondary | ICD-10-CM

## 2019-11-03 DIAGNOSIS — E559 Vitamin D deficiency, unspecified: Secondary | ICD-10-CM

## 2019-11-03 DIAGNOSIS — R42 Dizziness and giddiness: Secondary | ICD-10-CM

## 2019-11-03 NOTE — Addendum Note (Signed)
Addended by: Margaretann Loveless on: 11/03/2019 02:30 PM   Modules accepted: Orders

## 2019-11-03 NOTE — Telephone Encounter (Signed)
Labs have been ordered

## 2019-11-03 NOTE — Telephone Encounter (Signed)
Copied from CRM [id number]. Topic: Appointment Scheduling - Scheduling Inquiry for Clinic >> Nov 03, 2019 11:04 AM Victoria [name] wrote: Reason for CRM:   Pt calling.  States that she got her COVID results back yesterday that are negative.  Pt still doesn't feel well and would like to know what labs PCP was wanting to do and can she now come in for those. Pt can be reached at [phone].

## 2019-11-03 NOTE — Telephone Encounter (Signed)
Pt advised.   Thanks,   -Edgerrin Correia  

## 2019-11-05 LAB — CBC WITH DIFFERENTIAL/PLATELET
Basophils Absolute: 0 10*3/uL (ref 0.0–0.2)
Basos: 0 %
EOS (ABSOLUTE): 0 10*3/uL (ref 0.0–0.4)
Eos: 1 %
Hematocrit: 38.1 % (ref 34.0–46.6)
Hemoglobin: 12.8 g/dL (ref 11.1–15.9)
Immature Grans (Abs): 0 10*3/uL (ref 0.0–0.1)
Immature Granulocytes: 0 %
Lymphocytes Absolute: 1.5 10*3/uL (ref 0.7–3.1)
Lymphs: 24 %
MCH: 31.1 pg (ref 26.6–33.0)
MCHC: 33.6 g/dL (ref 31.5–35.7)
MCV: 93 fL (ref 79–97)
Monocytes Absolute: 0.4 10*3/uL (ref 0.1–0.9)
Monocytes: 6 %
Neutrophils Absolute: 4.4 10*3/uL (ref 1.4–7.0)
Neutrophils: 69 %
Platelets: 265 10*3/uL (ref 150–450)
RBC: 4.11 x10E6/uL (ref 3.77–5.28)
RDW: 12.9 % (ref 11.7–15.4)
WBC: 6.4 10*3/uL (ref 3.4–10.8)

## 2019-11-05 LAB — COMPREHENSIVE METABOLIC PANEL
ALT: 10 IU/L (ref 0–32)
AST: 14 IU/L (ref 0–40)
Albumin/Globulin Ratio: 1.7 (ref 1.2–2.2)
Albumin: 4 g/dL (ref 3.8–4.8)
Alkaline Phosphatase: 56 IU/L (ref 39–117)
BUN/Creatinine Ratio: 16 (ref 9–23)
BUN: 12 mg/dL (ref 6–20)
Bilirubin Total: 0.4 mg/dL (ref 0.0–1.2)
CO2: 20 mmol/L (ref 20–29)
Calcium: 9 mg/dL (ref 8.7–10.2)
Chloride: 105 mmol/L (ref 96–106)
Creatinine, Ser: 0.76 mg/dL (ref 0.57–1.00)
GFR calc Af Amer: 114 mL/min/{1.73_m2} (ref >59)
GFR calc non Af Amer: 99 mL/min/{1.73_m2} (ref >59)
Globulin, Total: 2.4 g/dL (ref 1.5–4.5)
Glucose: 90 mg/dL (ref 65–99)
Potassium: 4.2 mmol/L (ref 3.5–5.2)
Sodium: 139 mmol/L (ref 134–144)
Total Protein: 6.4 g/dL (ref 6.0–8.5)

## 2019-11-05 LAB — T4 AND TSH
T4, Total: 8.1 ug/dL (ref 4.5–12.0)
TSH: 1.69 u[IU]/mL (ref 0.450–4.500)

## 2019-11-05 LAB — IRON,TIBC AND FERRITIN PANEL
Ferritin: 25 ng/mL (ref 15–150)
Iron Saturation: 23 % (ref 15–55)
Iron: 75 ug/dL (ref 27–159)
Total Iron Binding Capacity: 325 ug/dL (ref 250–450)
UIBC: 250 ug/dL (ref 131–425)

## 2019-11-05 LAB — VITAMIN D 25 HYDROXY (VIT D DEFICIENCY, FRACTURES): Vit D, 25-Hydroxy: 24.7 ng/mL — ABNORMAL LOW (ref 30.0–100.0)

## 2019-11-05 LAB — B12 AND FOLATE PANEL
Folate: 2.9 ng/mL — ABNORMAL LOW (ref >3.0)
Vitamin B-12: 324 pg/mL (ref 232–1245)

## 2019-12-21 ENCOUNTER — Ambulatory Visit (INDEPENDENT_AMBULATORY_CARE_PROVIDER_SITE_OTHER): Payer: Managed Care, Other (non HMO)

## 2019-12-21 ENCOUNTER — Other Ambulatory Visit: Payer: Self-pay

## 2019-12-21 ENCOUNTER — Ambulatory Visit (INDEPENDENT_AMBULATORY_CARE_PROVIDER_SITE_OTHER): Payer: Managed Care, Other (non HMO) | Admitting: Physician Assistant

## 2019-12-21 VITALS — BP 136/78 | HR 82

## 2019-12-21 DIAGNOSIS — F4322 Adjustment disorder with anxiety: Secondary | ICD-10-CM

## 2019-12-21 DIAGNOSIS — R300 Dysuria: Secondary | ICD-10-CM | POA: Diagnosis not present

## 2019-12-21 LAB — POCT URINE QUALITATIVE DIPSTICK BLOOD: Blood, UA: POSITIVE

## 2019-12-21 MED ORDER — HYDROXYZINE HCL 10 MG PO TABS: 10.0000 mg | ORAL_TABLET | Freq: Three times a day (TID) | ORAL | 0 refills | Status: DC | PRN

## 2019-12-21 NOTE — Progress Notes (Signed)
SUBJECTIVE: Victoria [name] is a 39 y.o. female who complains of urinary frequency, urgency and dysuria x  3 days, without flank pain, fever, chills, or abnormal vaginal discharge or bleeding.   OBJECTIVE: Appears well, in no apparent distress.  Vital signs are normal. Urine dipstick shows small amount of blood other wise negative for leuk.  ASSESSMENT: Dysuria  PLAN: Treatment per orders.  Call or return to clinic prn if these symptoms worsen or fail to improve as anticipated.

## 2019-12-21 NOTE — Progress Notes (Signed)
Virtual telephone visit    Virtual Visit via Telephone Note   This visit type was conducted due to national recommendations for restrictions regarding the COVID-19 Pandemic (e.g. social distancing) in an effort to limit this patient's exposure and mitigate transmission in our community. Due to her co-morbid illnesses, this patient is at least at moderate risk for complications without adequate follow up. This format is felt to be most appropriate for this patient at this time. The patient did not have access to video technology or had technical difficulties with video requiring transitioning to audio format only (telephone). Physical exam was limited to content and character of the telephone converstion.    Patient location: Home Provider location: Office    Visit Date: 12/21/2019  Today's healthcare provider: Trinna Post, PA-C   Chief Complaint  Patient presents with  . Dysuria  . Anxiety   Subjective    HPI Patient presents today virtually for anxiety due to finding out last night that her husband was cheating on her. Patient states that she can barely function and could not sleep last night. She continuously crying and states she is very hurt. They have a child together and was about to go on a trip to celebrate their 1 year wedding anniversary in 2 weeks. Patient has been talking to a counselor previously and plans on talking them again.   Dysuria  Patient was having dysuria and was able to have sample collected at [facility] which is being sent off for culture. Denies fevers, chills, nausea, vomiting, back pain.    Medications: Outpatient Medications Prior to Visit  Medication Sig  . meclizine (ANTIVERT) 25 MG tablet Take 1 tablet (25 mg total) by mouth 3 (three) times daily as needed for dizziness.  Marland Kitchen MYRBETRIQ 25 MG TB24 tablet   . ibuprofen (ADVIL,MOTRIN) 800 MG tablet Take 1 tablet (800 mg total) by mouth 3 (three) times daily with meals as needed for headache or  moderate pain. (Patient not taking: Reported on 05/31/2019)  . loratadine (CLARITIN REDITABS) 10 MG dissolvable tablet Take 1 tablet (10 mg total) by mouth daily.   No facility-administered medications prior to visit.    Review of Systems  Constitutional: Negative.   Respiratory: Negative.   Cardiovascular: Negative.   Psychiatric/Behavioral: Positive for decreased concentration and sleep disturbance. Negative for confusion, self-injury and suicidal ideas. The patient is nervous/anxious.       Objective    There were no vitals taken for this visit.     Assessment & Plan    1. Acute adjustment disorder with anxiety  Counseled about adjustment reaction with anxiety. Anxiety state may last longer than 1-2 weeks and she would likely be helped by SSRI, at least for a period of time. She intends on seeking counseling. She would like to start with PRN anxiety medicine now. Treat as below. She can take 10-20 mg TID PRN. Follow up PRN.   - hydrOXYzine (ATARAX/VISTARIL) 10 MG tablet; Take 1 tablet (10 mg total) by mouth 3 (three) times daily as needed.  Dispense: 30 tablet; Refill: 0    Follow up PRN    I discussed the assessment and treatment plan with the patient. The patient was provided an opportunity to ask questions and all were answered. The patient agreed with the plan and demonstrated an understanding of the instructions.   The patient was advised to call back or seek an in-person evaluation if the symptoms worsen or if the condition fails to improve as  anticipated.  I provided 25 minutes of non-face-to-face time during this encounter.  ITrey Sailors, PA-C, have reviewed all documentation for this visit. The documentation on 12/21/19 for the exam, diagnosis, procedures, and orders are all accurate and complete.   Maryella Shivers [facility] [phone] (phone) [phone] (fax)  [facility]

## 2019-12-23 LAB — URINE CULTURE, OB REFLEX

## 2019-12-23 LAB — CULTURE, OB URINE

## 2019-12-29 NOTE — Progress Notes (Signed)
Patient was assessed and managed by nursing staff during this encounter. I have reviewed the chart and agree with the documentation and plan. I have also made any necessary editorial changes.  Jaynie Collins, MD 12/29/2019 8:22 AM

## 2020-01-03 ENCOUNTER — Telehealth (INDEPENDENT_AMBULATORY_CARE_PROVIDER_SITE_OTHER): Payer: Managed Care, Other (non HMO) | Admitting: Physician Assistant

## 2020-01-03 DIAGNOSIS — F4322 Adjustment disorder with anxiety: Secondary | ICD-10-CM

## 2020-01-03 MED ORDER — ESCITALOPRAM OXALATE 10 MG PO TABS: 10.0000 mg | ORAL_TABLET | Freq: Every day | ORAL | 0 refills | Status: DC

## 2020-01-03 MED ORDER — HYDROXYZINE HCL 25 MG PO TABS: 25.0000 mg | ORAL_TABLET | Freq: Three times a day (TID) | ORAL | 1 refills | Status: DC | PRN

## 2020-01-03 NOTE — Progress Notes (Signed)
Virtual telephone visit    Virtual Visit via Telephone Note   This visit type was conducted due to national recommendations for restrictions regarding the COVID-19 Pandemic (e.g. social distancing) in an effort to limit this patient's exposure and mitigate transmission in our community. Due to her co-morbid illnesses, this patient is at least at moderate risk for complications without adequate follow up. This format is felt to be most appropriate for this patient at this time. The patient did not have access to video technology or had technical difficulties with video requiring transitioning to audio format only (telephone). Physical exam was limited to content and character of the telephone converstion.    Patient location: Home Provider location: Office   Visit Date: 01/03/2020  Today's healthcare provider: Trey Sailors, PA-C   Chief Complaint  Patient presents with  . Anxiety   Subjective    HPI Anxiety, Follow-up  She was last seen for anxiety 14 days ago. Changes made at last visit include patient was started on Hydroxyzine 10 MG 3 times daily as needed.    She reports good compliance with treatment. She reports fair to poor tolerance of treatment. She is not having side effects.   She feels her anxiety is severe and Unchanged since last visit. Exacerbating factors include husband's recent infidelity. Unable to contact social worker due to waiting period. 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   Symptoms: No chest pain Yes difficulty concentrating  No dizziness No fatigue  No feelings of losing control No insomnia  No irritable No palpitations  Yes panic attacks Yes racing thoughts  No shortness of breath Yes sweating  Yes tremors/shakes    GAD-7 Results No flowsheet data found.  PHQ-9 Scores PHQ9 SCORE ONLY 01/25/2019 04/23/2017  PHQ-9 Total Score 0 0     ---------------------------------------------------------------------------------------------------       Medications: Outpatient Medications Prior to Visit  Medication Sig  . hydrOXYzine (ATARAX/VISTARIL) 10 MG tablet Take 1 tablet (10 mg total) by mouth 3 (three) times daily as needed.  . meclizine (ANTIVERT) 25 MG tablet Take 1 tablet (25 mg total) by mouth 3 (three) times daily as needed for dizziness.  Marland Kitchen ibuprofen (ADVIL,MOTRIN) 800 MG tablet Take 1 tablet (800 mg total) by mouth 3 (three) times daily with meals as needed for headache or moderate pain. (Patient not taking: Reported on 05/31/2019)  . loratadine (CLARITIN REDITABS) 10 MG dissolvable tablet Take 1 tablet (10 mg total) by mouth daily.  Marland Kitchen MYRBETRIQ 25 MG TB24 tablet    No facility-administered medications prior to visit.    Review of Systems  Psychiatric/Behavioral: Positive for decreased concentration and sleep disturbance. The patient is nervous/anxious.       Objective    There were no vitals taken for this visit.     Assessment & Plan    1. Adjustment disorder with anxiety  Will Start Lexapro 10 mg daily Discussed potential side effects, incl GI upset, sexual dysfunction, increased anxiety, and SI Discussed that it can take 6-8 weeks to reach full efficacy Contracted for safety - no SI/HI Discussed synergistic effects of medications and therapy She has a CPE with PCP on 02/17/2020 and is agreeable to following up for anxiety then. Will also increase hydroxyzine as below.  - escitalopram (LEXAPRO) 10 MG tablet; Take 1 tablet (10 mg total) by mouth daily.  Dispense: 90 tablet; Refill: 0 - hydrOXYzine (ATARAX/VISTARIL) 25 MG tablet; Take 1 tablet (25 mg total) by mouth 3 (three) times daily  as needed.  Dispense: 30 tablet; Refill: 1    I discussed the assessment and treatment plan with the patient. The patient was provided an opportunity to ask questions and all were answered. The patient agreed with the  plan and demonstrated an understanding of the instructions.   The patient was advised to call back or seek an in-person evaluation if the symptoms worsen or if the condition fails to improve as anticipated.  I provided 25 minutes of non-face-to-face time during this encounter.  ITrinna Post, PA-C, have reviewed all documentation for this visit. The documentation on 01/03/20 for the exam, diagnosis, procedures, and orders are all accurate and complete.   Paulene Floor [facility] [phone] (phone) [phone] (fax)  [place]

## 2020-01-03 NOTE — Patient Instructions (Signed)

## 2020-02-15 NOTE — Progress Notes (Signed)
Complete physical exam   [name]   DOB: 1980/11/13   39 y.o. Female  MRN: [id number] Visit Date: 02/17/2020  Today's healthcare provider: Margaretann Loveless, PA-C   Chief Complaint  Patient presents with  . Annual Exam   Subjective     Victoria [name] is a 39 y.o. female who presents today for a complete physical exam.  She reports consuming a general diet. The patient does not participate in regular exercise at present. She generally feels well. She reports sleeping fairly well. She does not have additional problems to discuss today.  HPI  Pap: 05/31/19-Normal-Repeat in 3 years-followed by GYN  Past Medical History:  Diagnosis Date  . Abnormal Pap smear of cervix   . Calculus of kidney    left distal ureteral stone  . Dyspareunia    Past Surgical History:  Procedure Laterality Date  . CRYOTHERAPY     AGE 48  . LITHOTRIPSY    . TUBAL LIGATION     Social History   Socioeconomic History  . Marital status: Married    Spouse name: Not on file  . Number of children: 2  . Years of education: Not on file  . Highest education level: Not on file  Occupational History  . Not on file  Tobacco Use  . Smoking status: Former Smoker    Packs/day: 0.50    Years: 6.00    Pack years: 3.00    Types: Cigarettes    Quit date: 08/19/2004    Years since quitting: 15.5  . Smokeless tobacco: Never Used  . Tobacco comment: quit in 2004  Vaping Use  . Vaping Use: Never used  Substance and Sexual Activity  . Alcohol use: Yes    Alcohol/week: 0.0 standard drinks    Comment: OCCASIONALLY  . Drug use: No  . Sexual activity: Yes    Partners: Male    Birth control/protection: Surgical    Comment: tubaligation.  Other Topics Concern  . Not on file  Social History Narrative  . Not on file   Social Determinants of Health   Financial Resource Strain:   . Difficulty of Paying Living Expenses:   Food Insecurity:   . Worried About Programme researcher, broadcasting/film/video in the Last  Year:   . Barista in the Last Year:   Transportation Needs:   . Freight forwarder (Medical):   Marland Kitchen Lack of Transportation (Non-Medical):   Physical Activity:   . Days of Exercise per Week:   . Minutes of Exercise per Session:   Stress:   . Feeling of Stress :   Social Connections:   . Frequency of Communication with Friends and Family:   . Frequency of Social Gatherings with Friends and Family:   . Attends Religious Services:   . Active Member of Clubs or Organizations:   . Attends Banker Meetings:   Marland Kitchen Marital Status:   Intimate Partner Violence:   . Fear of Current or Ex-Partner:   . Emotionally Abused:   Marland Kitchen Physically Abused:   . Sexually Abused:    Family Status  Relation Name Status  . Mat Aunt  Deceased  . PGF  Deceased  . PGM  Deceased  . Brother  Alive  . Mother  Alive  . Father  Deceased  . Brother  Alive  . Daughter  Alive  . Son  Alive   Family History  Problem Relation Age of Onset  .  Breast cancer Maternal Aunt   . Cancer Maternal Aunt        breast  . Heart attack Paternal Grandfather   . Heart disease Paternal Grandfather        heart atack  . Heart attack Paternal Grandmother   . Heart disease Paternal Grandmother        heart attack  . Hypertension Brother   . Alcohol abuse Brother   . Pancreatitis Brother   . Alcohol abuse Father    Allergies  Allergen Reactions  . Bactrim Other (See Comments)    Dizziness   . Sulfa Antibiotics Other (See Comments)    dizziness    Patient Care Team: Margaretann LovelessBurnette, Jakeria Caissie M, PA-C as PCP - General (Family Medicine)   Medications: Outpatient Medications Prior to Visit  Medication Sig  . hydrOXYzine (ATARAX/VISTARIL) 25 MG tablet Take 1 tablet (25 mg total) by mouth 3 (three) times daily as needed.  Marland Kitchen. MYRBETRIQ 25 MG TB24 tablet   . [DISCONTINUED] escitalopram (LEXAPRO) 10 MG tablet Take 1 tablet (10 mg total) by mouth daily. (Patient not taking: Reported on 02/17/2020)  .  [DISCONTINUED] ibuprofen (ADVIL,MOTRIN) 800 MG tablet Take 1 tablet (800 mg total) by mouth 3 (three) times daily with meals as needed for headache or moderate pain. (Patient not taking: Reported on 05/31/2019)  . [DISCONTINUED] loratadine (CLARITIN REDITABS) 10 MG dissolvable tablet Take 1 tablet (10 mg total) by mouth daily.  . [DISCONTINUED] meclizine (ANTIVERT) 25 MG tablet Take 1 tablet (25 mg total) by mouth 3 (three) times daily as needed for dizziness. (Patient not taking: Reported on 02/17/2020)   No facility-administered medications prior to visit.    Review of Systems  Constitutional: Negative.   HENT: Negative.   Eyes: Negative.   Respiratory: Negative.   Cardiovascular: Negative.   Gastrointestinal: Negative.   Endocrine: Negative.   Genitourinary: Negative.   Musculoskeletal: Negative.   Skin: Negative.   Allergic/Immunologic: Negative.   Neurological: Negative.   Hematological: Negative.   Psychiatric/Behavioral: Negative.     Last CBC Lab Results  Component Value Date   WBC 6.4 11/04/2019   HGB 12.8 11/04/2019   HCT 38.1 11/04/2019   MCV 93 11/04/2019   MCH 31.1 11/04/2019   RDW 12.9 11/04/2019   PLT 265 11/04/2019   Last metabolic panel Lab Results  Component Value Date   GLUCOSE 90 11/04/2019   NA 139 11/04/2019   K 4.2 11/04/2019   CL 105 11/04/2019   CO2 20 11/04/2019   BUN 12 11/04/2019   CREATININE 0.76 11/04/2019   GFRNONAA 99 11/04/2019   GFRAA 114 11/04/2019   CALCIUM 9.0 11/04/2019   PROT 6.4 11/04/2019   ALBUMIN 4.0 11/04/2019   LABGLOB 2.4 11/04/2019   AGRATIO 1.7 11/04/2019   BILITOT 0.4 11/04/2019   ALKPHOS 56 11/04/2019   AST 14 11/04/2019   ALT 10 11/04/2019   Last lipids Lab Results  Component Value Date   CHOL 202 (H) 02/22/2019   HDL 53 02/22/2019   LDLCALC 136 (H) 02/22/2019   TRIG 67 02/22/2019   CHOLHDL 3.8 02/22/2019   Last hemoglobin A1c Lab Results  Component Value Date   HGBA1C 5.6 02/22/2019   Last thyroid  functions Lab Results  Component Value Date   TSH 1.690 11/04/2019   T4TOTAL 8.1 11/04/2019   Last vitamin D Lab Results  Component Value Date   VD25OH 24.7 (L) 11/04/2019   Last vitamin B12 and Folate Lab Results  Component Value Date  BSJGGEZM62 324 11/04/2019   FOLATE 2.9 (L) 11/04/2019      Objective    BP 118/77 (BP Location: Right Arm, Patient Position: Sitting, Cuff Size: Normal)   Pulse 93   Temp (!) 97.5 F (36.4 C) (Temporal)   Resp 16   Ht 5' 6.5" (1.689 m)   Wt 139 lb (63 kg)   LMP 02/14/2020 (Exact Date)   BMI 22.10 kg/m  BP Readings from Last 3 Encounters:  02/17/20 118/77  12/21/19 136/78  05/31/19 106/82   Wt Readings from Last 3 Encounters:  02/17/20 139 lb (63 kg)  05/31/19 158 lb 9.6 oz (71.9 kg)  01/25/19 157 lb (71.2 kg)      Physical Exam Vitals reviewed.  Constitutional:      General: She is not in acute distress.    Appearance: Normal appearance. She is well-developed and normal weight. She is not ill-appearing or diaphoretic.  HENT:     Head: Normocephalic and atraumatic.     Right Ear: Tympanic membrane, ear canal and external ear normal.     Left Ear: Tympanic membrane, ear canal and external ear normal.     Nose: Nose normal.     Mouth/Throat:     Mouth: Mucous membranes are moist.     Pharynx: Oropharynx is clear. No oropharyngeal exudate.  Eyes:     General: No scleral icterus.       Right eye: No discharge.        Left eye: No discharge.     Extraocular Movements: Extraocular movements intact.     Conjunctiva/sclera: Conjunctivae normal.     Pupils: Pupils are equal, round, and reactive to light.  Neck:     Thyroid: No thyromegaly.     Vascular: No JVD.     Trachea: No tracheal deviation.  Cardiovascular:     Rate and Rhythm: Normal rate and regular rhythm.     Pulses: Normal pulses.     Heart sounds: Normal heart sounds. No murmur heard.  No friction rub. No gallop.   Pulmonary:     Effort: Pulmonary effort is  normal. No respiratory distress.     Breath sounds: Normal breath sounds. No wheezing or rales.  Chest:     Chest wall: No tenderness.  Abdominal:     General: Abdomen is flat. Bowel sounds are normal. There is no distension.     Palpations: Abdomen is soft. There is no mass.     Tenderness: There is no abdominal tenderness. There is no guarding or rebound.  Musculoskeletal:        General: No tenderness. Normal range of motion.     Cervical back: Normal range of motion and neck supple.  Lymphadenopathy:     Cervical: No cervical adenopathy.  Skin:    General: Skin is warm and dry.     Capillary Refill: Capillary refill takes less than 2 seconds.     Findings: No rash.  Neurological:     General: No focal deficit present.     Mental Status: She is alert and oriented to person, place, and time. Mental status is at baseline.  Psychiatric:        Mood and Affect: Mood normal.        Behavior: Behavior normal.        Thought Content: Thought content normal.        Judgment: Judgment normal.     Last depression screening scores PHQ 2/9 Scores 02/17/2020 01/25/2019 04/23/2017  PHQ -  2 Score 0 0 0  PHQ- 9 Score 1 0 -   Last fall risk screening Fall Risk  02/17/2020  Falls in the past year? 0  Number falls in past yr: 0  Injury with Fall? 0  Risk for fall due to : No Fall Risks  Follow up Falls evaluation completed   Last Audit-C alcohol use screening Alcohol Use Disorder Test (AUDIT) 02/17/2020  1. How often do you have a drink containing alcohol? 1  2. How many drinks containing alcohol do you have on a typical day when you are drinking? 0  3. How often do you have six or more drinks on one occasion? 0  AUDIT-C Score 1  Alcohol Brief Interventions/Follow-up AUDIT Score <7 follow-up not indicated   A score of 3 or more in women, and 4 or more in men indicates increased risk for alcohol abuse, EXCEPT if all of the points are from question 1   No results found for any visits on  02/17/20.  Assessment & Plan    Routine Health Maintenance and Physical Exam  Exercise Activities and Dietary recommendations Goals   None     Immunization History  Administered Date(s) Administered  . Influenza Split 06/05/2010, 08/15/2011  . Influenza,inj,Quad PF,6+ Mos 04/23/2017, 06/12/2018, 05/31/2019  . Tdap 06/05/2010    Health Maintenance  Topic Date Due  . COVID-19 Vaccine (1) Never done  . INFLUENZA VACCINE  03/19/2020  . TETANUS/TDAP  06/05/2020  . PAP SMEAR-Modifier  05/30/2022  . Hepatitis C Screening  Completed  . HIV Screening  Completed    Discussed health benefits of physical activity, and encouraged her to engage in regular exercise appropriate for her age and condition.  1. Annual physical exam Normal physical exam today. Will check labs as below and f/u pending lab results. If labs are stable and WNL she will not need to have these rechecked for one year at her next annual physical exam. She is to call the office in the meantime if she has any acute issue, questions or concerns.  2. Encounter for breast cancer screening using non-mammogram modality Breast exam today was normal. There is no family history of breast cancer. She does perform regular self breast exams. Mammogram was ordered as below. Information for [facility] clinic was given to patient so she may schedule her mammogram at her convenience. - MM 3D SCREEN BREAST BILATERAL; Future  3. Situational anxiety Hydroxyzine 25mg  was too strong for patient and made her too drowsy. Will decrease dose to 10mg  as below. F/U in 4-6 weeks via mychart message to confirm is this dose is better.  - hydrOXYzine (ATARAX/VISTARIL) 10 MG tablet; Take 1 tablet (10 mg total) by mouth 3 (three) times daily as needed.  Dispense: 30 tablet; Refill: 0 - CBC w/Diff/Platelet - TSH  4. Hypercholesterolemia without hypertriglyceridemia Diet controlled. Will check labs as below and f/u pending results. - CBC  w/Diff/Platelet - Comprehensive Metabolic Panel (CMET) - Lipid Panel With LDL/HDL Ratio  5. Avitaminosis D H/O this. Will check labs as below and f/u pending results. - CBC w/Diff/Platelet - Vitamin D (25 hydroxy)  6. Hair loss New. Will check labs to R/O organic source, possibly from stress (had recent separation with her husband in May, but they are back together and working things out).  - CBC w/Diff/Platelet - Comprehensive Metabolic Panel (CMET) - Fe+TIBC+Fer   No follow-ups on file.     , PA-C, have reviewed all documentation for this  visit. The documentation on 02/27/20 for the exam, diagnosis, procedures, and orders are all accurate and complete.   Reine Just  [facility] [phone] (phone) [phone] (fax)  [facility]

## 2020-02-16 ENCOUNTER — Telehealth: Payer: Self-pay

## 2020-02-16 NOTE — Telephone Encounter (Signed)
Copied from CRM [id number]. Topic: General - Other >> Feb 16, 2020  8:53 AM Lyn Hollingshead D wrote: Reason for CRM: PT has and app tomorrow at 9:40 / she wants her husband to take her slot  Victoria [name]. [facility] Female, 39 y.o., 07/16/1983 [phone] MRN:  [id number] Her appt is for a Physical his is just office visit / please advise >> Feb 16, 2020 10:51 AM Gwenlyn Fudge wrote: Pt changed her mind. Please disregard message.

## 2020-02-17 ENCOUNTER — Ambulatory Visit (INDEPENDENT_AMBULATORY_CARE_PROVIDER_SITE_OTHER): Payer: Managed Care, Other (non HMO) | Admitting: Physician Assistant

## 2020-02-17 ENCOUNTER — Encounter: Payer: Self-pay | Admitting: Physician Assistant

## 2020-02-17 ENCOUNTER — Other Ambulatory Visit: Payer: Self-pay

## 2020-02-17 VITALS — BP 118/77 | HR 93 | Temp 97.5°F | Resp 16 | Ht 66.5 in | Wt 139.0 lb

## 2020-02-17 DIAGNOSIS — E559 Vitamin D deficiency, unspecified: Secondary | ICD-10-CM

## 2020-02-17 DIAGNOSIS — Z1239 Encounter for other screening for malignant neoplasm of breast: Secondary | ICD-10-CM | POA: Diagnosis not present

## 2020-02-17 DIAGNOSIS — E7800 Pure hypercholesterolemia, unspecified: Secondary | ICD-10-CM | POA: Diagnosis not present

## 2020-02-17 DIAGNOSIS — Z0000 Encounter for general adult medical examination without abnormal findings: Secondary | ICD-10-CM

## 2020-02-17 DIAGNOSIS — F418 Other specified anxiety disorders: Secondary | ICD-10-CM

## 2020-02-17 DIAGNOSIS — L659 Nonscarring hair loss, unspecified: Secondary | ICD-10-CM | POA: Diagnosis not present

## 2020-02-17 MED ORDER — HYDROXYZINE HCL 10 MG PO TABS: 10.0000 mg | ORAL_TABLET | Freq: Three times a day (TID) | ORAL | 0 refills | Status: DC | PRN

## 2020-02-17 NOTE — Patient Instructions (Signed)
Health Maintenance, Female Adopting a healthy lifestyle and getting preventive care are important in promoting health and wellness. Ask your health care provider about:  The right schedule for you to have regular tests and exams.  Things you can do on your own to prevent diseases and keep yourself healthy. What should I know about diet, weight, and exercise? Eat a healthy diet   Eat a diet that includes plenty of vegetables, fruits, low-fat dairy products, and lean protein.  Do not eat a lot of foods that are high in solid fats, added sugars, or sodium. Maintain a healthy weight Body mass index (BMI) is used to identify weight problems. It estimates body fat based on height and weight. Your health care provider can help determine your BMI and help you achieve or maintain a healthy weight. Get regular exercise Get regular exercise. This is one of the most important things you can do for your health. Most adults should:  Exercise for at least 150 minutes each week. The exercise should increase your heart rate and make you sweat (moderate-intensity exercise).  Do strengthening exercises at least twice a week. This is in addition to the moderate-intensity exercise.  Spend less time sitting. Even light physical activity can be beneficial. Watch cholesterol and blood lipids Have your blood tested for lipids and cholesterol at 40 years of age, then have this test every 5 years. Have your cholesterol levels checked more often if:  Your lipid or cholesterol levels are high.  You are older than 40 years of age.  You are at high risk for heart disease. What should I know about cancer screening? Depending on your health history and family history, you may need to have cancer screening at various ages. This may include screening for:  Breast cancer.  Cervical cancer.  Colorectal cancer.  Skin cancer.  Lung cancer. What should I know about heart disease, diabetes, and high blood  pressure? Blood pressure and heart disease  High blood pressure causes heart disease and increases the risk of stroke. This is more likely to develop in people who have high blood pressure readings, are of African descent, or are overweight.  Have your blood pressure checked: ? Every 3-5 years if you are 18-39 years of age. ? Every year if you are 40 years old or older. Diabetes Have regular diabetes screenings. This checks your fasting blood sugar level. Have the screening done:  Once every three years after age 40 if you are at a normal weight and have a low risk for diabetes.  More often and at a younger age if you are overweight or have a high risk for diabetes. What should I know about preventing infection? Hepatitis B If you have a higher risk for hepatitis B, you should be screened for this virus. Talk with your health care provider to find out if you are at risk for hepatitis B infection. Hepatitis C Testing is recommended for:  Everyone born from 1945 through 1965.  Anyone with known risk factors for hepatitis C. Sexually transmitted infections (STIs)  Get screened for STIs, including gonorrhea and chlamydia, if: ? You are sexually active and are younger than 40 years of age. ? You are older than 40 years of age and your health care provider tells you that you are at risk for this type of infection. ? Your sexual activity has changed since you were last screened, and you are at increased risk for chlamydia or gonorrhea. Ask your health care provider if   you are at risk.  Ask your health care provider about whether you are at high risk for HIV. Your health care provider may recommend a prescription medicine to help prevent HIV infection. If you choose to take medicine to prevent HIV, you should first get tested for HIV. You should then be tested every 3 months for as long as you are taking the medicine. Pregnancy  If you are about to stop having your period (premenopausal) and  you may become pregnant, seek counseling before you get pregnant.  Take 400 to 800 micrograms (mcg) of folic acid every day if you become pregnant.  Ask for birth control (contraception) if you want to prevent pregnancy. Osteoporosis and menopause Osteoporosis is a disease in which the bones lose minerals and strength with aging. This can result in bone fractures. If you are 65 years old or older, or if you are at risk for osteoporosis and fractures, ask your health care provider if you should:  Be screened for bone loss.  Take a calcium or vitamin D supplement to lower your risk of fractures.  Be given hormone replacement therapy (HRT) to treat symptoms of menopause. Follow these instructions at home: Lifestyle  Do not use any products that contain nicotine or tobacco, such as cigarettes, e-cigarettes, and chewing tobacco. If you need help quitting, ask your health care provider.  Do not use street drugs.  Do not share needles.  Ask your health care provider for help if you need support or information about quitting drugs. Alcohol use  Do not drink alcohol if: ? Your health care provider tells you not to drink. ? You are pregnant, may be pregnant, or are planning to become pregnant.  If you drink alcohol: ? Limit how much you use to 0-1 drink a day. ? Limit intake if you are breastfeeding.  Be aware of how much alcohol is in your drink. In the [place] one drink equals one 12 oz bottle of beer (355 mL), one 5 oz glass of wine (148 mL), or one 1 oz glass of hard liquor (44 mL). General instructions  Schedule regular health, dental, and eye exams.  Stay current with your vaccines.  Tell your health care provider if: ? You often feel depressed. ? You have ever been abused or do not feel safe at home. Summary  Adopting a healthy lifestyle and getting preventive care are important in promoting health and wellness.  Follow your health care provider's instructions about healthy  diet, exercising, and getting tested or screened for diseases.  Follow your health care provider's instructions on monitoring your cholesterol and blood pressure. This information is not intended to replace advice given to you by your health care provider. Make sure you discuss any questions you have with your health care provider. Document Revised: 07/29/2018 Document Reviewed: 07/29/2018 Elsevier Patient Education  2020 Elsevier Inc.  

## 2020-02-18 ENCOUNTER — Telehealth: Payer: Self-pay

## 2020-02-18 LAB — TSH: TSH: 1.61 u[IU]/mL (ref 0.450–4.500)

## 2020-02-18 LAB — LIPID PANEL WITH LDL/HDL RATIO
Cholesterol, Total: 192 mg/dL (ref 100–199)
HDL: 50 mg/dL (ref >39)
LDL Chol Calc (NIH): 127 mg/dL — ABNORMAL HIGH (ref 0–99)
LDL/HDL Ratio: 2.5 ratio (ref 0.0–3.2)
Triglycerides: 84 mg/dL (ref 0–149)
VLDL Cholesterol Cal: 15 mg/dL (ref 5–40)

## 2020-02-18 LAB — IRON,TIBC AND FERRITIN PANEL
Ferritin: 23 ng/mL (ref 15–150)
Iron Saturation: 25 % (ref 15–55)
Iron: 76 ug/dL (ref 27–159)
Total Iron Binding Capacity: 308 ug/dL (ref 250–450)
UIBC: 232 ug/dL (ref 131–425)

## 2020-02-18 LAB — CBC WITH DIFFERENTIAL/PLATELET
Basophils Absolute: 0 10*3/uL (ref 0.0–0.2)
Basos: 1 %
EOS (ABSOLUTE): 0 10*3/uL (ref 0.0–0.4)
Eos: 1 %
Hematocrit: 38.6 % (ref 34.0–46.6)
Hemoglobin: 12.7 g/dL (ref 11.1–15.9)
Immature Grans (Abs): 0 10*3/uL (ref 0.0–0.1)
Immature Granulocytes: 0 %
Lymphocytes Absolute: 1.7 10*3/uL (ref 0.7–3.1)
Lymphs: 29 %
MCH: 31 pg (ref 26.6–33.0)
MCHC: 32.9 g/dL (ref 31.5–35.7)
MCV: 94 fL (ref 79–97)
Monocytes Absolute: 0.4 10*3/uL (ref 0.1–0.9)
Monocytes: 7 %
Neutrophils Absolute: 3.7 10*3/uL (ref 1.4–7.0)
Neutrophils: 62 %
Platelets: 288 10*3/uL (ref 150–450)
RBC: 4.1 x10E6/uL (ref 3.77–5.28)
RDW: 12.1 % (ref 11.7–15.4)
WBC: 5.8 10*3/uL (ref 3.4–10.8)

## 2020-02-18 LAB — COMPREHENSIVE METABOLIC PANEL
ALT: 11 IU/L (ref 0–32)
AST: 16 IU/L (ref 0–40)
Albumin/Globulin Ratio: 1.7 (ref 1.2–2.2)
Albumin: 4 g/dL (ref 3.8–4.8)
Alkaline Phosphatase: 59 IU/L (ref 48–121)
BUN/Creatinine Ratio: 19 (ref 9–23)
BUN: 12 mg/dL (ref 6–20)
Bilirubin Total: 0.3 mg/dL (ref 0.0–1.2)
CO2: 22 mmol/L (ref 20–29)
Calcium: 9 mg/dL (ref 8.7–10.2)
Chloride: 104 mmol/L (ref 96–106)
Creatinine, Ser: 0.64 mg/dL (ref 0.57–1.00)
GFR calc Af Amer: 130 mL/min/{1.73_m2} (ref >59)
GFR calc non Af Amer: 113 mL/min/{1.73_m2} (ref >59)
Globulin, Total: 2.4 g/dL (ref 1.5–4.5)
Glucose: 87 mg/dL (ref 65–99)
Potassium: 4.1 mmol/L (ref 3.5–5.2)
Sodium: 138 mmol/L (ref 134–144)
Total Protein: 6.4 g/dL (ref 6.0–8.5)

## 2020-02-18 LAB — VITAMIN D 25 HYDROXY (VIT D DEFICIENCY, FRACTURES): Vit D, 25-Hydroxy: 25.6 ng/mL — ABNORMAL LOW (ref 30.0–100.0)

## 2020-02-18 NOTE — Telephone Encounter (Signed)
-----   Message from Margaretann Loveless, [place] sent at 02/18/2020  8:21 AM EDT ----- Blood count is normal. Kidney and liver function is normal. Sodium, potassium, and calcium is normal. Thyroid is normal. Cholesterol has improved compared to last year. Vit D is borderline low. Recommend to take OTC Vit D 1000-2000 IU daily. Iron levels are normal.

## 2020-02-18 NOTE — Telephone Encounter (Signed)
Patient advised as directed below. 

## 2020-03-10 ENCOUNTER — Telehealth: Payer: Self-pay

## 2020-03-10 NOTE — Telephone Encounter (Signed)
Patient advised the referral has been placed. She can call [place] to schedule mammogram.

## 2020-03-10 NOTE — Telephone Encounter (Signed)
Copied from CRM [id number]. Topic: General - Other >> Mar 10, 2020  9:11 AM Wyonia Hough E wrote: Reason for CRM: Pt called to see if a mammogram appt had been set up for her/ Please advise

## 2020-03-27 ENCOUNTER — Telehealth (INDEPENDENT_AMBULATORY_CARE_PROVIDER_SITE_OTHER): Payer: Managed Care, Other (non HMO) | Admitting: Physician Assistant

## 2020-03-27 ENCOUNTER — Encounter: Payer: Self-pay | Admitting: Physician Assistant

## 2020-03-27 ENCOUNTER — Ambulatory Visit: Payer: Self-pay

## 2020-03-27 DIAGNOSIS — R05 Cough: Secondary | ICD-10-CM | POA: Diagnosis not present

## 2020-03-27 DIAGNOSIS — R059 Cough, unspecified: Secondary | ICD-10-CM

## 2020-03-27 DIAGNOSIS — R432 Parageusia: Secondary | ICD-10-CM

## 2020-03-27 DIAGNOSIS — M791 Myalgia, unspecified site: Secondary | ICD-10-CM

## 2020-03-27 DIAGNOSIS — R430 Anosmia: Secondary | ICD-10-CM

## 2020-03-27 DIAGNOSIS — J029 Acute pharyngitis, unspecified: Secondary | ICD-10-CM

## 2020-03-27 MED ORDER — PREDNISONE 10 MG (21) PO TBPK: ORAL_TABLET | ORAL | 0 refills | Status: DC

## 2020-03-27 MED ORDER — HYDROCOD POLST-CPM POLST ER 10-8 MG/5ML PO SUER: 5.0000 mL | Freq: Two times a day (BID) | ORAL | 0 refills | Status: DC | PRN

## 2020-03-27 NOTE — Progress Notes (Signed)
MyChart Video Visit    Virtual Visit via Video Note   This visit type was conducted due to national recommendations for restrictions regarding the COVID-19 Pandemic (e.g. social distancing) in an effort to limit this patient's exposure and mitigate transmission in our community. This patient is at least at moderate risk for complications without adequate follow up. This format is felt to be most appropriate for this patient at this time. Physical exam was limited by quality of the video and audio technology used for the visit.   I connected with patient on 03/27/20 at  1:00 PM EDT by a video enabled telemedicine application and verified that I am speaking with the correct person using two identifiers.  I discussed the limitations of evaluation and management by telemedicine and the availability of in person appointments. The patient expressed understanding and agreed to proceed.  Patient location: Home Provider location: [facility]   [name]   DOB: June 27, 1981   39 y.o. Female  MRN: [id number] Visit Date: 03/27/2020  Today's healthcare provider: Margaretann Loveless, PA-C   No chief complaint on file.  Subjective    Sore Throat  This is a new problem. The current episode started in the past 7 days (friday, 03/24/20). The problem has been gradually worsening. Neither side of throat is experiencing more pain than the other. There has been no fever. The pain is at a severity of 5/10. The pain is moderate. Associated symptoms include congestion, coughing, ear pain (with swallowing only), headaches and shortness of breath (with activity only). Pertinent negatives include no abdominal pain, diarrhea, ear discharge, hoarse voice, plugged ear sensation, neck pain, stridor, swollen glands, trouble swallowing or vomiting. She has tried NSAIDs and gargles (chloraseptic spray) for the symptoms. The treatment provided no relief.    Started Friday while at the movies felt very fatigued and  tired. Became very achy. Mild chills. Feels warm to touch but no measured fever. Having intermittent cough, mildly productive of clear to white sputum. Ear pain with swallowing. Reports throat is red but no white patches. Kids and husband sick as well. Loss of smell and taste. Can taste salt but otherwise limited taste. Everything smells like cardboard or dirt. Was covid tested this morning.   Patient Active Problem List   Diagnosis Date Noted  . Hx of renal calculi 12/20/2015  . Hypercholesterolemia without hypertriglyceridemia 02/23/2015  . Caries 02/23/2015  . Avitaminosis D 02/23/2015   Past Medical History:  Diagnosis Date  . Abnormal Pap smear of cervix   . Calculus of kidney    left distal ureteral stone  . Dyspareunia       Medications: Outpatient Medications Prior to Visit  Medication Sig  . hydrOXYzine (ATARAX/VISTARIL) 10 MG tablet Take 1 tablet (10 mg total) by mouth 3 (three) times daily as needed.  Marland Kitchen MYRBETRIQ 25 MG TB24 tablet    No facility-administered medications prior to visit.    Review of Systems  Constitutional: Positive for chills and fatigue. Negative for fever.  HENT: Positive for congestion, ear pain (with swallowing only) and sore throat. Negative for ear discharge, hoarse voice, postnasal drip, rhinorrhea, sinus pressure, sinus pain and trouble swallowing.   Respiratory: Positive for cough, chest tightness and shortness of breath (with activity only). Negative for stridor.   Cardiovascular: Negative for chest pain, palpitations and leg swelling.  Gastrointestinal: Negative for abdominal pain, diarrhea and vomiting.  Musculoskeletal: Negative for neck pain.  Neurological: Positive for headaches. Negative for dizziness.  Last CBC Lab Results  Component Value Date   WBC 5.8 02/17/2020   HGB 12.7 02/17/2020   HCT 38.6 02/17/2020   MCV 94 02/17/2020   MCH 31.0 02/17/2020   RDW 12.1 02/17/2020   PLT 288 02/17/2020   Last metabolic panel Lab  Results  Component Value Date   GLUCOSE 87 02/17/2020   NA 138 02/17/2020   K 4.1 02/17/2020   CL 104 02/17/2020   CO2 22 02/17/2020   BUN 12 02/17/2020   CREATININE 0.64 02/17/2020   GFRNONAA 113 02/17/2020   GFRAA 130 02/17/2020   CALCIUM 9.0 02/17/2020   PROT 6.4 02/17/2020   ALBUMIN 4.0 02/17/2020   LABGLOB 2.4 02/17/2020   AGRATIO 1.7 02/17/2020   BILITOT 0.3 02/17/2020   ALKPHOS 59 02/17/2020   AST 16 02/17/2020   ALT 11 02/17/2020      Objective    There were no vitals taken for this visit. BP Readings from Last 3 Encounters:  02/17/20 118/77  12/21/19 136/78  05/31/19 106/82   Wt Readings from Last 3 Encounters:  02/17/20 139 lb (63 kg)  05/31/19 158 lb 9.6 oz (71.9 kg)  01/25/19 157 lb (71.2 kg)      Physical Exam Vitals reviewed.  Constitutional:      Appearance: Normal appearance. She is well-developed and normal weight. She is ill-appearing.  HENT:     Head: Normocephalic and atraumatic.  Pulmonary:     Effort: Pulmonary effort is normal. No respiratory distress.  Musculoskeletal:     Cervical back: Normal range of motion and neck supple.  Neurological:     Mental Status: She is alert.  Psychiatric:        Mood and Affect: Mood normal.        Behavior: Behavior normal.        Thought Content: Thought content normal.        Judgment: Judgment normal.        Assessment & Plan     1. Sore throat Will give prednisone as below for throat and chest tightness. May use Ibuprofen and tylenol prn for muscle aches and fever. Salt water gargles for throat pain. Chloraseptic spray prn. Push fluids and rest.  - predniSONE (STERAPRED UNI-PAK 21 TAB) 10 MG (21) TBPK tablet; 6 day taper; take as directed on package instructions  Dispense: 21 tablet; Refill: 0  2. Cough Worsening symptoms that has not responded to OTC medications. Will give Tussionex cough syrup as below for nighttime cough. Drowsiness precautions given to patient. Stay well hydrated. Use  delsym, robitussin OR mucinex for daytime cough. - chlorpheniramine-HYDROcodone (TUSSIONEX PENNKINETIC ER) 10-8 MG/5ML SUER; Take 5 mLs by mouth every 12 (twelve) hours as needed for cough.  Dispense: 140 mL; Refill: 0 - predniSONE (STERAPRED UNI-PAK 21 TAB) 10 MG (21) TBPK tablet; 6 day taper; take as directed on package instructions  Dispense: 21 tablet; Refill: 0  3. Myalgia Tylenol and IBU prn. Awaiting covid results. Isolate.   4. Loss of smell See above medical treatment plan.  5. Loss of taste See above medical treatment plan.   No follow-ups on file.     I discussed the assessment and treatment plan with the patient. The patient was provided an opportunity to ask questions and all were answered. The patient agreed with the plan and demonstrated an understanding of the instructions.   The patient was advised to call back or seek an in-person evaluation if the symptoms worsen or if the condition fails  to improve as anticipated.  I provided 14 minutes of non-face-to-face time during this encounter.  Delmer Islam, PA-C, have reviewed all documentation for this visit. The documentation on 03/27/20 for the exam, diagnosis, procedures, and orders are all accurate and complete.  Reine Just [facility] [phone] (phone) [phone] (fax)  [facility]

## 2020-03-27 NOTE — Telephone Encounter (Addendum)
Pt. Reports she started feeling sick Friday with sore throat and body aches. Has chills, runny nose. No fever. Husband tested negative for COVID 19 and has a sinus infection. Spoke with Okey Regal in the practice and virtual appointment made for today. Pt. Verbalizes understanding.  Reason for Disposition . SEVERE (e.g., excruciating) throat pain  Answer Assessment - Initial Assessment Questions 1. ONSET: "When did the throat start hurting?" (Hours or days ago)      Friday 2. SEVERITY: "How bad is the sore throat?" (Scale 1-10; mild, moderate or severe)   - MILD (1-3):  doesn't interfere with eating or normal activities   - MODERATE (4-7): interferes with eating some solids and normal activities   - SEVERE (8-10):  excruciating pain, interferes with most normal activities   - SEVERE DYSPHAGIA: can't swallow liquids, drooling     Moderate 3. STREP EXPOSURE: "Has there been any exposure to strep within the past week?" If Yes, ask: "What type of contact occurred?"      No 4.  VIRAL SYMPTOMS: "Are there any symptoms of a cold, such as a runny nose, cough, hoarse voice or red eyes?"      Runny nose, dry cough 5. FEVER: "Do you have a fever?" If Yes, ask: "What is your temperature, how was it measured, and when did it start?"     No 6. PUS ON THE TONSILS: "Is there pus on the tonsils in the back of your throat?"     Unsure 7. OTHER SYMPTOMS: "Do you have any other symptoms?" (e.g., difficulty breathing, headache, rash)     Achy 8. PREGNANCY: "Is there any chance you are pregnant?" "When was your last menstrual period?"     No  Protocols used: SORE THROAT-A-AH

## 2020-04-26 ENCOUNTER — Other Ambulatory Visit: Payer: Self-pay | Admitting: Physician Assistant

## 2020-04-26 DIAGNOSIS — F418 Other specified anxiety disorders: Secondary | ICD-10-CM

## 2020-06-06 ENCOUNTER — Ambulatory Visit (INDEPENDENT_AMBULATORY_CARE_PROVIDER_SITE_OTHER): Payer: Managed Care, Other (non HMO) | Admitting: Family Medicine

## 2020-06-06 ENCOUNTER — Other Ambulatory Visit: Payer: Self-pay

## 2020-06-06 ENCOUNTER — Encounter: Payer: Self-pay | Admitting: Family Medicine

## 2020-06-06 VITALS — BP 124/83 | HR 87 | Ht 66.5 in | Wt 134.4 lb

## 2020-06-06 DIAGNOSIS — N3281 Overactive bladder: Secondary | ICD-10-CM

## 2020-06-06 DIAGNOSIS — Z01419 Encounter for gynecological examination (general) (routine) without abnormal findings: Secondary | ICD-10-CM | POA: Diagnosis not present

## 2020-06-06 DIAGNOSIS — Z23 Encounter for immunization: Secondary | ICD-10-CM

## 2020-06-06 DIAGNOSIS — Z124 Encounter for screening for malignant neoplasm of cervix: Secondary | ICD-10-CM

## 2020-06-06 MED ORDER — MYRBETRIQ 25 MG PO TB24: 25.0000 mg | ORAL_TABLET | Freq: Every day | ORAL | 3 refills | Status: DC

## 2020-06-06 NOTE — Assessment & Plan Note (Signed)
Recommend Kegels 3-4 x/day and continue her meds for overactive bladder

## 2020-06-06 NOTE — Progress Notes (Signed)
  Subjective:     Victoria [name] is a 39 y.o. female and is here for a comprehensive physical exam. The patient reports no problems. Reports she and her husband separated briefly and desires STD check. She is still working from home through [employer]. Cycles are regular. S/p BTL.    The following portions of the patient's history were reviewed and updated as appropriate: allergies, current medications, past family history, past medical history, past social history, past surgical history and problem list.  Review of Systems Pertinent items noted in HPI and remainder of comprehensive ROS otherwise negative.   Objective:    BP 124/83   Pulse 87   Ht 5' 6.5" (1.689 m)   Wt 134 lb 6.4 oz (61 kg)   LMP 05/25/2020 (Exact Date)   BMI 21.37 kg/m  General appearance: alert, cooperative and appears stated age Head: Normocephalic, without obvious abnormality, atraumatic Neck: no adenopathy, supple, symmetrical, trachea midline and thyroid not enlarged, symmetric, no tenderness/mass/nodules Lungs: clear to auscultation bilaterally Breasts: normal appearance, no masses or tenderness Heart: regular rate and rhythm, S1, S2 normal, no murmur, click, rub or gallop Abdomen: soft, non-tender; bowel sounds normal; no masses,  no organomegaly Pelvic: cervix normal in appearance, external genitalia normal, no adnexal masses or tenderness, no cervical motion tenderness, uterus normal size, shape, and consistency and vagina normal without discharge Extremities: extremities normal, atraumatic, no cyanosis or edema Pulses: 2+ and symmetric Skin: Skin color, texture, turgor normal. No rashes or lesions Lymph nodes: Cervical, supraclavicular, and axillary nodes normal. Neurologic: Grossly normal    Assessment:    Healthy female exam.      Plan:   Problem List Items Addressed This Visit      Unprioritized   Overactive bladder - Primary    Recommend Kegels 3-4 x/day and continue her meds for overactive  bladder      Relevant Medications   MYRBETRIQ 25 MG TB24 tablet    Other Visit Diagnoses    Screening for malignant neoplasm of cervix       Encounter for gynecological examination without abnormal finding       Relevant Orders   Hepatitis B surface antigen   Hepatitis C antibody   HIV Antibody (routine testing w rflx)   RPR   Flu Vaccine QUAD 36+ mos IM (Fluarix, Quad PF) (Completed)   IGP,CtNgTv,Apt HPV,rfx16/18,45       Return in 1 year (on 06/06/2021).    See After Visit Summary for Counseling Recommendations

## 2020-06-06 NOTE — Patient Instructions (Signed)
Preventive Care 21-39 Years Old, Female Preventive care refers to visits with your health care provider and lifestyle choices that can promote health and wellness. This includes:  A yearly physical exam. This may also be called an annual well check.  Regular dental visits and eye exams.  Immunizations.  Screening for certain conditions.  Healthy lifestyle choices, such as eating a healthy diet, getting regular exercise, not using drugs or products that contain nicotine and tobacco, and limiting alcohol use. What can I expect for my preventive care visit? Physical exam Your health care provider will check your:  Height and weight. This may be used to calculate body mass index (BMI), which tells if you are at a healthy weight.  Heart rate and blood pressure.  Skin for abnormal spots. Counseling Your health care provider may ask you questions about your:  Alcohol, tobacco, and drug use.  Emotional well-being.  Home and relationship well-being.  Sexual activity.  Eating habits.  Work and work environment.  Method of birth control.  Menstrual cycle.  Pregnancy history. What immunizations do I need?  Influenza (flu) vaccine  This is recommended every year. Tetanus, diphtheria, and pertussis (Tdap) vaccine  You may need a Td booster every 10 years. Varicella (chickenpox) vaccine  You may need this if you have not been vaccinated. Human papillomavirus (HPV) vaccine  If recommended by your health care provider, you may need three doses over 6 months. Measles, mumps, and rubella (MMR) vaccine  You may need at least one dose of MMR. You may also need a second dose. Meningococcal conjugate (MenACWY) vaccine  One dose is recommended if you are age 19-21 years and a first-year college student living in a residence hall, or if you have one of several medical conditions. You may also need additional booster doses. Pneumococcal conjugate (PCV13) vaccine  You may need  this if you have certain conditions and were not previously vaccinated. Pneumococcal polysaccharide (PPSV23) vaccine  You may need one or two doses if you smoke cigarettes or if you have certain conditions. Hepatitis A vaccine  You may need this if you have certain conditions or if you travel or work in places where you may be exposed to hepatitis A. Hepatitis B vaccine  You may need this if you have certain conditions or if you travel or work in places where you may be exposed to hepatitis B. Haemophilus influenzae type b (Hib) vaccine  You may need this if you have certain conditions. You may receive vaccines as individual doses or as more than one vaccine together in one shot (combination vaccines). Talk with your health care provider about the risks and benefits of combination vaccines. What tests do I need?  Blood tests  Lipid and cholesterol levels. These may be checked every 5 years starting at age 20.  Hepatitis C test.  Hepatitis B test. Screening  Diabetes screening. This is done by checking your blood sugar (glucose) after you have not eaten for a while (fasting).  Sexually transmitted disease (STD) testing.  BRCA-related cancer screening. This may be done if you have a family history of breast, ovarian, tubal, or peritoneal cancers.  Pelvic exam and Pap test. This may be done every 3 years starting at age 21. Starting at age 30, this may be done every 5 years if you have a Pap test in combination with an HPV test. Talk with your health care provider about your test results, treatment options, and if necessary, the need for more tests.   Follow these instructions at home: Eating and drinking   Eat a diet that includes fresh fruits and vegetables, whole grains, lean protein, and low-fat dairy.  Take vitamin and mineral supplements as recommended by your health care provider.  Do not drink alcohol if: ? Your health care provider tells you not to drink. ? You are  pregnant, may be pregnant, or are planning to become pregnant.  If you drink alcohol: ? Limit how much you have to 0-1 drink a day. ? Be aware of how much alcohol is in your drink. In the [place] one drink equals one 12 oz bottle of beer (355 mL), one 5 oz glass of wine (148 mL), or one 1 oz glass of hard liquor (44 mL). Lifestyle  Take daily care of your teeth and gums.  Stay active. Exercise for at least 30 minutes on 5 or more days each week.  Do not use any products that contain nicotine or tobacco, such as cigarettes, e-cigarettes, and chewing tobacco. If you need help quitting, ask your health care provider.  If you are sexually active, practice safe sex. Use a condom or other form of birth control (contraception) in order to prevent pregnancy and STIs (sexually transmitted infections). If you plan to become pregnant, see your health care provider for a preconception visit. What's next?  Visit your health care provider once a year for a well check visit.  Ask your health care provider how often you should have your eyes and teeth checked.  Stay up to date on all vaccines. This information is not intended to replace advice given to you by your health care provider. Make sure you discuss any questions you have with your health care provider. Document Revised: 04/16/2018 Document Reviewed: 04/16/2018 Elsevier Patient Education  2020 [employer].

## 2020-06-07 LAB — HEPATITIS B SURFACE ANTIGEN: Hepatitis B Surface Ag: NEGATIVE

## 2020-06-07 LAB — HEPATITIS C ANTIBODY: Hep C Virus Ab: <0.1 s/co ratio (ref 0.0–0.9)

## 2020-06-07 LAB — HIV ANTIBODY (ROUTINE TESTING W REFLEX): HIV Screen 4th Generation wRfx: NONREACTIVE

## 2020-06-07 LAB — RPR: RPR Ser Ql: NONREACTIVE

## 2020-06-12 LAB — IGP,CTNGTV,APT HPV,RFX16/18,45
Chlamydia, Nuc. Acid Amp: NEGATIVE
Gonococcus, Nuc. Acid Amp: NEGATIVE
HPV Aptima: NEGATIVE
PAP Smear Comment: 0
Trich vag by NAA: NEGATIVE

## 2020-06-20 ENCOUNTER — Other Ambulatory Visit: Payer: Self-pay

## 2020-06-20 ENCOUNTER — Ambulatory Visit
Admission: RE | Admit: 2020-06-20 | Discharge: 2020-06-20 | Disposition: A | Discharge status: Home / Self Care | Payer: Managed Care, Other (non HMO) | Source: Ambulatory Visit | Attending: Physician Assistant | Admitting: Physician Assistant

## 2020-06-20 DIAGNOSIS — Z1231 Encounter for screening mammogram for malignant neoplasm of breast: Secondary | ICD-10-CM | POA: Diagnosis present

## 2020-06-20 DIAGNOSIS — Z1239 Encounter for other screening for malignant neoplasm of breast: Secondary | ICD-10-CM

## 2020-07-11 ENCOUNTER — Other Ambulatory Visit: Payer: Self-pay | Admitting: Physician Assistant

## 2020-07-11 DIAGNOSIS — F418 Other specified anxiety disorders: Secondary | ICD-10-CM

## 2020-07-24 ENCOUNTER — Telehealth: Payer: Self-pay

## 2020-07-24 NOTE — Telephone Encounter (Signed)
Pt called concerned about brown discharge. Pt stated her cycle ended last Sunday (07/16/2020) and she had intercourse on 07/23/20. Advised pt this is normal and if symptoms worsen to call us back. Pt verbalized understanding.

## 2020-10-02 ENCOUNTER — Ambulatory Visit: Payer: Managed Care, Other (non HMO)

## 2020-10-31 ENCOUNTER — Other Ambulatory Visit: Payer: Self-pay | Admitting: Physician Assistant

## 2020-10-31 DIAGNOSIS — F418 Other specified anxiety disorders: Secondary | ICD-10-CM

## 2020-11-16 ENCOUNTER — Telehealth (INDEPENDENT_AMBULATORY_CARE_PROVIDER_SITE_OTHER): Payer: Managed Care, Other (non HMO) | Admitting: Adult Health

## 2020-11-16 DIAGNOSIS — R110 Nausea: Secondary | ICD-10-CM | POA: Diagnosis not present

## 2020-11-16 DIAGNOSIS — K219 Gastro-esophageal reflux disease without esophagitis: Secondary | ICD-10-CM | POA: Diagnosis not present

## 2020-11-16 MED ORDER — PANTOPRAZOLE SODIUM 40 MG PO TBEC: 40.0000 mg | DELAYED_RELEASE_TABLET | Freq: Every day | ORAL | 0 refills | Status: DC

## 2020-11-16 NOTE — Patient Instructions (Signed)
Pantoprazole tablets What is this medicine? PANTOPRAZOLE (pan TOE pra zole) prevents the production of acid in the stomach. It is used to treat gastroesophageal reflux disease (GERD), inflammation of the esophagus, and Zollinger-Ellison syndrome. This medicine may be used for other purposes; ask your health care provider or pharmacist if you have questions. COMMON BRAND NAME(S): Protonix What should I tell my health care provider before I take this medicine? They need to know if you have any of these conditions:  liver disease  low levels of magnesium in the blood  lupus  an unusual or allergic reaction to omeprazole, lansoprazole, pantoprazole, rabeprazole, other medicines, foods, dyes, or preservatives  pregnant or trying to get pregnant  breast-feeding How should I use this medicine? Take this medicine by mouth. Swallow the tablets whole with a drink of water. Follow the directions on the prescription label. Do not crush, break, or chew. Take your medicine at regular intervals. Do not take your medicine more often than directed. Talk to your pediatrician regarding the use of this medicine in children. While this drug may be prescribed for children as young as 5 years for selected conditions, precautions do apply. Overdosage: If you think you have taken too much of this medicine contact a poison control center or emergency room at once. NOTE: This medicine is only for you. Do not share this medicine with others. What if I miss a dose? If you miss a dose, take it as soon as you can. If it is almost time for your next dose, take only that dose. Do not take double or extra doses. What may interact with this medicine? Do not take this medicine with any of the following medications:  atazanavir  nelfinavir This medicine may also interact with the following medications:  ampicillin  delavirdine  erlotinib  iron salts  medicines for fungal infections like ketoconazole,  itraconazole and voriconazole  methotrexate  mycophenolate mofetil  warfarin This list may not describe all possible interactions. Give your health care provider a list of all the medicines, herbs, non-prescription drugs, or dietary supplements you use. Also tell them if you smoke, drink alcohol, or use illegal drugs. Some items may interact with your medicine. What should I watch for while using this medicine? It can take several days before your stomach pain gets better. Check with your doctor or health care provider if your condition does not start to get better, or if it gets worse. This medicine may cause serious skin reactions. They can happen weeks to months after starting the medicine. Contact your health care provider right away if you notice fevers or flu-like symptoms with a rash. The rash may be red or purple and then turn into blisters or peeling of the skin. Or, you might notice a red rash with swelling of the face, lips or lymph nodes in your neck or under your arms. You may need blood work done while you are taking this medicine. This medicine may cause a decrease in vitamin B12. You should make sure that you get enough vitamin B12 while you are taking this medicine. Discuss the foods you eat and the vitamins you take with your health care provider. What side effects may I notice from receiving this medicine? Side effects that you should report to your doctor or health care professional as soon as possible:   allergic reactions like skin rash, itching or hives, swelling of the face, lips, or tongue   bone, muscle or joint pain   breathing problems  chest pain or chest tightness   dark yellow or brown urine   dizziness   fast, irregular heartbeat   feeling faint or lightheaded   fever or sore throat   muscle spasm   palpitations   rash on cheeks or arms that gets worse in the sun   redness, blistering, peeling or loosening of the skin, including  inside the mouth   seizures  stomach polyps   tremors   unusual bleeding or bruising   unusually weak or tired   yellowing of the eyes or skin Side effects that usually do not require medical attention (report to your doctor or health care professional if they continue or are bothersome):   constipation   diarrhea   dry mouth   headache   nausea This list may not describe all possible side effects. Call your doctor for medical advice about side effects. You may report side effects to FDA at 1-800-FDA-1088. Where should I keep my medicine? Keep out of the reach of children. Store at room temperature between 15 and 30 degrees C (59 and 86 degrees F). Protect from light and moisture. Throw away any unused medicine after the expiration date. NOTE: This sheet is a summary. It may not cover all possible information. If you have questions about this medicine, talk to your doctor, pharmacist, or health care provider.  2021 Elsevier/Gold Standard (2018-11-13 13:18:32) Food Choices for Gastroesophageal Reflux Disease, Adult When you have gastroesophageal reflux disease (GERD), the foods you eat and your eating habits are very important. Choosing the right foods can help ease your discomfort. Think about working with a food expert (dietitian) to help you make good choices. What are tips for following this plan? Reading food labels  Look for foods that are low in saturated fat. Foods that may help with your symptoms include: ? Foods that have less than 5% of daily value (DV) of fat. ? Foods that have 0 grams of trans fat. Cooking  Do not fry your food.  Cook your food by baking, steaming, grilling, or broiling. These are all methods that do not need a lot of fat for cooking.  To add flavor, try to use herbs that are low in spice and acidity. Meal planning  Choose healthy foods that are low in fat, such as: ? Fruits and vegetables. ? Whole grains. ? Low-fat dairy  products. ? Lean meats, fish, and poultry.  Eat small meals often instead of eating 3 large meals each day. Eat your meals slowly in a place where you are relaxed. Avoid bending over or lying down until 2-3 hours after eating.  Limit high-fat foods such as fatty meats or fried foods.  Limit your intake of fatty foods, such as oils, butter, and shortening.  Avoid the following as told by your doctor: ? Foods that cause symptoms. These may be different for different people. Keep a food diary to keep track of foods that cause symptoms. ? Alcohol. ? Drinking a lot of liquid with meals. ? Eating meals during the 2-3 hours before bed.   Lifestyle  Stay at a healthy weight. Ask your doctor what weight is healthy for you. If you need to lose weight, work with your doctor to do so safely.  Exercise for at least 30 minutes on 5 or more days each week, or as told by your doctor.  Wear loose-fitting clothes.  Do not smoke or use any products that contain nicotine or tobacco. If you need help quitting, ask  your doctor.  Sleep with the head of your bed higher than your feet. Use a wedge under the mattress or blocks under the bed frame to raise the head of the bed.  Chew sugar-free gum after meals. What foods should eat? Eat a healthy, well-balanced diet of fruits, vegetables, whole grains, low-fat dairy products, lean meats, fish, and poultry. Each person is different. Foods that may cause symptoms in one person may not cause any symptoms in another person. Work with your doctor to find foods that are safe for you. The items listed above may not be a complete list of what you can eat and drink. Contact a food expert for more options.   What foods should I avoid? Limiting some of these foods may help in managing the symptoms of GERD. Everyone is different. Talk with a food expert or your doctor to help you find the exact foods to avoid, if any. Fruits Any fruits prepared with added fat. Any fruits  that cause symptoms. For some people, this may include citrus fruits, such as oranges, grapefruit, pineapple, and lemons. Vegetables Deep-fried vegetables. [place] fries. Any vegetables prepared with added fat. Any vegetables that cause symptoms. For some people, this may include tomatoes and tomato products, chili peppers, onions and garlic, and horseradish. Grains Pastries or quick breads with added fat. Meats and other proteins High-fat meats, such as fatty beef or pork, hot dogs, ribs, ham, sausage, salami, and bacon. Fried meat or protein, including fried fish and fried chicken. Nuts and nut butters, in large amounts. Dairy Whole milk and chocolate milk. Sour cream. Cream. Ice cream. Cream cheese. Milkshakes. Fats and oils Butter. Margarine. Shortening. Ghee. Beverages Coffee and tea, with or without caffeine. Carbonated beverages. Sodas. Energy drinks. Fruit juice made with acidic fruits, such as orange or grapefruit. Tomato juice. Alcoholic drinks. Sweets and desserts Chocolate and cocoa. Donuts. Seasonings and condiments Pepper. Peppermint and spearmint. Added salt. Any condiments, herbs, or seasonings that cause symptoms. For some people, this may include curry, hot sauce, or vinegar-based salad dressings. The items listed above may not be a complete list of what you should not eat and drink. Contact a food expert for more options. Questions to ask your doctor Diet and lifestyle changes are often the first steps that are taken to manage symptoms of GERD. If diet and lifestyle changes do not help, talk with your doctor about taking medicines. Where to find more information  International Foundation for Gastrointestinal Disorders: aboutgerd.org Summary  When you have GERD, food and lifestyle choices are very important in easing your symptoms.  Eat small meals often instead of 3 large meals a day. Eat your meals slowly and in a place where you are relaxed.  Avoid bending over or  lying down until 2-3 hours after eating.  Limit high-fat foods such as fatty meats or fried foods. This information is not intended to replace advice given to you by your health care provider. Make sure you discuss any questions you have with your health care provider. Document Revised: 02/14/2020 Document Reviewed: 02/14/2020 Elsevier Patient Education  2021 [employer].

## 2020-11-16 NOTE — Progress Notes (Signed)
MyChart Video Visit    Virtual Visit via Video Note   This visit type was conducted due to national recommendations for restrictions regarding the COVID-19 Pandemic (e.g. social distancing) in an effort to limit this patient's exposure and mitigate transmission in our community. This patient is at least at moderate risk for complications without adequate follow up. This format is felt to be most appropriate for this patient at this time. Physical exam was limited by quality of the video and audio technology used for the visit.  Parties involved in visit as below:   Patient location: at home  Provider location: Provider: Provider's office at  [facility], [place].     I discussed the limitations of evaluation and management by telemedicine and the availability of in person appointments. The patient expressed understanding and agreed to proceed.  [name]   DOB: 05/13/1981   40 y.o. Female  MRN: [id number] Visit Date: 11/16/2020  Today's healthcare provider: Jairo Ben, FNP   Chief Complaint  Patient presents with  . GI Problem   Subjective    HPI  Pt c/o of nausea, burning feeling in stomach, vomiting and a decreased appetite for the last 8 days.  Has bloating and gas. Bowel movements were very norma, prior and has had constipation. She has had some nausea. Prior to this onset she was fine. Denies bleeding or tarry or mucous. Has mild reflux with eating since this onset. p   Pt's husband had H. Pylori had endoscopy.  had it a few months ago February. He finished his round of antibiotic.   Pt would like H. Pylori test.  Patient  denies any fever, body aches,chills, rash, chest pain, shortness of breath, vomiting, or diarrhea.  Denies dizziness, lightheadedness, pre syncopal or syncopal episodes.     Medications: Outpatient Medications Prior to Visit  Medication Sig  . hydrOXYzine (ATARAX/VISTARIL) 10 MG tablet TAKE 1 TABLET  BY MOUTH THREE TIMES A DAY AS NEEDED (Patient not taking: Reported on 11/16/2020)  . MYRBETRIQ 25 MG TB24 tablet Take 1 tablet (25 mg total) by mouth daily. (Patient not taking: Reported on 11/16/2020)  . [DISCONTINUED] chlorpheniramine-HYDROcodone (TUSSIONEX PENNKINETIC ER) 10-8 MG/5ML SUER Take 5 mLs by mouth every 12 (twelve) hours as needed for cough. (Patient not taking: Reported on 11/16/2020)  . [DISCONTINUED] predniSONE (STERAPRED UNI-PAK 21 TAB) 10 MG (21) TBPK tablet 6 day taper; take as directed on package instructions (Patient not taking: No sig reported)   No facility-administered medications prior to visit.    Review of Systems  Constitutional: Positive for appetite change and fatigue.  Gastrointestinal: Positive for abdominal distention, abdominal pain, nausea and vomiting.    Last CBC Lab Results  Component Value Date   WBC 5.8 02/17/2020   HGB 12.7 02/17/2020   HCT 38.6 02/17/2020   MCV 94 02/17/2020   MCH 31.0 02/17/2020   RDW 12.1 02/17/2020   PLT 288 02/17/2020   Last metabolic panel Lab Results  Component Value Date   GLUCOSE 87 02/17/2020   NA 138 02/17/2020   K 4.1 02/17/2020   CL 104 02/17/2020   CO2 22 02/17/2020   BUN 12 02/17/2020   CREATININE 0.64 02/17/2020   GFRNONAA 113 02/17/2020   GFRAA 130 02/17/2020   CALCIUM 9.0 02/17/2020   PROT 6.4 02/17/2020   ALBUMIN 4.0 02/17/2020   LABGLOB 2.4 02/17/2020   AGRATIO 1.7 02/17/2020   BILITOT 0.3 02/17/2020   ALKPHOS 59 02/17/2020  AST 16 02/17/2020   ALT 11 02/17/2020   Last lipids Lab Results  Component Value Date   CHOL 192 02/17/2020   HDL 50 02/17/2020   LDLCALC 127 (H) 02/17/2020   TRIG 84 02/17/2020   CHOLHDL 3.8 02/22/2019   Last hemoglobin A1c Lab Results  Component Value Date   HGBA1C 5.6 02/22/2019   Last thyroid functions Lab Results  Component Value Date   TSH 1.610 02/17/2020   T4TOTAL 8.1 11/04/2019   Last vitamin D Lab Results  Component Value Date   VD25OH 25.6  (L) 02/17/2020   Last vitamin B12 and Folate Lab Results  Component Value Date   VITAMINB12 324 11/04/2019   FOLATE 2.9 (L) 11/04/2019      Objective    There were no vitals taken for this visit. BP Readings from Last 3 Encounters:  06/06/20 124/83  02/17/20 118/77  12/21/19 136/78   Wt Readings from Last 3 Encounters:  06/06/20 134 lb 6.4 oz (61 kg)  02/17/20 139 lb (63 kg)  05/31/19 158 lb 9.6 oz (71.9 kg)      Physical Exam     Patient is alert and oriented and responsive to questions Engages in conversation with provider. Speaks in full sentences without any pauses without any shortness of breath or distress.    Assessment & Plan     Gastroesophageal reflux disease, unspecified whether esophagitis present - Plan: CBC with Differential/Platelet, Comprehensive Metabolic Panel (CMET), H. pylori breath test, Helicobacter pylori abs-IgG+IgA, bld, pantoprazole (PROTONIX) 40 MG tablet  Nausea - Plan: CBC with Differential/Platelet, Comprehensive Metabolic Panel (CMET), H. pylori breath test, Helicobacter pylori abs-IgG+IgA, bld, pantoprazole (PROTONIX) 40 MG tablet  Meds ordered this encounter  Medications  . pantoprazole (PROTONIX) 40 MG tablet    Sig: Take 1 tablet (40 mg total) by mouth daily.    Dispense:  30 tablet    Refill:  0  husband with H Pylori. Will test. Offered gastroenterology she will hold off until after labs given this is new. 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    Red Flags discussed. The patient was given clear instructions to go to ER or return to medical center if any red flags develop, symptoms do not improve, worsen or new problems develop. They verbalized understanding.  Return in about 2 weeks (around 11/30/2020), or if symptoms worsen or fail to improve, for at any time for any worsening symptoms, Go to Emergency room/ urgent care if worse.     I discussed the assessment and treatment plan with the patient. The  patient was provided an opportunity to ask questions and all were answered. The patient agreed with the plan and demonstrated an understanding of the instructions.   The patient was advised to call back or seek an in-person evaluation if the symptoms worsen or if the condition fails to improve as anticipated.   Red Flags discussed. The patient was given clear instructions to go to ER or return to medical center if any red flags develop, symptoms do not improve, worsen or new problems develop. They verbalized understanding.   I provided 30 minutes of non-face-to-face time during this encounter.  The entirety of the information documented in the History of Present Illness, Review of Systems and Physical Exam were personally obtained by me. Portions of this information were initially documented by the CMA and reviewed by me for thoroughness and accuracy.    Jairo Ben, FNP [facility] [phone]  phone) [phone] (fax)  [place]

## 2020-11-19 ENCOUNTER — Encounter: Payer: Self-pay | Admitting: Adult Health

## 2020-11-19 DIAGNOSIS — R110 Nausea: Secondary | ICD-10-CM | POA: Insufficient documentation

## 2020-11-19 DIAGNOSIS — K219 Gastro-esophageal reflux disease without esophagitis: Secondary | ICD-10-CM | POA: Insufficient documentation

## 2020-11-22 LAB — H. PYLORI BREATH TEST: H pylori Breath Test: NEGATIVE

## 2020-11-23 LAB — COMPREHENSIVE METABOLIC PANEL
ALT: 11 IU/L (ref 0–32)
AST: 16 IU/L (ref 0–40)
Albumin/Globulin Ratio: 1.6 (ref 1.2–2.2)
Albumin: 4.2 g/dL (ref 3.8–4.8)
Alkaline Phosphatase: 60 IU/L (ref 44–121)
BUN/Creatinine Ratio: 19 (ref 9–23)
BUN: 13 mg/dL (ref 6–24)
Bilirubin Total: 0.2 mg/dL (ref 0.0–1.2)
CO2: 21 mmol/L (ref 20–29)
Calcium: 9 mg/dL (ref 8.7–10.2)
Chloride: 101 mmol/L (ref 96–106)
Creatinine, Ser: 0.69 mg/dL (ref 0.57–1.00)
Globulin, Total: 2.6 g/dL (ref 1.5–4.5)
Glucose: 84 mg/dL (ref 65–99)
Potassium: 4.2 mmol/L (ref 3.5–5.2)
Sodium: 136 mmol/L (ref 134–144)
Total Protein: 6.8 g/dL (ref 6.0–8.5)
eGFR: 112 mL/min/{1.73_m2} (ref >59)

## 2020-11-23 LAB — CBC WITH DIFFERENTIAL/PLATELET
Basophils Absolute: 0 10*3/uL (ref 0.0–0.2)
Basos: 1 %
EOS (ABSOLUTE): 0 10*3/uL (ref 0.0–0.4)
Eos: 1 %
Hematocrit: 41.7 % (ref 34.0–46.6)
Hemoglobin: 13.9 g/dL (ref 11.1–15.9)
Immature Grans (Abs): 0 10*3/uL (ref 0.0–0.1)
Immature Granulocytes: 0 %
Lymphocytes Absolute: 2.5 10*3/uL (ref 0.7–3.1)
Lymphs: 35 %
MCH: 30.8 pg (ref 26.6–33.0)
MCHC: 33.3 g/dL (ref 31.5–35.7)
MCV: 92 fL (ref 79–97)
Monocytes Absolute: 0.6 10*3/uL (ref 0.1–0.9)
Monocytes: 8 %
Neutrophils Absolute: 3.9 10*3/uL (ref 1.4–7.0)
Neutrophils: 55 %
Platelets: 269 10*3/uL (ref 150–450)
RBC: 4.52 x10E6/uL (ref 3.77–5.28)
RDW: 12.3 % (ref 11.7–15.4)
WBC: 7 10*3/uL (ref 3.4–10.8)

## 2020-11-23 LAB — HELICOBACTER PYLORI ABS-IGG+IGA, BLD
H. pylori, IgA Abs: <9 units (ref 0.0–8.9)
H. pylori, IgG AbS: 0.36 Index Value (ref 0.00–0.79)

## 2020-11-24 NOTE — Progress Notes (Signed)
H. pylori test was negative.  CBC within normal limits.  CMP within normal limits.  Antibodies for H. pylori were negative as well for present and past infection.  If her symptoms are continuing to persist would recommend a referral to a gastroenterologist may refer to location of choice.

## 2020-12-14 ENCOUNTER — Other Ambulatory Visit: Payer: Self-pay | Admitting: Physician Assistant

## 2020-12-14 DIAGNOSIS — F418 Other specified anxiety disorders: Secondary | ICD-10-CM

## 2020-12-14 NOTE — Telephone Encounter (Signed)
  Notes to clinic: Patient was due to follow up on the dose to see if it was working Review for refill    Requested Prescriptions  Pending Prescriptions Disp Refills   hydrOXYzine (ATARAX/VISTARIL) 10 MG tablet [Pharmacy Med Name: HYDROXYZINE HCL 10 MG TABLET] 30 tablet 0    Sig: TAKE 1 TABLET BY MOUTH THREE TIMES A DAY AS NEEDED      Ear, Nose, and Throat:  Antihistamines Passed - 12/14/2020  1:44 PM      Passed - Valid encounter within last 12 months    Recent Outpatient Visits           4 weeks ago Gastroesophageal reflux disease, unspecified whether esophagitis present   [facility] Flinch[place], Eula Fried, FNP   8 months ago Sore throat   [facility] Joycelyn Man M, [place]   10 months ago Annual physical exam   [facility] Joycelyn Man M, [place]   11 months ago Adjustment disorder with anxiety   [facility] Osvaldo Angst M, [place]   11 months ago Acute adjustment disorder with anxiety   [facility] [place], [place], [place]

## 2020-12-16 ENCOUNTER — Other Ambulatory Visit: Payer: Self-pay | Admitting: Adult Health

## 2020-12-16 DIAGNOSIS — R110 Nausea: Secondary | ICD-10-CM

## 2020-12-16 DIAGNOSIS — K219 Gastro-esophageal reflux disease without esophagitis: Secondary | ICD-10-CM

## 2020-12-29 ENCOUNTER — Ambulatory Visit: Payer: Self-pay

## 2020-12-29 NOTE — Telephone Encounter (Signed)
Can you work Ms. Husak in?  Thanks,   -Vernona Rieger

## 2020-12-29 NOTE — Telephone Encounter (Signed)
Pt states she is shaking and having anxiety. Would like to speak with a nurse Pt. States she is separated from her husband and is having anxiety. Feels shaky. Wakes up in the middle of the night and can't go back to sleep.Taking Atarax, not helping. Does see a therapist.Pt. Declines to see [employer]. Would like to see another provider. Please advise. Reason for Disposition . [1] Started on anti-anxiety medication AND [2] no relief  Answer Assessment - Initial Assessment Questions 1. CONCERN: "Did anything happen that prompted you to call today?"      aNXIETY 2. ANXIETY SYMPTOMS: "Can you describe how you (your loved one; patient) have been feeling?" (e.g., tense, restless, panicky, anxious, keyed up, overwhelmed, sense of impending doom).      Separated from husband 3. ONSET: "How long have you been feeling this way?" (e.g., hours, days, weeks)     1 month ago 4. SEVERITY: "How would you rate the level of anxiety?" (e.g., 0 - 10; or mild, moderate, severe).     Moderate 5. FUNCTIONAL IMPAIRMENT: "How have these feelings affected your ability to do daily activities?" "Have you had more difficulty than usual doing your normal daily activities?" (e.g., getting better, same, worse; self-care, school, work, interactions)     Worse 6. HISTORY: "Have you felt this way before?" "Have you ever been diagnosed with an anxiety problem in the past?" (e.g., generalized anxiety disorder, panic attacks, PTSD). If Yes, ask: "How was this problem treated?" (e.g., medicines, counseling, etc.)     Yes 7. RISK OF HARM - SUICIDAL IDEATION: "Do you ever have thoughts of hurting or killing yourself?" If Yes, ask:  "Do you have these feelings now?" "Do you have a plan on how you would do this?"     No 8. TREATMENT:  "What has been done so far to treat this anxiety?" (e.g., medicines, relaxation strategies). "What has helped?"     Atarax 9. TREATMENT - THERAPIST: "Do you have a counselor or therapist? Name?"      Yes 10. POTENTIAL TRIGGERS: "Do you drink caffeinated beverages (e.g., coffee, colas, teas), and how much daily?" "Do you drink alcohol or use any drugs?" "Have you started any new medicines recently?"     No 10. PATIENT SUPPORT: "Who is with you now?" "Who do you live with?" "Do you have family or friends who you can talk to?"        Yes 11. OTHER SYMPTOMS: "Do you have any other symptoms?" (e.g., feeling depressed, trouble concentrating, trouble sleeping, trouble breathing, palpitations or fast heartbeat, chest pain, sweating, nausea, or diarrhea)       Feels shaky 12. PREGNANCY: "Is there any chance you are pregnant?" "When was your last menstrual period?"       No  Protocols used: ANXIETY AND PANIC ATTACK-A-AH

## 2020-12-29 NOTE — Telephone Encounter (Signed)
Spoke with patient on the phone she states that anxiety is new onset and just separated from her husband a week ago and is having a hard time adjusting to seperation. I offered patient appointment with Dortha Kern as he is the only provider at this time with openings next week, she declined stating that her and Maurine Minister had differences of opinions last time she saw him. Patient is asking to see you is possible she is wanting to know if you can squeeze her into your schedule Monday morning anywhere between 11-12, she states that she can do between 8-10 but would need to be contacted early to make sure she is available. Please advise. KW

## 2020-12-29 NOTE — Telephone Encounter (Signed)
Ok to see any provider with openings.

## 2021-01-01 ENCOUNTER — Telehealth: Payer: Managed Care, Other (non HMO) | Admitting: Family Medicine

## 2021-01-01 NOTE — Telephone Encounter (Signed)
Noted  

## 2021-01-01 NOTE — Telephone Encounter (Signed)
Left message for patient to call back, [facility] can schedule with Dr. Sullivan Lone this week as he has openings if patient is willing to see another provider. KW

## 2021-01-01 NOTE — Telephone Encounter (Signed)
Unfortunately, I am booked. Could see if [place] or [place] family could see her for acute issue or if another provider has openings. If something opens up, we can use that.

## 2021-01-01 NOTE — Telephone Encounter (Signed)
Patient called and given message below from Dr. Beryle Flock about scheduling with another provider due to no openings. Patient declines and says that's ok for now.

## 2021-01-02 ENCOUNTER — Ambulatory Visit (INDEPENDENT_AMBULATORY_CARE_PROVIDER_SITE_OTHER): Payer: Managed Care, Other (non HMO) | Admitting: *Deleted

## 2021-01-02 ENCOUNTER — Telehealth: Payer: Self-pay

## 2021-01-02 ENCOUNTER — Other Ambulatory Visit: Payer: Self-pay

## 2021-01-02 VITALS — BP 114/80 | HR 69

## 2021-01-02 DIAGNOSIS — Z113 Encounter for screening for infections with a predominantly sexual mode of transmission: Secondary | ICD-10-CM

## 2021-01-02 NOTE — Progress Notes (Signed)
SUBJECTIVE:  40 y.o. female here requesting full STI screen.  Does have some vaginal spotting, denies significant pelvic pain or fever. No UTI symptoms. Denies history of known exposure to STD.  No LMP recorded.  OBJECTIVE:  She appears well, afebrile. Urine dipstick: not done.  ASSESSMENT:   STI screen   PLAN:  GC, chlamydia, trichomonas, probe sent to lab. Lab work collected. Treatment: To be determined once lab results are received ROV prn if symptoms persist or worsen.

## 2021-01-02 NOTE — Progress Notes (Signed)
Patient was assessed and managed by nursing staff during this encounter. I have reviewed the chart and agree with the documentation and plan.   Jaynie Collins, MD 01/02/2021 12:34 PM

## 2021-01-02 NOTE — Telephone Encounter (Signed)
Pt called stating she has noticed spotting and her cycle ended a week ago. Pt requesting self swab, pt scheduled for today at 10:30am

## 2021-01-03 LAB — CHLAMYDIA/GONOCOCCUS/TRICHOMONAS, NAA
Chlamydia by NAA: NEGATIVE
Gonococcus by NAA: NEGATIVE
Trich vag by NAA: NEGATIVE

## 2021-01-03 LAB — HIV ANTIBODY (ROUTINE TESTING W REFLEX): HIV Screen 4th Generation wRfx: NONREACTIVE

## 2021-01-03 LAB — HEPATITIS C ANTIBODY: Hep C Virus Ab: <0.1 s/co ratio (ref 0.0–0.9)

## 2021-01-03 LAB — RPR: RPR Ser Ql: NONREACTIVE

## 2021-01-03 LAB — HEPATITIS B SURFACE ANTIGEN: Hepatitis B Surface Ag: NEGATIVE

## 2021-01-08 ENCOUNTER — Other Ambulatory Visit: Payer: Self-pay | Admitting: Family Medicine

## 2021-01-08 DIAGNOSIS — F418 Other specified anxiety disorders: Secondary | ICD-10-CM

## 2021-01-08 NOTE — Telephone Encounter (Signed)
Requested Prescriptions  Pending Prescriptions Disp Refills  . hydrOXYzine (ATARAX/VISTARIL) 10 MG tablet [Pharmacy Med Name: HYDROXYZINE HCL 10 MG TABLET] 90 tablet 0    Sig: TAKE 1 TABLET BY MOUTH THREE TIMES A DAY AS NEEDED     Ear, Nose, and Throat:  Antihistamines Passed - 01/08/2021  6:43 PM      Passed - Valid encounter within last 12 months    Recent Outpatient Visits          1 month ago Gastroesophageal reflux disease, unspecified whether esophagitis present   [facility] Flinchum, Eula Fried, FNP   9 months ago Sore throat   [facility] Joycelyn Man M, [place]   10 months ago Annual physical exam   [facility] Olivia, [place], [place]   1 year ago Adjustment disorder with anxiety   [facility] Osvaldo Angst M, PA-C   1 year ago Acute adjustment disorder with anxiety   [facility] Trey Sailors, PA-C      Future Appointments            In 3 days Reva Bores, MD Center for [employer] at [facility], [facility]

## 2021-01-10 ENCOUNTER — Other Ambulatory Visit: Payer: Self-pay | Admitting: Family Medicine

## 2021-01-10 DIAGNOSIS — K219 Gastro-esophageal reflux disease without esophagitis: Secondary | ICD-10-CM

## 2021-01-10 DIAGNOSIS — R110 Nausea: Secondary | ICD-10-CM

## 2021-01-11 ENCOUNTER — Telehealth (INDEPENDENT_AMBULATORY_CARE_PROVIDER_SITE_OTHER): Payer: Managed Care, Other (non HMO) | Admitting: Family Medicine

## 2021-01-11 ENCOUNTER — Encounter: Payer: Self-pay | Admitting: Family Medicine

## 2021-01-11 ENCOUNTER — Other Ambulatory Visit: Payer: Self-pay

## 2021-01-11 DIAGNOSIS — F419 Anxiety disorder, unspecified: Secondary | ICD-10-CM | POA: Diagnosis not present

## 2021-01-11 MED ORDER — BUSPIRONE HCL 7.5 MG PO TABS
7.5000 mg | ORAL_TABLET | Freq: Three times a day (TID) | ORAL | 2 refills | Status: DC
Start: 2021-01-11 — End: 2021-02-02

## 2021-01-11 NOTE — Assessment & Plan Note (Signed)
Add buspar, may take with vistaril. Declines SSRI

## 2021-01-11 NOTE — Progress Notes (Signed)
GYNECOLOGY VIRTUAL VISIT ENCOUNTER NOTE  Provider location: Center for [facility] at [facility]   Patient location: Home  I connected with Victoria [name] on 01/11/21 at  8:15 AM EDT by MyChart Video Encounter and verified that I am speaking with the correct person using two identifiers.   I discussed the limitations, risks, security and privacy concerns of performing an evaluation and management service virtually and the availability of in person appointments. I also discussed with the patient that there may be a patient responsible charge related to this service. The patient expressed understanding and agreed to proceed.   History:  Victoria [name] is a 40 y.o. G25P2002 female being evaluated today for anxiety. She and [name] husband have separated. Feels like she is vibrating on the inside and is shaky. She cannot eat or sleep. She is quite sad. Feels like something is sitting on [name] chest.  He is having some substance use disorder and is bipolar. She has a Veterinary surgeon. Had a second period this month. She denies any abnormal vaginal discharge, bleeding, pelvic pain or other concerns.       Past Medical History:  Diagnosis Date  . Abnormal Pap smear of cervix   . Calculus of kidney    left distal ureteral stone  . Dyspareunia    Past Surgical History:  Procedure Laterality Date  . CRYOTHERAPY     AGE 72  . LITHOTRIPSY    . TUBAL LIGATION     The following portions of the patient's history were reviewed and updated as appropriate: allergies, current medications, past family history, past medical history, past social history, past surgical history and problem list.   Health Maintenance:  Normal pap and negative HRHPV on 06/06/2020.  Normal mammogram on 06/20/2020.   Review of Systems:  Pertinent items noted in HPI and remainder of comprehensive ROS otherwise negative.  Physical Exam:   General:  Alert, oriented and cooperative. Patient appears to be in no acute  distress.  Mental Status: Normal mood and affect. Normal behavior. Normal judgment and thought content.   Respiratory: Normal respiratory effort, no problems with respiration noted  Rest of physical exam deferred due to type of encounter  Labs and Imaging Results for orders placed or performed in visit on 01/02/21 (from the past 336 hour(s))  Chlamydia/Gonococcus/Trichomonas, NAA   Collection Time: 01/02/21  1:00 PM   Specimen: Vaginal; Genital   VA  Result Value Ref Range   Chlamydia by NAA Negative Negative   Gonococcus by NAA Negative Negative   Trich vag by NAA Negative Negative  Hepatitis B surface antigen   Collection Time: 01/02/21  1:03 PM  Result Value Ref Range   Hepatitis B Surface Ag Negative Negative  Hepatitis C antibody   Collection Time: 01/02/21  1:03 PM  Result Value Ref Range   Hep C Virus Ab <0.1 0.0 - 0.9 s/co ratio  HIV Antibody (routine testing w rflx)   Collection Time: 01/02/21  1:03 PM  Result Value Ref Range   HIV Screen 4th Generation wRfx Non Reactive Non Reactive  RPR   Collection Time: 01/02/21  1:03 PM  Result Value Ref Range   RPR Ser Ql Non Reactive Non Reactive   No results found.     Assessment and Plan:     Problem List Items Addressed This Visit      Unprioritized   Anxiety - Primary    Add buspar, may take with vistaril. Declines SSRI  Relevant Medications   busPIRone (BUSPAR) 7.5 MG tablet           I discussed the assessment and treatment plan with the patient. The patient was provided an opportunity to ask questions and all were answered. The patient agreed with the plan and demonstrated an understanding of the instructions.   The patient was advised to call back or seek an in-person evaluation/go to the ED if the symptoms worsen or if the condition fails to improve as anticipated.  I provided 14 minutes of face-to-face time during this encounter. Return in about 2 weeks (around 01/25/2021).   Reva Bores,  MD [facility] for [employer], [facility]

## 2021-02-01 ENCOUNTER — Other Ambulatory Visit: Payer: Self-pay | Admitting: Family Medicine

## 2021-02-01 ENCOUNTER — Telehealth: Payer: Managed Care, Other (non HMO) | Admitting: Family Medicine

## 2021-02-01 DIAGNOSIS — F418 Other specified anxiety disorders: Secondary | ICD-10-CM

## 2021-02-01 NOTE — Telephone Encounter (Signed)
Requested medication (s) are due for refill today:   No  Requested medication (s) are on the active medication list:   Yes  Future visit scheduled:   No   Last ordered: 01/08/2021 #90, 0 refills   Pharmacy requesting a 90 day supply reason returned.   Requested Prescriptions  Pending Prescriptions Disp Refills   hydrOXYzine (ATARAX/VISTARIL) 10 MG tablet [Pharmacy Med Name: HYDROXYZINE HCL 10 MG TABLET] 270 tablet 1    Sig: TAKE 1 TABLET BY MOUTH THREE TIMES A DAY AS NEEDED      Ear, Nose, and Throat:  Antihistamines Passed - 02/01/2021  8:33 AM      Passed - Valid encounter within last 12 months    Recent Outpatient Visits           2 months ago Gastroesophageal reflux disease, unspecified whether esophagitis present   [facility] Flinchum, Eula Fried, FNP   10 months ago Sore throat   [facility] Joycelyn Man M, [place]   11 months ago Annual physical exam   [facility] [place], [place], [place]   1 year ago Adjustment disorder with anxiety   [facility] [place], Ricki Rodriguez M, PA-C   1 year ago Acute adjustment disorder with anxiety   [facility], Lavella [place], [place]

## 2021-02-02 ENCOUNTER — Other Ambulatory Visit: Payer: Self-pay | Admitting: Family Medicine

## 2021-02-02 DIAGNOSIS — F419 Anxiety disorder, unspecified: Secondary | ICD-10-CM

## 2021-02-22 ENCOUNTER — Telehealth: Payer: Self-pay

## 2021-02-22 NOTE — Telephone Encounter (Signed)
Copied from CRM [id number]. Topic: Appointment Scheduling - Scheduling Inquiry for Clinic >> Feb 22, 2021 12:01 PM Elliot Gault wrote: Reason for CRM patient would like to [facility] her 03/14/2021 physical appointment, please reach out

## 2021-03-08 ENCOUNTER — Telehealth: Payer: Self-pay

## 2021-03-08 NOTE — Telephone Encounter (Signed)
Copied from CRM [id number]. Topic: Appointment Scheduling - Scheduling Inquiry for Clinic >> Mar 07, 2021  4:50 PM Marylen Ponto wrote: Reason for CRM: Pt requests to reschedule annual physical scheduled for 04/05/21 with Merita Norton. Cb# [phone]

## 2021-03-14 ENCOUNTER — Encounter: Payer: Managed Care, Other (non HMO) | Admitting: Family Medicine

## 2021-03-15 ENCOUNTER — Ambulatory Visit: Payer: Managed Care, Other (non HMO) | Admitting: Obstetrics and Gynecology

## 2021-04-05 ENCOUNTER — Encounter: Payer: Managed Care, Other (non HMO) | Admitting: Family Medicine

## 2021-04-10 ENCOUNTER — Encounter: Payer: Self-pay | Admitting: Family Medicine

## 2021-04-10 ENCOUNTER — Telehealth: Payer: Self-pay | Admitting: Family Medicine

## 2021-04-10 DIAGNOSIS — Z113 Encounter for screening for infections with a predominantly sexual mode of transmission: Secondary | ICD-10-CM

## 2021-04-10 NOTE — Telephone Encounter (Signed)
Patient reports husband was seen today at [facility] ER. Patient reports her husband was tested for STD's. Patient denies any fever, abdominal pain, painful urination or rash. Please advise.

## 2021-04-10 NOTE — Telephone Encounter (Signed)
Pt states her ex husband has tested positive for  Epididymitis. His doctor suggested that she needs to be tested as well for this. Is this something her dr can order?

## 2021-04-24 NOTE — Progress Notes (Signed)
Complete physical exam   [name]   DOB: 03/09/1981   40 y.o. Female  MRN: [id number] Visit Date: 04/25/2021  Today's healthcare provider: Jacky Kindle, FNP   Annual exam  Subjective     HPI  Victoria [name] is a 40 y.o. female who presents today for a complete physical exam.  She reports consuming a general diet. Walks daily for at least 15-20 minutes. She generally feels fairly well. She reports sleeping poorly. She does have additional problems to discuss today.  Last Reported Pap -06/06/20 ASCUS, Negative HPV patient will go to OBGYN for paps Mammogram-06/20/20 was advised she would not need for another 3 years Patient has tried 10 mg melatonin to sleep at night and sometimes it works other times it does not Would like a urine specimen was advised by soon to be ex husband that she would need to be checked.  Past Medical History:  Diagnosis Date   Abnormal Pap smear of cervix    Calculus of kidney    left distal ureteral stone   Dyspareunia    Past Surgical History:  Procedure Laterality Date   CRYOTHERAPY     AGE 89   LITHOTRIPSY     TUBAL LIGATION     Social History   Socioeconomic History   Marital status: Legally Separated    Spouse name: Not on file   Number of children: 2   Years of education: Not on file   Highest education level: Not on file  Occupational History   Not on file  Tobacco Use   Smoking status: Some Days    Years: 6.00    Types: Cigarettes    Last attempt to quit: 08/19/2004    Years since quitting: 16.6   Smokeless tobacco: Never   Tobacco comments:    quit in 2004  Vaping Use   Vaping Use: Never used  Substance and Sexual Activity   Alcohol use: Yes    Alcohol/week: 0.0 standard drinks    Comment: OCCASIONALLY   Drug use: No   Sexual activity: Yes    Partners: Male    Birth control/protection: Surgical    Comment: tubaligation.  Other Topics Concern   Not on file  Social History Narrative   Not on file    Social Determinants of Health   Financial Resource Strain: Not on file  Food Insecurity: Not on file  Transportation Needs: Not on file  Physical Activity: Not on file  Stress: Not on file  Social Connections: Not on file  Intimate Partner Violence: Not on file   Family Status  Relation Name Status   Mat Aunt  Deceased   PGF  Deceased   PGM  Deceased   Brother  Alive   Mother  Alive   Father  Deceased   Brother  Alive   Daughter  Alive   Son  Alive   Family History  Problem Relation Age of Onset   Breast cancer Maternal Aunt        1/2    Cancer Maternal Aunt        breast   Heart attack Paternal Grandfather    Heart disease Paternal Grandfather        heart atack   Heart attack Paternal Grandmother    Heart disease Paternal Grandmother        heart attack   Hypertension Brother    Alcohol abuse Brother    Pancreatitis Brother  Alcohol abuse Father    Allergies  Allergen Reactions   Bactrim Other (See Comments)    Dizziness    Sulfa Antibiotics Other (See Comments)    dizziness    Patient Care Team: Reine Just as PCP - General (Family Medicine)   Medications: Outpatient Medications Prior to Visit  Medication Sig   busPIRone (BUSPAR) 7.5 MG tablet TAKE 1 TABLET BY MOUTH 3 TIMES DAILY.   [DISCONTINUED] hydrOXYzine (ATARAX/VISTARIL) 10 MG tablet TAKE 1 TABLET BY MOUTH THREE TIMES A DAY AS NEEDED (Patient taking differently: 3 (three) times daily.)   No facility-administered medications prior to visit.    Review of Systems  Gastrointestinal:  Positive for abdominal distention, abdominal pain and constipation.  Psychiatric/Behavioral:  Positive for sleep disturbance. The patient is nervous/anxious.   All other systems reviewed and are negative.    Objective    BP 105/68 (BP Location: Left Arm, Patient Position: Sitting, Cuff Size: Normal)   Pulse 90   Temp 98.6 F (37 C) (Oral)   Ht 5' 6.5" (1.689 m)   Wt 141 lb 6.4 oz (64.1 kg)    LMP 04/15/2021 Comment: 4-5 days  SpO2 95%   BMI 22.48 kg/m    Physical Exam Vitals and nursing note reviewed.  Constitutional:      General: She is awake. She is not in acute distress.    Appearance: Normal appearance. She is well-developed, well-groomed and normal weight. She is not ill-appearing, toxic-appearing or diaphoretic.  HENT:     Head: Normocephalic and atraumatic.     Jaw: There is normal jaw occlusion. No trismus, tenderness, swelling or pain on movement.     Right Ear: Hearing, tympanic membrane, ear canal and external ear normal. There is no impacted cerumen.     Left Ear: Hearing, tympanic membrane, ear canal and external ear normal. There is no impacted cerumen.     Nose: Nose normal. No congestion or rhinorrhea.     Right Turbinates: Not enlarged, swollen or pale.     Left Turbinates: Not enlarged, swollen or pale.     Right Sinus: No maxillary sinus tenderness or frontal sinus tenderness.     Left Sinus: No maxillary sinus tenderness or frontal sinus tenderness.     Mouth/Throat:     Lips: Pink.     Mouth: Mucous membranes are moist. No injury.     Tongue: No lesions.     Pharynx: Oropharynx is clear. Uvula midline. No pharyngeal swelling, oropharyngeal exudate, posterior oropharyngeal erythema or uvula swelling.     Tonsils: No tonsillar exudate or tonsillar abscesses.  Eyes:     General: Lids are normal. Lids are everted, no foreign bodies appreciated. Vision grossly intact. Gaze aligned appropriately. No allergic shiner or visual field deficit.       Right eye: No discharge.        Left eye: No discharge.     Extraocular Movements: Extraocular movements intact.     Conjunctiva/sclera: Conjunctivae normal.     Right eye: Right conjunctiva is not injected. No exudate.    Left eye: Left conjunctiva is not injected. No exudate.    Pupils: Pupils are equal, round, and reactive to light.  Neck:     Thyroid: No thyroid mass, thyromegaly or thyroid tenderness.      Vascular: No carotid bruit.     Trachea: Trachea normal.  Cardiovascular:     Rate and Rhythm: Normal rate and regular rhythm.     Pulses: Normal pulses.  Carotid pulses are 2+ on the right side and 2+ on the left side.      Radial pulses are 2+ on the right side and 2+ on the left side.       Dorsalis pedis pulses are 2+ on the right side and 2+ on the left side.       Posterior tibial pulses are 2+ on the right side and 2+ on the left side.     Heart sounds: Normal heart sounds, S1 normal and S2 normal. No murmur heard.   No friction rub. No gallop.  Pulmonary:     Effort: Pulmonary effort is normal. No respiratory distress.     Breath sounds: Normal breath sounds and air entry. No stridor. No wheezing, rhonchi or rales.  Chest:     Chest wall: No tenderness.     Comments: Breast exam deferred; discussed 'know your lemons' campaign and self exam Abdominal:     General: Abdomen is flat. Bowel sounds are normal. There is no distension.     Palpations: Abdomen is soft. There is no mass.     Tenderness: There is abdominal tenderness in the right upper quadrant, right lower quadrant and left lower quadrant. There is no right CVA tenderness, left CVA tenderness, guarding or rebound.     Hernia: No hernia is present.     Comments: Remote complaints of constipation- discussed use of osmolitic laxative in place of stimulant laxatives  Genitourinary:    Comments: Exam deferred; denies complaints Musculoskeletal:        General: No swelling, tenderness, deformity or signs of injury. Normal range of motion.     Cervical back: Full passive range of motion without pain, normal range of motion and neck supple. No edema, rigidity or tenderness. No muscular tenderness.     Right lower leg: No edema.     Left lower leg: No edema.  Lymphadenopathy:     Cervical: No cervical adenopathy.     Right cervical: No superficial, deep or posterior cervical adenopathy.    Left cervical: No  superficial, deep or posterior cervical adenopathy.  Skin:    General: Skin is warm and dry.     Capillary Refill: Capillary refill takes less than 2 seconds.     Coloration: Skin is not jaundiced or pale.     Findings: No bruising, erythema, lesion or rash.  Neurological:     General: No focal deficit present.     Mental Status: She is alert and oriented to person, place, and time. Mental status is at baseline.     GCS: GCS eye subscore is 4. GCS verbal subscore is 5. GCS motor subscore is 6.     Sensory: Sensation is intact. No sensory deficit.     Motor: Motor function is intact. No weakness.     Coordination: Coordination is intact. Coordination normal.     Gait: Gait is intact. Gait normal.  Psychiatric:        Attention and Perception: Attention and perception normal.        Mood and Affect: Mood and affect normal.        Speech: Speech normal.        Behavior: Behavior normal. Behavior is cooperative.        Thought Content: Thought content normal.        Cognition and Memory: Cognition and memory normal.        Judgment: Judgment normal.     Last depression screening scores PHQ 2/9  Scores 04/25/2021 02/17/2020 01/25/2019  PHQ - 2 Score 2 0 0  PHQ- 9 Score 7 1 0   Last fall risk screening Fall Risk  04/25/2021  Falls in the past year? 0  Number falls in past yr: 0  Injury with Fall? 0  Risk for fall due to : No Fall Risks  Follow up -   Last Audit-C alcohol use screening Alcohol Use Disorder Test (AUDIT) 04/25/2021  1. How often do you have a drink containing alcohol? 1  2. How many drinks containing alcohol do you have on a typical day when you are drinking? 0  3. How often do you have six or more drinks on one occasion? 0  AUDIT-C Score 1  Alcohol Brief Interventions/Follow-up -   A score of 3 or more in women, and 4 or more in men indicates increased risk for alcohol abuse, EXCEPT if all of the points are from question 1   No results found for any visits on 04/25/21.   Assessment & Plan    Routine Health Maintenance and Physical Exam  Exercise Activities and Dietary recommendations  Goals   None     Immunization History  Administered Date(s) Administered   Influenza Split 06/05/2010, 08/15/2011   Influenza,inj,Quad PF,6+ Mos 04/23/2017, 06/12/2018, 05/31/2019, 06/06/2020   Tdap 06/05/2010    Health Maintenance  Topic Date Due   COVID-19 Vaccine (1) Never done   Pneumococcal Vaccine 80-68 Years old (1 - PCV) Never done   TETANUS/TDAP  06/05/2020   INFLUENZA VACCINE  03/19/2021   PAP SMEAR-Modifier  06/07/2023   Hepatitis C Screening  Completed   HIV Screening  Completed   HPV VACCINES  Aged Out    Discussed health benefits of physical activity, and encouraged her to engage in regular exercise appropriate for her age and condition.  Problem List Items Addressed This Visit       Other   Anxiety    Using both Buspar and Atarax; continues to decline SSRI/SNRI Recommend counseling- patient has gone in past       Relevant Medications   traZODone (DESYREL) 50 MG tablet   Other Relevant Orders   CBC with Differential/Platelet   Comprehensive metabolic panel   Hemoglobin A1c   Lipid panel   Magnesium   T4, free   TSH   Vitamin B12   Vitamin D (25 hydroxy)   Routine screening for STI (sexually transmitted infection)    Recent exposure d/t partner- Routine testing completed Denies symptoms      Relevant Orders   Hepatitis C antibody   HIV antibody (with reflex)   RPR   Urine cytology ancillary only   Generalized abdominal pain    Report of late night RUQ abdominal pain Denies correlation with certain foods Has tried many OTC products without relief Recommend [place] given RLQ and LLQ pain upon exam as well      Relevant Orders   H. pylori antigen, stool   US Abdomen Complete   Encounter for screening mammogram for breast cancer    Due this fall for routine screening Reviewed know your lemons campaign      Relevant Orders    MM 3D SCREEN BREAST BILATERAL   Adjustment insomnia    Trial of trazodone Does not want to be on medication that is addicting or will be her 'in a different world' where she won't hear a phone call Reviewed ability to titrate medication to her desired need      Relevant  Medications   traZODone (DESYREL) 50 MG tablet   Constipation    Recent complaint Discussed use of PEG vs stimulant laxative Recommend increased fiber, water and exercise Can use PEG until stool is clear following [place] results      Encounter for preventive health examination - Primary    Things to do to keep yourself healthy  - Exercise at least 30-45 minutes a day, 3-4 days a week.  - Eat a low-fat diet with lots of fruits and vegetables, up to 7-9 servings per day.  - Seatbelts can save your life. Wear them always.  - Smoke detectors on every level of your home, check batteries every year.  - Eye Doctor - have an eye exam every 1-2 years  - Safe sex - if you may be exposed to STDs, use a condom.  - Alcohol -  If you drink, do it moderately, less than 2 drinks per day.  - Health Care Power of Attorney. Choose someone to speak for you if you are not able.  - Depression is common in our stressful world.If you're feeling down or losing interest in things you normally enjoy, please come in for a visit.  - Violence - If anyone is threatening or hurting you, please call immediately.           Return in about 1 year (around 04/25/2022) for annual examination.     Leilani Merl, FNP, have reviewed all documentation for this visit. The documentation on 04/25/21 for the exam, diagnosis, procedures, and orders are all accurate and complete.    Jacky Kindle, FNP  [facility] [phone] (phone) [phone] (fax)  [facility]

## 2021-04-25 ENCOUNTER — Other Ambulatory Visit (HOSPITAL_COMMUNITY)
Admission: RE | Admit: 2021-04-25 | Discharge: 2021-04-25 | Disposition: A | Discharge status: Home / Self Care | Payer: Managed Care, Other (non HMO) | Source: Ambulatory Visit | Attending: Family Medicine | Admitting: Family Medicine

## 2021-04-25 ENCOUNTER — Ambulatory Visit (INDEPENDENT_AMBULATORY_CARE_PROVIDER_SITE_OTHER): Payer: Managed Care, Other (non HMO) | Admitting: Family Medicine

## 2021-04-25 ENCOUNTER — Encounter: Payer: Self-pay | Admitting: Family Medicine

## 2021-04-25 ENCOUNTER — Other Ambulatory Visit: Payer: Self-pay

## 2021-04-25 VITALS — BP 105/68 | HR 90 | Temp 98.6°F | Ht 66.5 in | Wt 141.4 lb

## 2021-04-25 DIAGNOSIS — Z0000 Encounter for general adult medical examination without abnormal findings: Secondary | ICD-10-CM | POA: Diagnosis not present

## 2021-04-25 DIAGNOSIS — Z113 Encounter for screening for infections with a predominantly sexual mode of transmission: Secondary | ICD-10-CM | POA: Diagnosis not present

## 2021-04-25 DIAGNOSIS — R1084 Generalized abdominal pain: Secondary | ICD-10-CM | POA: Diagnosis not present

## 2021-04-25 DIAGNOSIS — F5102 Adjustment insomnia: Secondary | ICD-10-CM | POA: Diagnosis not present

## 2021-04-25 DIAGNOSIS — Z1231 Encounter for screening mammogram for malignant neoplasm of breast: Secondary | ICD-10-CM

## 2021-04-25 DIAGNOSIS — K5900 Constipation, unspecified: Secondary | ICD-10-CM | POA: Insufficient documentation

## 2021-04-25 DIAGNOSIS — F419 Anxiety disorder, unspecified: Secondary | ICD-10-CM

## 2021-04-25 MED ORDER — HYDROXYZINE HCL 10 MG PO TABS: 10.0000 mg | ORAL_TABLET | Freq: Four times a day (QID) | ORAL | 3 refills | Status: DC | PRN

## 2021-04-25 MED ORDER — TRAZODONE HCL 50 MG PO TABS: 25.0000 mg | ORAL_TABLET | Freq: Every evening | ORAL | 3 refills | Status: DC | PRN

## 2021-04-25 NOTE — Assessment & Plan Note (Signed)

## 2021-04-25 NOTE — Assessment & Plan Note (Signed)
Recent complaint Discussed use of PEG vs stimulant laxative Recommend increased fiber, water and exercise Can use PEG until stool is clear following [place] results

## 2021-04-25 NOTE — Assessment & Plan Note (Signed)
Report of late night RUQ abdominal pain Denies correlation with certain foods Has tried many OTC products without relief Recommend [place] given RLQ and LLQ pain upon exam as well

## 2021-04-25 NOTE — Assessment & Plan Note (Signed)
Due this fall for routine screening Reviewed know your lemons campaign

## 2021-04-25 NOTE — Assessment & Plan Note (Signed)
Recent exposure d/t partner- Routine testing completed Denies symptoms

## 2021-04-25 NOTE — Assessment & Plan Note (Signed)
Using both Buspar and Atarax; continues to decline SSRI/SNRI Recommend counseling- patient has gone in past

## 2021-04-25 NOTE — Assessment & Plan Note (Signed)
Trial of trazodone Does not want to be on medication that is addicting or will be her 'in a different world' where she won't hear a phone call Reviewed ability to titrate medication to her desired need

## 2021-04-26 LAB — CBC WITH DIFFERENTIAL/PLATELET
Basophils Absolute: 0 10*3/uL (ref 0.0–0.2)
Basos: 0 %
EOS (ABSOLUTE): 0 10*3/uL (ref 0.0–0.4)
Eos: 0 %
Hematocrit: 39.7 % (ref 34.0–46.6)
Hemoglobin: 13.1 g/dL (ref 11.1–15.9)
Immature Grans (Abs): 0 10*3/uL (ref 0.0–0.1)
Immature Granulocytes: 0 %
Lymphocytes Absolute: 1.8 10*3/uL (ref 0.7–3.1)
Lymphs: 19 %
MCH: 30.6 pg (ref 26.6–33.0)
MCHC: 33 g/dL (ref 31.5–35.7)
MCV: 93 fL (ref 79–97)
Monocytes Absolute: 0.5 10*3/uL (ref 0.1–0.9)
Monocytes: 5 %
Neutrophils Absolute: 7.3 10*3/uL — ABNORMAL HIGH (ref 1.4–7.0)
Neutrophils: 76 %
Platelets: 270 10*3/uL (ref 150–450)
RBC: 4.28 x10E6/uL (ref 3.77–5.28)
RDW: 12.4 % (ref 11.7–15.4)
WBC: 9.7 10*3/uL (ref 3.4–10.8)

## 2021-04-26 LAB — COMPREHENSIVE METABOLIC PANEL
ALT: 7 IU/L (ref 0–32)
AST: 9 IU/L (ref 0–40)
Albumin/Globulin Ratio: 2 (ref 1.2–2.2)
Albumin: 4.2 g/dL (ref 3.8–4.8)
Alkaline Phosphatase: 63 IU/L (ref 44–121)
BUN/Creatinine Ratio: 19 (ref 9–23)
BUN: 15 mg/dL (ref 6–24)
Bilirubin Total: 0.3 mg/dL (ref 0.0–1.2)
CO2: 23 mmol/L (ref 20–29)
Calcium: 9.6 mg/dL (ref 8.7–10.2)
Chloride: 102 mmol/L (ref 96–106)
Creatinine, Ser: 0.79 mg/dL (ref 0.57–1.00)
Globulin, Total: 2.1 g/dL (ref 1.5–4.5)
Glucose: 96 mg/dL (ref 65–99)
Potassium: 4.1 mmol/L (ref 3.5–5.2)
Sodium: 138 mmol/L (ref 134–144)
Total Protein: 6.3 g/dL (ref 6.0–8.5)
eGFR: 96 mL/min/{1.73_m2} (ref >59)

## 2021-04-26 LAB — MAGNESIUM: Magnesium: 1.9 mg/dL (ref 1.6–2.3)

## 2021-04-26 LAB — T4, FREE: Free T4: 1.12 ng/dL (ref 0.82–1.77)

## 2021-04-26 LAB — RPR: RPR Ser Ql: NONREACTIVE

## 2021-04-26 LAB — LIPID PANEL
Chol/HDL Ratio: 3.3 ratio (ref 0.0–4.4)
Cholesterol, Total: 206 mg/dL — ABNORMAL HIGH (ref 100–199)
HDL: 63 mg/dL (ref >39)
LDL Chol Calc (NIH): 130 mg/dL — ABNORMAL HIGH (ref 0–99)
Triglycerides: 74 mg/dL (ref 0–149)
VLDL Cholesterol Cal: 13 mg/dL (ref 5–40)

## 2021-04-26 LAB — HIV ANTIBODY (ROUTINE TESTING W REFLEX): HIV Screen 4th Generation wRfx: NONREACTIVE

## 2021-04-26 LAB — HEPATITIS C ANTIBODY: Hep C Virus Ab: <0.1 s/co ratio (ref 0.0–0.9)

## 2021-04-26 LAB — HEMOGLOBIN A1C
Est. average glucose Bld gHb Est-mCnc: 108 mg/dL
Hgb A1c MFr Bld: 5.4 % (ref 4.8–5.6)

## 2021-04-26 LAB — VITAMIN B12: Vitamin B-12: 391 pg/mL (ref 232–1245)

## 2021-04-26 LAB — VITAMIN D 25 HYDROXY (VIT D DEFICIENCY, FRACTURES): Vit D, 25-Hydroxy: 32.3 ng/mL (ref 30.0–100.0)

## 2021-04-26 LAB — TSH: TSH: 2.15 u[IU]/mL (ref 0.450–4.500)

## 2021-04-27 LAB — URINE CYTOLOGY ANCILLARY ONLY
Candida Urine: NEGATIVE
Chlamydia: NEGATIVE
Comment: NEGATIVE
Comment: NEGATIVE
Comment: NORMAL
Neisseria Gonorrhea: NEGATIVE
Trichomonas: NEGATIVE

## 2021-05-07 ENCOUNTER — Other Ambulatory Visit: Payer: Self-pay | Admitting: Family Medicine

## 2021-05-07 ENCOUNTER — Telehealth: Payer: Self-pay

## 2021-05-07 ENCOUNTER — Ambulatory Visit: Payer: Managed Care, Other (non HMO)

## 2021-05-07 NOTE — Telephone Encounter (Signed)
Patient has been advised. KW 

## 2021-05-07 NOTE — Telephone Encounter (Signed)
Copied from CRM [id number]. Topic: General - Other >> May 07, 2021  8:46 AM Jaquita Rector A wrote: Reason for CRM: Patient called in to inform Ms Suzie Portela that she tested positive for Covid this past Friday and her job need herr to be negative before coming back to work. But in order to get home test from the pharmacy she need an order sent to the pharmacy Please advise Ph# [phone]

## 2021-05-07 NOTE — Telephone Encounter (Signed)
Patient is requesting order to have buy at home test, this im assuming is a prescription but Im unsure if we order prescription for at home test kit. Please review. KW

## 2021-05-08 ENCOUNTER — Encounter: Payer: Self-pay | Admitting: Radiology

## 2021-05-18 ENCOUNTER — Other Ambulatory Visit: Payer: Self-pay | Admitting: Family Medicine

## 2021-05-18 DIAGNOSIS — F5102 Adjustment insomnia: Secondary | ICD-10-CM

## 2021-07-04 ENCOUNTER — Ambulatory Visit: Payer: Managed Care, Other (non HMO) | Admitting: Family Medicine

## 2021-07-24 ENCOUNTER — Other Ambulatory Visit: Payer: Self-pay

## 2021-07-24 ENCOUNTER — Ambulatory Visit
Admission: RE | Admit: 2021-07-24 | Discharge: 2021-07-24 | Disposition: A | Discharge status: Home / Self Care | Payer: Managed Care, Other (non HMO) | Source: Ambulatory Visit | Attending: Family Medicine | Admitting: Family Medicine

## 2021-07-24 DIAGNOSIS — Z1231 Encounter for screening mammogram for malignant neoplasm of breast: Secondary | ICD-10-CM | POA: Insufficient documentation

## 2021-07-25 ENCOUNTER — Other Ambulatory Visit: Payer: Self-pay | Admitting: Family Medicine

## 2021-07-25 DIAGNOSIS — N6489 Other specified disorders of breast: Secondary | ICD-10-CM

## 2021-07-25 DIAGNOSIS — R928 Other abnormal and inconclusive findings on diagnostic imaging of breast: Secondary | ICD-10-CM

## 2021-08-07 ENCOUNTER — Other Ambulatory Visit: Payer: Self-pay

## 2021-08-07 ENCOUNTER — Ambulatory Visit
Admission: RE | Admit: 2021-08-07 | Discharge: 2021-08-07 | Disposition: A | Discharge status: Home / Self Care | Payer: Managed Care, Other (non HMO) | Source: Ambulatory Visit | Attending: Family Medicine | Admitting: Family Medicine

## 2021-08-07 DIAGNOSIS — N6489 Other specified disorders of breast: Secondary | ICD-10-CM | POA: Diagnosis present

## 2021-08-07 DIAGNOSIS — R928 Other abnormal and inconclusive findings on diagnostic imaging of breast: Secondary | ICD-10-CM | POA: Insufficient documentation

## 2021-08-15 ENCOUNTER — Ambulatory Visit (INDEPENDENT_AMBULATORY_CARE_PROVIDER_SITE_OTHER): Payer: Managed Care, Other (non HMO) | Admitting: Family Medicine

## 2021-08-15 ENCOUNTER — Encounter: Payer: Self-pay | Admitting: Family Medicine

## 2021-08-15 ENCOUNTER — Other Ambulatory Visit: Payer: Self-pay

## 2021-08-15 VITALS — BP 118/81 | HR 74 | Wt 152.0 lb

## 2021-08-15 DIAGNOSIS — Z23 Encounter for immunization: Secondary | ICD-10-CM

## 2021-08-15 DIAGNOSIS — Z01419 Encounter for gynecological examination (general) (routine) without abnormal findings: Secondary | ICD-10-CM | POA: Diagnosis not present

## 2021-08-15 DIAGNOSIS — Z124 Encounter for screening for malignant neoplasm of cervix: Secondary | ICD-10-CM

## 2021-08-15 NOTE — Progress Notes (Signed)
LMP: 07/23/21  Last Mamm: 07/24/21

## 2021-08-15 NOTE — Progress Notes (Signed)
Subjective:     Victoria [name] is a 40 y.o. female and is here for a comprehensive physical exam. The patient reports no problems. Husband is back home. Taking Buspar prn.  The following portions of the patient's history were reviewed and updated as appropriate: allergies, current medications, past family history, past medical history, past social history, past surgical history, and problem list.  Review of Systems Pertinent items noted in HPI and remainder of comprehensive ROS otherwise negative.   Objective:    BP 118/81    Pulse 74    Wt 152 lb (68.9 kg)    LMP 07/23/2021    BMI 24.17 kg/m  General appearance: alert, cooperative, and appears stated age Head: Normocephalic, without obvious abnormality, atraumatic Neck: no adenopathy, supple, symmetrical, trachea midline, and thyroid not enlarged, symmetric, no tenderness/mass/nodules Lungs: clear to auscultation bilaterally Heart: regular rate and rhythm, S1, S2 normal, no murmur, click, rub or gallop Abdomen: soft, non-tender; bowel sounds normal; no masses,  no organomegaly Pelvic: cervix normal in appearance, external genitalia normal, no adnexal masses or tenderness, no cervical motion tenderness, uterus normal size, shape, and consistency, and vagina normal without discharge Extremities: extremities normal, atraumatic, no cyanosis or edema Pulses: 2+ and symmetric Skin: Skin color, texture, turgor normal. No rashes or lesions Lymph nodes: Cervical, supraclavicular, and axillary nodes normal. Neurologic: Grossly normal    Assessment:    Healthy female exam.      Plan:  Screening for malignant neoplasm of cervix - Plan: IGP, Aptima HPV, rfx 16/18,45  Encounter for gynecological examination without abnormal finding  Return in 1 year (on 08/15/2022).    See After Visit Summary for Counseling Recommendations

## 2021-08-15 NOTE — Patient Instructions (Signed)
Preventive Care 21-41 Years Old, Female °Preventive care refers to lifestyle choices and visits with your health care provider that can promote health and wellness. Preventive care visits are also called wellness exams. °What can I expect for my preventive care visit? °Counseling °During your preventive care visit, your health care provider may ask about your: °Medical history, including: °Past medical problems. °Family medical history. °Pregnancy history. °Current health, including: °Menstrual cycle. °Method of birth control. °Emotional well-being. °Home life and relationship well-being. °Sexual activity and sexual health. °Lifestyle, including: °Alcohol, nicotine or tobacco, and drug use. °Access to firearms. °Diet, exercise, and sleep habits. °Work and work environment. °Sunscreen use. °Safety issues such as seatbelt and bike helmet use. °Physical exam °Your health care provider may check your: °Height and weight. These may be used to calculate your BMI (body mass index). BMI is a measurement that tells if you are at a healthy weight. °Waist circumference. This measures the distance around your waistline. This measurement also tells if you are at a healthy weight and may help predict your risk of certain diseases, such as type 2 diabetes and high blood pressure. °Heart rate and blood pressure. °Body temperature. °Skin for abnormal spots. °What immunizations do I need? °Vaccines are usually given at various ages, according to a schedule. Your health care provider will recommend vaccines for you based on your age, medical history, and lifestyle or other factors, such as travel or where you work. °What tests do I need? °Screening °Your health care provider may recommend screening tests for certain conditions. This may include: °Pelvic exam and Pap test. °Lipid and cholesterol levels. °Diabetes screening. This is done by checking your blood sugar (glucose) after you have not eaten for a while (fasting). °Hepatitis B  test. °Hepatitis C test. °HIV (human immunodeficiency virus) test. °STI (sexually transmitted infection) testing, if you are at risk. °BRCA-related cancer screening. This may be done if you have a family history of breast, ovarian, tubal, or peritoneal cancers. °Talk with your health care provider about your test results, treatment options, and if necessary, the need for more tests. °Follow these instructions at home: °Eating and drinking ° °Eat a healthy diet that includes fresh fruits and vegetables, whole grains, lean protein, and low-fat dairy products. °Take vitamin and mineral supplements as recommended by your health care provider. °Do not drink alcohol if: °Your health care provider tells you not to drink. °You are pregnant, may be pregnant, or are planning to become pregnant. °If you drink alcohol: °Limit how much you have to 0-1 drink a day. °Know how much alcohol is in your drink. In the [place] one drink equals one 12 oz bottle of beer (355 mL), one 5 oz glass of wine (148 mL), or one 1½ oz glass of hard liquor (44 mL). °Lifestyle °Brush your teeth every morning and night with fluoride toothpaste. Floss one time each day. °Exercise for at least 30 minutes 5 or more days each week. °Do not use any products that contain nicotine or tobacco. These products include cigarettes, chewing tobacco, and vaping devices, such as e-cigarettes. If you need help quitting, ask your health care provider. °Do not use drugs. °If you are sexually active, practice safe sex. Use a condom or other form of protection to prevent STIs. °If you do not wish to become pregnant, use a form of birth control. If you plan to become pregnant, see your health care provider for a prepregnancy visit. °Find healthy ways to manage stress, such as: °Meditation, yoga,   or listening to music. °Journaling. °Talking to a trusted person. °Spending time with friends and family. °Minimize exposure to UV radiation to reduce your risk of skin  cancer. °Safety °Always wear your seat belt while driving or riding in a vehicle. °Do not drive: °If you have been drinking alcohol. Do not ride with someone who has been drinking. °If you have been using any mind-altering substances or drugs. °While texting. °When you are tired or distracted. °Wear a helmet and other protective equipment during sports activities. °If you have firearms in your house, make sure you follow all gun safety procedures. °Seek help if you have been physically or sexually abused. °What's next? °Go to your health care provider once a year for an annual wellness visit. °Ask your health care provider how often you should have your eyes and teeth checked. °Stay up to date on all vaccines. °This information is not intended to replace advice given to you by your health care provider. Make sure you discuss any questions you have with your health care provider. °Document Revised: 01/31/2021 Document Reviewed: 01/31/2021 °Elsevier Patient Education © 2022 [employer] ° °

## 2021-08-16 LAB — IGP, APTIMA HPV, RFX 16/18,45
HPV Aptima: NEGATIVE
PAP Smear Comment: 0

## 2021-09-04 ENCOUNTER — Telehealth: Payer: Self-pay | Admitting: Physician Assistant

## 2021-09-04 NOTE — Telephone Encounter (Signed)
Copied from CRM [id number]. Topic: General - Other >> Sep 04, 2021 12:21 PM Jaquita Rector A wrote: Reason for CRM: Patient called in needing to speak to the office manager in reference to some invoices that she received regarding some lab work and additional diagnosis at time of her annual physical. Asking for a call back at Ph# [phone]

## 2021-09-04 NOTE — Telephone Encounter (Signed)
Email sent to [facility]ology to have them investigate - pt is [employer]

## 2021-09-07 NOTE — Telephone Encounter (Signed)
Per Amy Lax balance corrected for DOS 04/25/21 for STI swab.

## 2021-10-31 ENCOUNTER — Other Ambulatory Visit: Payer: Self-pay

## 2021-10-31 ENCOUNTER — Encounter: Payer: Self-pay | Admitting: Family Medicine

## 2021-10-31 ENCOUNTER — Ambulatory Visit: Payer: Managed Care, Other (non HMO) | Admitting: Family Medicine

## 2021-10-31 VITALS — BP 125/81 | HR 80 | Ht 66.0 in | Wt 155.6 lb

## 2021-10-31 DIAGNOSIS — N943 Premenstrual tension syndrome: Secondary | ICD-10-CM | POA: Diagnosis not present

## 2021-10-31 MED ORDER — SLYND 4 MG PO TABS: 1.0000 | ORAL_TABLET | Freq: Every day | ORAL | 1 refills | Status: DC

## 2021-10-31 NOTE — Assessment & Plan Note (Signed)
Cycles are q 21 days. Begin Aleve bid before next cycle starts. Trial of Slynd for control of symptoms. ?

## 2021-10-31 NOTE — Progress Notes (Signed)
? ?  Subjective:  ? ? Patient ID: Victoria [name] is a 41 y.o. female presenting with Menstrual Problem ? on 10/31/2021 ? ?HPI: ?Has ususally has heat the week before her cycle x last 5 years. Thought she had the flu at end of January, had headache that was debilitating. Noted she had sweats/chills, and then her period started. Had N/V. ?Same exact thing happened in February. Tried cold and sinus meds, NSAIDS and tylenol and nothing helped. One day after her cycle started all symptoms went away. ? ?Review of Systems  ?Constitutional:  Negative for chills and fever.  ?Respiratory:  Negative for shortness of breath.   ?Cardiovascular:  Negative for chest pain.  ?Gastrointestinal:  Negative for abdominal pain, nausea and vomiting.  ?Genitourinary:  Negative for dysuria.  ?Skin:  Negative for rash.  ?   ?Objective:  ?  ?BP 125/81   Pulse 80   Ht 5\' 6"  (1.676 m)   Wt 155 lb 9.6 oz (70.6 kg)   LMP 10/08/2021 (Exact Date)   BMI 25.11 kg/m?  ?Physical Exam ?Exam conducted with a chaperone present.  ?Constitutional:   ?   General: She is not in acute distress. ?   Appearance: She is well-developed.  ?HENT:  ?   Head: Normocephalic and atraumatic.  ?Eyes:  ?   General: No scleral icterus. ?Cardiovascular:  ?   Rate and Rhythm: Normal rate.  ?Pulmonary:  ?   Effort: Pulmonary effort is normal.  ?Abdominal:  ?   Palpations: Abdomen is soft.  ?Musculoskeletal:  ?   Cervical back: Neck supple.  ?Skin: ?   General: Skin is warm and dry.  ?Neurological:  ?   Mental Status: She is alert and oriented to person, place, and time.  ? ? ? ?   ?Assessment & Plan:  ? ?Problem List Items Addressed This Visit   ? ?  ? Unprioritized  ? Premenstrual syndrome - Primary  ?  Cycles are q 21 days. Begin Aleve bid before next cycle starts. Trial of Slynd for control of symptoms. ?  ?  ? Relevant Medications  ? Drospirenone (SLYND) 4 MG TABS  ? ? ? ?Return in about 3 months (around 01/31/2022) for a follow-up. ? ?02/02/2022,  MD ?10/31/2021 ?11:34 AM ? ? ? ?

## 2021-10-31 NOTE — Progress Notes (Signed)
Pt c/o flu like symptoms prior to each cycle.  ?

## 2021-12-05 ENCOUNTER — Telehealth: Payer: Self-pay | Admitting: *Deleted

## 2021-12-05 MED ORDER — SLYND 4 MG PO TABS: 4.0000 mg | ORAL_TABLET | Freq: Every day | ORAL | 11 refills | Status: DC

## 2021-12-05 NOTE — Telephone Encounter (Signed)
Pt called stating Dr Francesco Runner gave her samples of Slynd and if she liked them Dr Shawnie Pons would send in RX, pt is requesting RX sent in. Okay per Dr Shawnie Pons to sent in .  ?

## 2021-12-12 ENCOUNTER — Other Ambulatory Visit: Payer: Self-pay | Admitting: *Deleted

## 2021-12-12 MED ORDER — SLYND 4 MG PO TABS: 4.0000 mg | ORAL_TABLET | Freq: Every day | ORAL | 11 refills | Status: DC

## 2021-12-12 NOTE — Progress Notes (Signed)
Resending RX to speciality pharmacy  ?

## 2021-12-21 ENCOUNTER — Telehealth: Payer: Self-pay

## 2021-12-21 ENCOUNTER — Other Ambulatory Visit: Payer: Self-pay | Admitting: *Deleted

## 2021-12-21 DIAGNOSIS — Z1231 Encounter for screening mammogram for malignant neoplasm of breast: Secondary | ICD-10-CM

## 2021-12-21 NOTE — Telephone Encounter (Signed)
Order in epic. 

## 2021-12-21 NOTE — Telephone Encounter (Signed)
Copied from CRM [id number]. Topic: General - Other ?>> Dec 20, 2021  4:51 PM Aretta Nip wrote: ?Pt states was to do FU on breast exam every 6 mo just to track and she called [facility] and there was not an active order, pls reach out to pt [phone] ?

## 2021-12-25 ENCOUNTER — Other Ambulatory Visit: Payer: Self-pay | Admitting: Family Medicine

## 2021-12-25 DIAGNOSIS — N6489 Other specified disorders of breast: Secondary | ICD-10-CM

## 2022-02-06 ENCOUNTER — Ambulatory Visit
Admission: RE | Admit: 2022-02-06 | Discharge: 2022-02-06 | Disposition: A | Discharge status: Home / Self Care | Payer: Managed Care, Other (non HMO) | Source: Ambulatory Visit | Attending: Family Medicine | Admitting: Family Medicine

## 2022-02-06 DIAGNOSIS — N6489 Other specified disorders of breast: Secondary | ICD-10-CM | POA: Diagnosis present

## 2022-02-07 NOTE — Progress Notes (Signed)
Annual mammogram recommended in 07/2022 for previous asymmetry noted in breast tissue.  Please let us know if you have any questions.  Thank you, Jacky Kindle, FNP  [facility] [address] [place], [place] [address] [phone] (phone) [phone] (fax) [facility]

## 2022-03-22 ENCOUNTER — Other Ambulatory Visit: Payer: Self-pay | Admitting: Family Medicine

## 2022-03-22 DIAGNOSIS — F419 Anxiety disorder, unspecified: Secondary | ICD-10-CM

## 2022-04-03 ENCOUNTER — Ambulatory Visit (INDEPENDENT_AMBULATORY_CARE_PROVIDER_SITE_OTHER): Payer: Managed Care, Other (non HMO) | Admitting: Family Medicine

## 2022-04-03 ENCOUNTER — Encounter: Payer: Self-pay | Admitting: Family Medicine

## 2022-04-03 VITALS — BP 112/74 | HR 81 | Wt 143.0 lb

## 2022-04-03 DIAGNOSIS — N943 Premenstrual tension syndrome: Secondary | ICD-10-CM

## 2022-04-03 DIAGNOSIS — F419 Anxiety disorder, unspecified: Secondary | ICD-10-CM | POA: Diagnosis not present

## 2022-04-03 MED ORDER — NORETHIN-ETH ESTRAD-FE BIPHAS 1 MG-10 MCG / 10 MCG PO TABS: 1.0000 | ORAL_TABLET | Freq: Every day | ORAL | 11 refills | Status: DC

## 2022-04-03 MED ORDER — BUSPIRONE HCL 7.5 MG PO TABS: 7.5000 mg | ORAL_TABLET | Freq: Three times a day (TID) | ORAL | 1 refills | Status: DC

## 2022-04-03 NOTE — Assessment & Plan Note (Signed)
Trial of LoLoestrin--samples x 2 given with rx--if too expensive, change to LoEstrin.

## 2022-04-03 NOTE — Progress Notes (Signed)
Would like to change OCP, slynd made bleeding worse   Wants refills on buspar

## 2022-04-03 NOTE — Progress Notes (Signed)
   Subjective:    Patient ID: Victoria [name] is a 41 y.o. female presenting with Follow-up (Birth control)  on 04/03/2022  HPI: Was really regular with her cycles. Put on Slynd and noted she has had some increasing frequency and is now having worse back pain related ot her cycle.  Review of Systems  Constitutional:  Negative for chills and fever.  Respiratory:  Negative for shortness of breath.   Cardiovascular:  Negative for chest pain.  Gastrointestinal:  Negative for abdominal pain, nausea and vomiting.  Genitourinary:  Negative for dysuria.  Skin:  Negative for rash.      Objective:    BP 112/74   Pulse 81   Wt 143 lb (64.9 kg)   BMI 23.08 kg/m  Physical Exam Exam conducted with a chaperone present.  Constitutional:      General: She is not in acute distress.    Appearance: She is well-developed.  HENT:     Head: Normocephalic and atraumatic.  Eyes:     General: No scleral icterus. Cardiovascular:     Rate and Rhythm: Normal rate.  Pulmonary:     Effort: Pulmonary effort is normal.  Abdominal:     Palpations: Abdomen is soft.  Musculoskeletal:     Cervical back: Neck supple.  Skin:    General: Skin is warm and dry.  Neurological:     Mental Status: She is alert and oriented to person, place, and time.         Assessment & Plan:   Problem List Items Addressed This Visit       Unprioritized   Anxiety   Relevant Medications   busPIRone (BUSPAR) 7.5 MG tablet   Premenstrual syndrome - Primary    Trial of LoLoestrin--samples x 2 given with rx--if too expensive, change to LoEstrin.      Relevant Medications   Norethindrone-Ethinyl Estradiol-Fe Biphas (LO LOESTRIN FE) 1 MG-10 MCG / 10 MCG tablet    Return if symptoms worsen or fail to improve.  Reva Bores, MD 04/03/2022 2:43 PM

## 2022-04-10 ENCOUNTER — Ambulatory Visit: Payer: Managed Care, Other (non HMO) | Admitting: Family Medicine

## 2022-05-07 NOTE — Progress Notes (Unsigned)
Complete physical exam   [name]   DOB: 1981/08/03   41 y.o. Female  MRN: [id number] Visit Date: 05/08/2022  Today's healthcare provider: [name], FNP  Re Introduced to nurse practitioner role and practice setting.  All questions answered.  Discussed provider/patient relationship and expectations.  I,Courtez Twaddle J Tamar Lipscomb,acting as a scribe for [name], FNP.,have documented all relevant documentation on the behalf of [name], FNP,as directed by  [name], FNP while in the presence of [name], FNP.   Chief Complaint  Patient presents with   Annual Exam   Subjective    Victoria [name] is a 41 y.o. female who presents today for a complete physical exam.  She reports consuming a general diet. Home exercise routine includes walking. She generally feels well. She reports sleeping well. She does have additional problems to discuss today. States she rolled her L ankle and it is swollen.  HPI    Past Medical History:  Diagnosis Date   Abnormal Pap smear of cervix    Calculus of kidney    left distal ureteral stone   Dyspareunia    Past Surgical History:  Procedure Laterality Date   CRYOTHERAPY     AGE 24   LITHOTRIPSY     TUBAL LIGATION     Social History   Socioeconomic History   Marital status: Married    Spouse name: Not on file   Number of children: 2   Years of education: Not on file   Highest education level: Not on file  Occupational History   Not on file  Tobacco Use   Smoking status: Former    Types: Cigarettes    Quit date: 08/19/2004    Years since quitting: 17.7   Smokeless tobacco: Never   Tobacco comments:    Previous social smoking- maybe 5 cigs/week  Vaping Use   Vaping Use: Never used  Substance and Sexual Activity   Alcohol use: Yes    Alcohol/week: 0.0 standard drinks of alcohol    Comment: OCCASIONALLY   Drug use: No   Sexual activity: Yes    Partners: Male    Birth control/protection: Surgical     Comment: tubaligation.  Other Topics Concern   Not on file  Social History Narrative   Not on file   Social Determinants of Health   Financial Resource Strain: Not on file  Food Insecurity: Not on file  Transportation Needs: Not on file  Physical Activity: Not on file  Stress: Not on file  Social Connections: Not on file  Intimate Partner Violence: Not on file   Family Status  Relation Name Status   Mat Aunt  Deceased   PGF  Deceased   PGM  Deceased   Brother  Alive   Mother  Alive   Father  Deceased   Brother  Alive   Daughter  Alive   Son  Alive   Family History  Problem Relation Age of Onset   Breast cancer Maternal Aunt        1/2    Cancer Maternal Aunt        breast   Heart attack Paternal Grandfather    Heart disease Paternal Grandfather        heart atack   Heart attack Paternal Grandmother    Heart disease Paternal Grandmother        heart attack   Hypertension Brother    Alcohol abuse Brother  Pancreatitis Brother    Alcohol abuse Father    Allergies  Allergen Reactions   Bactrim Other (See Comments)    Dizziness    Sulfa Antibiotics Other (See Comments)    dizziness    Patient Care Team: [name], FNP as PCP - General (Family Medicine)   Medications: Outpatient Medications Prior to Visit  Medication Sig   busPIRone (BUSPAR) 7.5 MG tablet Take 1 tablet (7.5 mg total) by mouth 3 (three) times daily.   Drospirenone (SLYND) 4 MG TABS Take 1 tablet by mouth daily.   Drospirenone (SLYND) 4 MG TABS Take 4 mg by mouth daily.   hydrOXYzine (ATARAX) 10 MG tablet Take 10 mg by mouth 4 (four) times daily as needed.   Norethindrone-Ethinyl Estradiol-Fe Biphas (LO LOESTRIN FE) 1 MG-10 MCG / 10 MCG tablet Take 1 tablet by mouth daily.   traZODone (DESYREL) 50 MG tablet TAKE 0.5-1 TABLETS BY MOUTH AT BEDTIME AS NEEDED FOR SLEEP.   No facility-administered medications prior to visit.    Review of Systems   Objective     BP 125/76 (BP  Location: Right Arm, Patient Position: Sitting, Cuff Size: Normal)   Pulse 99   Resp 16   Ht 5\' 6"  (1.676 m)   Wt 140 lb (63.5 kg)   SpO2 99%   BMI 22.60 kg/m    Physical Exam Vitals and nursing note reviewed.  Constitutional:      General: She is awake. She is not in acute distress.    Appearance: Normal appearance. She is well-developed, well-groomed and normal weight. She is not ill-appearing, toxic-appearing or diaphoretic.  HENT:     Head: Normocephalic and atraumatic.     Jaw: There is normal jaw occlusion. No trismus, tenderness, swelling or pain on movement.     Right Ear: Hearing, tympanic membrane, ear canal and external ear normal. There is no impacted cerumen.     Left Ear: Hearing, tympanic membrane, ear canal and external ear normal. There is no impacted cerumen.     Nose: Nose normal. No congestion or rhinorrhea.     Right Turbinates: Not enlarged, swollen or pale.     Left Turbinates: Not enlarged, swollen or pale.     Right Sinus: No maxillary sinus tenderness or frontal sinus tenderness.     Left Sinus: No maxillary sinus tenderness or frontal sinus tenderness.     Mouth/Throat:     Lips: Pink.     Mouth: Mucous membranes are moist. No injury.     Tongue: No lesions.     Pharynx: Oropharynx is clear. Uvula midline. No pharyngeal swelling, oropharyngeal exudate, posterior oropharyngeal erythema or uvula swelling.     Tonsils: No tonsillar exudate or tonsillar abscesses.  Eyes:     General: Lids are normal. Lids are everted, no foreign bodies appreciated. Vision grossly intact. Gaze aligned appropriately. No allergic shiner or visual field deficit.       Right eye: No discharge.        Left eye: No discharge.     Extraocular Movements: Extraocular movements intact.     Conjunctiva/sclera: Conjunctivae normal.     Right eye: Right conjunctiva is not injected. No exudate.    Left eye: Left conjunctiva is not injected. No exudate.    Pupils: Pupils are equal,  round, and reactive to light.  Neck:     Thyroid: No thyroid mass, thyromegaly or thyroid tenderness.     Vascular: No carotid bruit.     Trachea:  Trachea normal.  Cardiovascular:     Rate and Rhythm: Normal rate and regular rhythm.     Pulses: Normal pulses.          Carotid pulses are 2+ on the right side and 2+ on the left side.      Radial pulses are 2+ on the right side and 2+ on the left side.       Dorsalis pedis pulses are 2+ on the right side and 2+ on the left side.       Posterior tibial pulses are 2+ on the right side and 2+ on the left side.     Heart sounds: Normal heart sounds, S1 normal and S2 normal. No murmur heard.    No friction rub. No gallop.  Pulmonary:     Effort: Pulmonary effort is normal. No respiratory distress.     Breath sounds: Normal breath sounds and air entry. No stridor. No wheezing, rhonchi or rales.  Chest:     Chest wall: No tenderness.  Abdominal:     General: Abdomen is flat. Bowel sounds are normal. There is no distension.     Palpations: Abdomen is soft. There is no mass.     Tenderness: There is no abdominal tenderness. There is no right CVA tenderness, left CVA tenderness, guarding or rebound.     Hernia: No hernia is present.  Genitourinary:    Comments: Exam deferred; denies complaints Musculoskeletal:        General: No swelling, tenderness, deformity or signs of injury. Normal range of motion.     Cervical back: Full passive range of motion without pain, normal range of motion and neck supple. No edema, rigidity or tenderness. No muscular tenderness.     Right lower leg: No edema.     Left lower leg: No edema.  Lymphadenopathy:     Cervical: No cervical adenopathy.     Right cervical: No superficial, deep or posterior cervical adenopathy.    Left cervical: No superficial, deep or posterior cervical adenopathy.  Skin:    General: Skin is warm and dry.     Capillary Refill: Capillary refill takes less than 2 seconds.     Coloration:  Skin is not jaundiced or pale.     Findings: No bruising, erythema, lesion or rash.  Neurological:     General: No focal deficit present.     Mental Status: She is alert and oriented to person, place, and time. Mental status is at baseline.     GCS: GCS eye subscore is 4. GCS verbal subscore is 5. GCS motor subscore is 6.     Cranial Nerves: No cranial nerve deficit.     Sensory: Sensation is intact. No sensory deficit.     Motor: Motor function is intact. No weakness.     Coordination: Coordination is intact. Coordination normal.     Gait: Gait is intact. Gait normal.  Psychiatric:        Attention and Perception: Attention and perception normal.        Mood and Affect: Mood and affect normal.        Speech: Speech normal.        Behavior: Behavior normal. Behavior is cooperative.        Thought Content: Thought content normal.        Cognition and Memory: Cognition and memory normal.        Judgment: Judgment normal.      Last depression screening scores  05/08/2022    3:00 PM 04/25/2021    8:53 AM 02/17/2020    9:54 AM  PHQ 2/9 Scores  PHQ - 2 Score 2 2 0  PHQ- 9 Score 9 7 1    Last fall risk screening    05/08/2022    3:00 PM  [place] in the past year? 0  Number falls in past yr: 0  Injury with Fall? 0  Risk for fall due to : No Fall Risks  Follow up Falls evaluation completed   Last Audit-C alcohol use screening    05/08/2022    3:00 PM  Alcohol Use Disorder Test (AUDIT)  1. How often do you have a drink containing alcohol? 2  2. How many drinks containing alcohol do you have on a typical day when you are drinking? 1  3. How often do you have six or more drinks on one occasion? 0  AUDIT-C Score 3   A score of 3 or more in women, and 4 or more in men indicates increased risk for alcohol abuse, EXCEPT if all of the points are from question 1   No results found for any visits on 05/08/22.  Assessment & Plan    Routine Health Maintenance and Physical  Exam  Exercise Activities and Dietary recommendations  Goals   None     Immunization History  Administered Date(s) Administered   Influenza Split 06/05/2010, 08/15/2011   Influenza,inj,Quad PF,6+ Mos 04/23/2017, 06/12/2018, 05/31/2019, 06/06/2020, 08/15/2021, 05/08/2022   Tdap 06/05/2010    Health Maintenance  Topic Date Due   COVID-19 Vaccine (1) 05/24/2022 (Originally 09/26/1981)   TETANUS/TDAP  05/09/2023 (Originally 06/05/2020)   PAP SMEAR-Modifier  08/15/2024   INFLUENZA VACCINE  Completed   Hepatitis C Screening  Completed   HIV Screening  Completed   HPV VACCINES  Aged Out    Discussed health benefits of physical activity, and encouraged her to engage in regular exercise appropriate for her age and condition.  Problem List Items Addressed This Visit       Other   Annual physical exam - Primary    UTD on dental and denies vision concerns Things to do to keep yourself healthy  - Exercise at least 30-45 minutes a day, 3-4 days a week.  - Eat a low-fat diet with lots of fruits and vegetables, up to 7-9 servings per day.  - Seatbelts can save your life. Wear them always.  - Smoke detectors on every level of your home, check batteries every year.  - Eye Doctor - have an eye exam every 1-2 years  - Safe sex - if you may be exposed to STDs, use a condom.  - Alcohol -  If you drink, do it moderately, less than 2 drinks per day.  - [place]. Choose someone to speak for you if you are not able.  - Depression is common in our stressful world.If you're feeling down or losing interest in things you normally enjoy, please come in for a visit.  - Violence - If anyone is threatening or hurting you, please call immediately.  Plan for mammo in 12/23 for 6 month follow up Followed by GYN for PAP smears; however, request for STI screening       Relevant Orders   CBC with Differential/Platelet   Comprehensive Metabolic Panel (CMET)   TSH + free T4   Lipid  panel   At risk for sexually transmitted disease due to unprotected sex  Now separated from her husband, divorce papers have been filed. Request for STI screening given previous infidelity in marriage.       Relevant Orders   Hepatitis C Antibody   HIV antibody (with reflex)   RPR   NuSwab Vaginitis Plus (VG+)   HSV(herpes simplex vrs) 1+2 ab-IgG   Breast cancer screening by mammogram    Please call and schedule your mammogram:  [facility]  [place], [place],  [place]  [address] Get Driving Directions Main: [phone]  Sunday:Closed Monday:7:20 AM - 5:00 PM Tuesday:7:20 AM - 5:00 PM Wednesday:7:20 AM - 5:00 PM Thursday:7:20 AM - 5:00 PM Friday:7:20 AM - 4:30 PM Saturday:Closed        Relevant Orders   MM 3D SCREEN BREAST BILATERAL   Need for influenza vaccination    Consented; VIS made available; no immediate side effects following administration; plan to repeat annually        Relevant Orders   Flu Vaccine QUAD 6+ mos PF IM (Fluarix Quad PF) (Completed)     Return in about 1 year (around 05/12/2023) for annual examination.     Vonna Kotyk, FNP, have reviewed all documentation for this visit. The documentation on 05/08/22 for the exam, diagnosis, procedures, and orders are all accurate and complete.    [name], [place] [phone] (phone) [phone] (fax)  [place]

## 2022-05-08 ENCOUNTER — Encounter: Payer: Self-pay | Admitting: Family Medicine

## 2022-05-08 ENCOUNTER — Ambulatory Visit (INDEPENDENT_AMBULATORY_CARE_PROVIDER_SITE_OTHER): Payer: Managed Care, Other (non HMO) | Admitting: Family Medicine

## 2022-05-08 VITALS — BP 125/76 | HR 99 | Resp 16 | Ht 66.0 in | Wt 140.0 lb

## 2022-05-08 DIAGNOSIS — Z0000 Encounter for general adult medical examination without abnormal findings: Secondary | ICD-10-CM | POA: Diagnosis not present

## 2022-05-08 DIAGNOSIS — Z9189 Other specified personal risk factors, not elsewhere classified: Secondary | ICD-10-CM

## 2022-05-08 DIAGNOSIS — Z1231 Encounter for screening mammogram for malignant neoplasm of breast: Secondary | ICD-10-CM | POA: Diagnosis not present

## 2022-05-08 DIAGNOSIS — Z23 Encounter for immunization: Secondary | ICD-10-CM | POA: Diagnosis not present

## 2022-05-08 DIAGNOSIS — N898 Other specified noninflammatory disorders of vagina: Secondary | ICD-10-CM

## 2022-05-08 NOTE — Assessment & Plan Note (Signed)
UTD on dental and denies vision concerns Things to do to keep yourself healthy  - Exercise at least 30-45 minutes a day, 3-4 days a week.  - Eat a low-fat diet with lots of fruits and vegetables, up to 7-9 servings per day.  - Seatbelts can save your life. Wear them always.  - Smoke detectors on every level of your home, check batteries every year.  - Eye Doctor - have an eye exam every 1-2 years  - Safe sex - if you may be exposed to STDs, use a condom.  - Alcohol -  If you drink, do it moderately, less than 2 drinks per day.  - [place]. Choose someone to speak for you if you are not able.  - Depression is common in our stressful world.If you're feeling down or losing interest in things you normally enjoy, please come in for a visit.  - Violence - If anyone is threatening or hurting you, please call immediately.  Plan for mammo in 12/23 for 6 month follow up Followed by GYN for PAP smears; however, request for STI screening

## 2022-05-08 NOTE — Assessment & Plan Note (Signed)
Consented; VIS made available; no immediate side effects following administration; plan to repeat annually   

## 2022-05-08 NOTE — Assessment & Plan Note (Signed)
Now separated from her husband, divorce papers have been filed. Request for STI screening given previous infidelity in marriage.

## 2022-05-08 NOTE — Assessment & Plan Note (Signed)
Please call and schedule your mammogram:  [facility]  [address], [address] [facility] [place],  [place]  [address] Get Driving Directions Main: [phone]  Sunday:Closed Monday:7:20 AM - 5:00 PM Tuesday:7:20 AM - 5:00 PM Wednesday:7:20 AM - 5:00 PM Thursday:7:20 AM - 5:00 PM Friday:7:20 AM - 4:30 PM Saturday:Closed  

## 2022-05-09 LAB — HIV ANTIBODY (ROUTINE TESTING W REFLEX): HIV Screen 4th Generation wRfx: NONREACTIVE

## 2022-05-09 LAB — COMPREHENSIVE METABOLIC PANEL
ALT: 9 IU/L (ref 0–32)
AST: 14 IU/L (ref 0–40)
Albumin/Globulin Ratio: 1.8 (ref 1.2–2.2)
Albumin: 4.5 g/dL (ref 3.9–4.9)
Alkaline Phosphatase: 68 IU/L (ref 44–121)
BUN/Creatinine Ratio: 11 (ref 9–23)
BUN: 9 mg/dL (ref 6–24)
Bilirubin Total: 0.3 mg/dL (ref 0.0–1.2)
CO2: 23 mmol/L (ref 20–29)
Calcium: 9.4 mg/dL (ref 8.7–10.2)
Chloride: 103 mmol/L (ref 96–106)
Creatinine, Ser: 0.8 mg/dL (ref 0.57–1.00)
Globulin, Total: 2.5 g/dL (ref 1.5–4.5)
Glucose: 101 mg/dL — ABNORMAL HIGH (ref 70–99)
Potassium: 4 mmol/L (ref 3.5–5.2)
Sodium: 141 mmol/L (ref 134–144)
Total Protein: 7 g/dL (ref 6.0–8.5)
eGFR: 94 mL/min/{1.73_m2} (ref >59)

## 2022-05-09 LAB — CBC WITH DIFFERENTIAL/PLATELET
Basophils Absolute: 0 10*3/uL (ref 0.0–0.2)
Basos: 1 %
EOS (ABSOLUTE): 0 10*3/uL (ref 0.0–0.4)
Eos: 0 %
Hematocrit: 43 % (ref 34.0–46.6)
Hemoglobin: 14.1 g/dL (ref 11.1–15.9)
Immature Grans (Abs): 0 10*3/uL (ref 0.0–0.1)
Immature Granulocytes: 0 %
Lymphocytes Absolute: 1.9 10*3/uL (ref 0.7–3.1)
Lymphs: 26 %
MCH: 30.5 pg (ref 26.6–33.0)
MCHC: 32.8 g/dL (ref 31.5–35.7)
MCV: 93 fL (ref 79–97)
Monocytes Absolute: 0.4 10*3/uL (ref 0.1–0.9)
Monocytes: 5 %
Neutrophils Absolute: 4.9 10*3/uL (ref 1.4–7.0)
Neutrophils: 68 %
Platelets: 325 10*3/uL (ref 150–450)
RBC: 4.62 x10E6/uL (ref 3.77–5.28)
RDW: 12.3 % (ref 11.7–15.4)
WBC: 7.2 10*3/uL (ref 3.4–10.8)

## 2022-05-09 LAB — RPR: RPR Ser Ql: NONREACTIVE

## 2022-05-09 LAB — LIPID PANEL
Chol/HDL Ratio: 3.7 ratio (ref 0.0–4.4)
Cholesterol, Total: 217 mg/dL — ABNORMAL HIGH (ref 100–199)
HDL: 59 mg/dL (ref >39)
LDL Chol Calc (NIH): 142 mg/dL — ABNORMAL HIGH (ref 0–99)
Triglycerides: 89 mg/dL (ref 0–149)
VLDL Cholesterol Cal: 16 mg/dL (ref 5–40)

## 2022-05-09 LAB — HSV 1 AND 2 AB, IGG
HSV 1 Glycoprotein G Ab, IgG: 2 index — ABNORMAL HIGH (ref 0.00–0.90)
HSV 2 IgG, Type Spec: <0.91 index (ref 0.00–0.90)

## 2022-05-09 LAB — TSH+FREE T4
Free T4: 1.28 ng/dL (ref 0.82–1.77)
TSH: 0.857 u[IU]/mL (ref 0.450–4.500)

## 2022-05-09 LAB — HEPATITIS C ANTIBODY: Hep C Virus Ab: NONREACTIVE

## 2022-05-09 NOTE — Progress Notes (Signed)
Hi Amit,  Labs are stable overall; however, there was a slight elevation in your bad/LDL cholesterol from last year.   The 10-year ASCVD risk score (Arnett DK, et al., 2019) is: 0.6%   Values used to calculate the score:     Age: 41 years     Sex: Female     Is Non-Hispanic African American: No     Diabetic: No     Tobacco smoker: No     Systolic Blood Pressure: 287 mmHg     Is BP treated: No     HDL Cholesterol: 59 mg/dL     Total Cholesterol: 217 mg/dL  Heart attack and stroke risk is 1% estimated within the next 10 years which is very low. I recommend diet low in saturated fat and regular exercise - 30 min at least 5 times per week  Negative blood work for Hep C, HIV and Syphillis. Others remain pending.  [name], [address] [place], [place] [address] [phone] (phone) [phone] (fax) [place]

## 2022-05-10 NOTE — Progress Notes (Signed)
Negative trich, chlamydia and gonorrhea. BV panel is pending.  [name], [place] [address] [place], [place] [address] [phone] (phone) [phone] (fax) [place]

## 2022-05-11 LAB — NUSWAB VAGINITIS PLUS (VG+)
Candida albicans, NAA: NEGATIVE
Candida glabrata, NAA: NEGATIVE
Chlamydia trachomatis, NAA: NEGATIVE
Neisseria gonorrhoeae, NAA: NEGATIVE
Trich vag by NAA: NEGATIVE

## 2022-05-11 LAB — SPECIMEN STATUS REPORT

## 2022-05-12 NOTE — Progress Notes (Signed)
Negative BV/yeast.  [name], [place] [address] [place], [place] [address] [phone] (phone) [phone] (fax) [place]

## 2022-05-24 ENCOUNTER — Other Ambulatory Visit: Payer: Self-pay | Admitting: Family Medicine

## 2022-05-24 NOTE — Telephone Encounter (Signed)
Requested medication (s) are due for refill today:   Not sure listed as historical  Requested medication (s) are on the active medication list:   Yes as historical  Future visit scheduled:   No   Had CPE 2 weeks ago by Tally Joe   Last ordered: 03/22/2022 - historical  Returned because it's listed as historical and from another provider.     Requested Prescriptions  Pending Prescriptions Disp Refills   hydrOXYzine (ATARAX) 10 MG tablet [Pharmacy Med Name: HYDROXYZINE HCL 10 MG TABLET] 360 tablet 3    Sig: TAKE 1 TABLET BY MOUTH 4 TIMES DAILY AS NEEDED.     Ear, Nose, and Throat:  Antihistamines 2 Passed - 05/24/2022  1:44 PM      Passed - Cr in normal range and within 360 days    Creatinine, Ser  Date Value Ref Range Status  05/08/2022 0.80 0.57 - 1.00 mg/dL Final         Passed - Valid encounter within last 12 months    Recent Outpatient Visits           2 weeks ago Annual physical exam   [facility] [name], [place]   1 year ago Encounter for preventive health examination   [facility] Tally Joe T, FNP   1 year ago Gastroesophageal reflux disease, unspecified whether esophagitis present   [facility] Flinchum, Kelby Aline, FNP   2 years ago Sore throat   [facility] [place], [place], [place]   2 years ago Annual physical exam   [place], [place], [place]

## 2022-06-27 ENCOUNTER — Other Ambulatory Visit: Payer: Self-pay | Admitting: Family Medicine

## 2022-06-27 DIAGNOSIS — N898 Other specified noninflammatory disorders of vagina: Secondary | ICD-10-CM | POA: Insufficient documentation

## 2022-06-28 ENCOUNTER — Encounter: Payer: Self-pay | Admitting: Physician Assistant

## 2022-06-28 ENCOUNTER — Ambulatory Visit (INDEPENDENT_AMBULATORY_CARE_PROVIDER_SITE_OTHER): Payer: Managed Care, Other (non HMO) | Admitting: Physician Assistant

## 2022-06-28 VITALS — BP 107/82 | HR 99 | Resp 16 | Wt 144.2 lb

## 2022-06-28 DIAGNOSIS — M25562 Pain in left knee: Secondary | ICD-10-CM

## 2022-06-28 NOTE — Progress Notes (Unsigned)
I,Joseline E Rosas,acting as a Neurosurgeon for [employer], PA-C.,have documented all relevant documentation on the behalf of Debera Lat, PA-C,as directed by  [employer], PA-C while in the presence of [employer], PA-C.    Established patient visit   [name]   DOB: May 31, 1981   41 y.o. Female  MRN: [id number] Visit Date: 06/28/2022  Today's healthcare provider: Debera Lat, PA-C   Chief Complaint  Patient presents with   Knee Fluid   Subjective    HPI  Knee Pain/Fluid: Patient presents fluid on kneew involving the  left knee. Onset of the symptoms was several weeks ago. Inciting event: none known. Current symptoms include swelling and pain in joint area with bending . Pain is aggravated by  none .  Patient has had no prior knee problems. Evaluation to date: none. Treatment to date:  Knee Brace  .   Medications: Outpatient Medications Prior to Visit  Medication Sig   busPIRone (BUSPAR) 7.5 MG tablet Take 1 tablet (7.5 mg total) by mouth 3 (three) times daily.   Drospirenone (SLYND) 4 MG TABS Take 1 tablet by mouth daily.   Drospirenone (SLYND) 4 MG TABS Take 4 mg by mouth daily.   hydrOXYzine (ATARAX) 10 MG tablet TAKE 1 TABLET BY MOUTH 4 TIMES DAILY AS NEEDED.   Norethindrone-Ethinyl Estradiol-Fe Biphas (LO LOESTRIN FE) 1 MG-10 MCG / 10 MCG tablet Take 1 tablet by mouth daily.   traZODone (DESYREL) 50 MG tablet TAKE 0.5-1 TABLETS BY MOUTH AT BEDTIME AS NEEDED FOR SLEEP.   No facility-administered medications prior to visit.    Review of Systems  All other systems reviewed and are negative. Except see HPI  {Labs  Heme  Chem  Endocrine  Serology  Results Review (optional):23779}   Objective    BP 107/82 (BP Location: Left Arm, Patient Position: Sitting, Cuff Size: Normal)   Pulse 99   Resp 16   Wt 144 lb 3.2 oz (65.4 kg)   BMI 23.27 kg/m  {Show previous vital signs (optional):23777}  Physical Exam Constitutional:      General: She is not  in acute distress.    Appearance: Normal appearance.  HENT:     Head: Normocephalic.  Pulmonary:     Effort: Pulmonary effort is normal. No respiratory distress.  Musculoskeletal:        General: Swelling (very mild?) and tenderness (below patella and above patella with standing and straigtening knee/on palpation) present.     Comments: Posterior/anterior drawer tests were negative.   Neurological:     Mental Status: She is alert and oriented to person, place, and time. Mental status is at baseline.     ***  No results found for any visits on 06/28/22.  Assessment & Plan     1. Left knee pain, unspecified chronicity Unclear about pain and swelling.  Per triage, it has been 3 weeks ago and was evaluated by [facility]. However, after XR results showed no abnormalities and she was prescribed NSAID, brace and rest. Per pt, she has developed knee swelling just recently and has been having trouble with standing from sitting position or with bending her knee. Reports having pain underneath of patella or above the patella. At the end of visit, pt was able to move/bend her knee without any problem. Pt was advised to do an XR knee. Pt declined at the time of appt. Pt was advised to proceed to [facility] but she had to pick up her daughter.  She planned to go to Ortho tomorrow. Recommended to do compression, raise, rest, ice and take Advil/with meals.  FU PRN   The patient was advised to call back or seek an in-person evaluation if the symptoms worsen or if the condition fails to improve as anticipated.  I discussed the assessment and treatment plan with the patient. The patient was provided an opportunity to ask questions and all were answered. The patient agreed with the plan and demonstrated an understanding of the instructions.  The entirety of the information documented in the History of Present Illness, Review of Systems and Physical Exam were personally obtained by me. Portions of  this information were initially documented by the CMA and reviewed by me for thoroughness and accuracy.     Mardene Speak, [facility], [place] [phone] (phone) [phone] (fax)

## 2022-09-11 ENCOUNTER — Ambulatory Visit: Payer: Managed Care, Other (non HMO) | Admitting: Family Medicine

## 2022-10-07 ENCOUNTER — Encounter: Payer: Self-pay | Admitting: Family Medicine

## 2022-10-07 MED ORDER — DOXYCYCLINE HYCLATE 100 MG PO CAPS: 100.0000 mg | ORAL_CAPSULE | Freq: Two times a day (BID) | ORAL | 0 refills | Status: DC

## 2022-10-10 ENCOUNTER — Other Ambulatory Visit: Payer: Self-pay | Admitting: Family Medicine

## 2022-10-10 DIAGNOSIS — F5102 Adjustment insomnia: Secondary | ICD-10-CM

## 2022-10-10 NOTE — Telephone Encounter (Signed)
Requested Prescriptions  Pending Prescriptions Disp Refills   traZODone (DESYREL) 50 MG tablet [Pharmacy Med Name: TRAZODONE 50 MG TABLET] 90 tablet 1    Sig: TAKE 1/2 TO 1 TABLET BY MOUTH AT BEDTIME AS NEEDED FOR SLEEP     Psychiatry: Antidepressants - Serotonin Modulator Passed - 10/10/2022  5:52 PM      Passed - Valid encounter within last 6 months    Recent Outpatient Visits           3 months ago Left knee pain, unspecified chronicity   [facility], [place], PA-C   5 months ago Annual physical exam   [facility] Tally Joe T, [place]   1 year ago Encounter for preventive health examination   [facility] Tally Joe T, FNP   1 year ago Gastroesophageal reflux disease, unspecified whether esophagitis present   [place]um, Kelby Aline, FNP   2 years ago Sore throat   [facility] [place], [place]

## 2022-10-15 ENCOUNTER — Other Ambulatory Visit: Payer: Self-pay | Admitting: Family Medicine

## 2022-10-15 DIAGNOSIS — F5102 Adjustment insomnia: Secondary | ICD-10-CM

## 2022-10-16 ENCOUNTER — Encounter: Payer: Self-pay | Admitting: Family Medicine

## 2022-10-16 ENCOUNTER — Ambulatory Visit (INDEPENDENT_AMBULATORY_CARE_PROVIDER_SITE_OTHER): Payer: Managed Care, Other (non HMO) | Admitting: Family Medicine

## 2022-10-16 VITALS — BP 126/79 | HR 103 | Ht 66.5 in | Wt 142.0 lb

## 2022-10-16 DIAGNOSIS — N939 Abnormal uterine and vaginal bleeding, unspecified: Secondary | ICD-10-CM

## 2022-10-16 DIAGNOSIS — F5102 Adjustment insomnia: Secondary | ICD-10-CM | POA: Diagnosis not present

## 2022-10-16 DIAGNOSIS — Z01419 Encounter for gynecological examination (general) (routine) without abnormal findings: Secondary | ICD-10-CM | POA: Diagnosis not present

## 2022-10-16 DIAGNOSIS — Z1231 Encounter for screening mammogram for malignant neoplasm of breast: Secondary | ICD-10-CM

## 2022-10-16 DIAGNOSIS — Z124 Encounter for screening for malignant neoplasm of cervix: Secondary | ICD-10-CM

## 2022-10-16 DIAGNOSIS — Z113 Encounter for screening for infections with a predominantly sexual mode of transmission: Secondary | ICD-10-CM

## 2022-10-16 MED ORDER — TRAZODONE HCL 50 MG PO TABS: 25.0000 mg | ORAL_TABLET | Freq: Every evening | ORAL | 1 refills | Status: DC | PRN

## 2022-10-16 NOTE — Progress Notes (Signed)
Subjective:     Victoria [name] is a 42 y.o. female and is here for a comprehensive physical exam. The patient reports problems - abnormal bleeding . Has had more bleeding than ususal. Completed a course of doxy which helped with the odor and the bleeding persists. Has tried loestrin and slynd, but this did not help.   The following portions of the patient's history were reviewed and updated as appropriate: allergies, current medications, past family history, past medical history, past social history, past surgical history, and problem list.  Review of Systems Pertinent items noted in HPI and remainder of comprehensive ROS otherwise negative.   Objective:  Chaperone present for exam   BP 126/79   Pulse (!) 103   Ht 5' 6.5" (1.689 m)   Wt 142 lb (64.4 kg)   BMI 22.58 kg/m  General appearance: alert, cooperative, and appears stated age Head: Normocephalic, without obvious abnormality, atraumatic Neck: no adenopathy, supple, symmetrical, trachea midline, and thyroid not enlarged, symmetric, no tenderness/mass/nodules Lungs: clear to auscultation bilaterally Breasts: normal appearance, no masses or tenderness Heart: regular rate and rhythm, S1, S2 normal, no murmur, click, rub or gallop Abdomen: soft, non-tender; bowel sounds normal; no masses,  no organomegaly Pelvic: cervix normal in appearance, external genitalia normal, no adnexal masses or tenderness, no cervical motion tenderness, and vagina normal without discharge, uterus is firm, globular Extremities: extremities normal, atraumatic, no cyanosis or edema Pulses: 2+ and symmetric Skin: Skin color, texture, turgor normal. No rashes or lesions Lymph nodes: Cervical, supraclavicular, and axillary nodes normal. Neurologic: Grossly normal    Assessment:    Healthy female exam.      Plan:     See After Visit Summary for Counseling Recommendations

## 2022-10-16 NOTE — Progress Notes (Signed)
Patient presents for Annual.  LMP:10/12/22 still bleeding today Last pap:08/15/21 Contraception:None  Mammogram:02/06/2022 STD Screening: [place].   CC: Irregular vaginal bleeding.   Wants refill on Trazodone  Fun Fact: Pt is currently redecorating her home.

## 2022-10-16 NOTE — Patient Instructions (Signed)

## 2022-10-17 LAB — HIV ANTIBODY (ROUTINE TESTING W REFLEX): HIV Screen 4th Generation wRfx: NONREACTIVE

## 2022-10-17 LAB — HEPATITIS C ANTIBODY: Hep C Virus Ab: NONREACTIVE

## 2022-10-17 LAB — RPR: RPR Ser Ql: NONREACTIVE

## 2022-10-17 LAB — HEPATITIS B SURFACE ANTIGEN: Hepatitis B Surface Ag: NEGATIVE

## 2022-10-22 ENCOUNTER — Ambulatory Visit: Payer: Managed Care, Other (non HMO)

## 2022-10-31 LAB — IGP,CTNGTV,APT HPV,RFX16/18,45
Chlamydia, Nuc. Acid Amp: NEGATIVE
Gonococcus, Nuc. Acid Amp: NEGATIVE
HPV Aptima: NEGATIVE
PAP Smear Comment: 0
Trich vag by NAA: NEGATIVE

## 2022-10-31 LAB — IGP,APTIMA HPV AGE GDLN,CTNGTV

## 2022-11-06 ENCOUNTER — Other Ambulatory Visit (HOSPITAL_COMMUNITY)
Admission: RE | Admit: 2022-11-06 | Discharge: 2022-11-06 | Disposition: A | Discharge status: Home / Self Care | Payer: Managed Care, Other (non HMO) | Source: Ambulatory Visit | Attending: Obstetrics & Gynecology | Admitting: Obstetrics & Gynecology

## 2022-11-06 ENCOUNTER — Ambulatory Visit (INDEPENDENT_AMBULATORY_CARE_PROVIDER_SITE_OTHER): Payer: Managed Care, Other (non HMO)

## 2022-11-06 DIAGNOSIS — N898 Other specified noninflammatory disorders of vagina: Secondary | ICD-10-CM | POA: Insufficient documentation

## 2022-11-06 LAB — POCT URINALYSIS DIPSTICK
Blood, UA: NEGATIVE
Glucose, UA: NEGATIVE
Leukocytes, UA: NEGATIVE

## 2022-11-06 NOTE — Progress Notes (Signed)
SUBJECTIVE:  42 y.o. female complains of white, creamy, and curd-like vaginal discharge and is requesting screening for UTI and STDs.  Denies abnormal vaginal bleeding or significant pelvic pain or fever.Denies history of known exposure to STD.  No LMP recorded.  OBJECTIVE:  She appears alert, well appearing, in no apparent distress Urine dipstick: negative for all components.  ASSESSMENT:  Vaginal Discharge  Vaginal Odor    PLAN:  GC, chlamydia, trichomonas, BVAG, CVAG probe and urine culture sent to lab. Treatment: To be determined once lab results are received ROV prn if symptoms persist or worsen.

## 2022-11-07 ENCOUNTER — Other Ambulatory Visit: Payer: Self-pay | Admitting: Obstetrics and Gynecology

## 2022-11-07 DIAGNOSIS — N760 Acute vaginitis: Secondary | ICD-10-CM

## 2022-11-07 DIAGNOSIS — B3731 Acute candidiasis of vulva and vagina: Secondary | ICD-10-CM

## 2022-11-07 LAB — CERVICOVAGINAL ANCILLARY ONLY
Bacterial Vaginitis (gardnerella): POSITIVE — AB
Candida Glabrata: NEGATIVE
Candida Vaginitis: POSITIVE — AB
Chlamydia: NEGATIVE
Comment: NEGATIVE
Comment: NEGATIVE
Comment: NEGATIVE
Comment: NEGATIVE
Comment: NEGATIVE
Comment: NORMAL
Neisseria Gonorrhea: NEGATIVE
Trichomonas: NEGATIVE

## 2022-11-07 MED ORDER — FLUCONAZOLE 150 MG PO TABS: 150.0000 mg | ORAL_TABLET | ORAL | 3 refills | Status: DC

## 2022-11-07 MED ORDER — METRONIDAZOLE 500 MG PO TABS: 500.0000 mg | ORAL_TABLET | Freq: Two times a day (BID) | ORAL | 0 refills | Status: DC

## 2022-11-08 ENCOUNTER — Encounter: Payer: Self-pay | Admitting: Family Medicine

## 2022-11-08 ENCOUNTER — Other Ambulatory Visit: Payer: Self-pay | Admitting: Obstetrics and Gynecology

## 2022-11-08 DIAGNOSIS — N3000 Acute cystitis without hematuria: Secondary | ICD-10-CM

## 2022-11-08 LAB — URINE CULTURE

## 2022-11-08 MED ORDER — CEFADROXIL 500 MG PO CAPS: 500.0000 mg | ORAL_CAPSULE | Freq: Two times a day (BID) | ORAL | 0 refills | Status: DC

## 2022-11-11 ENCOUNTER — Ambulatory Visit: Payer: Managed Care, Other (non HMO)

## 2022-12-02 ENCOUNTER — Ambulatory Visit
Admission: RE | Admit: 2022-12-02 | Discharge: 2022-12-02 | Disposition: A | Discharge status: Home / Self Care | Payer: Managed Care, Other (non HMO) | Source: Ambulatory Visit | Attending: Family Medicine | Admitting: Family Medicine

## 2022-12-02 DIAGNOSIS — N939 Abnormal uterine and vaginal bleeding, unspecified: Secondary | ICD-10-CM

## 2022-12-11 ENCOUNTER — Ambulatory Visit
Admission: RE | Admit: 2022-12-11 | Discharge: 2022-12-11 | Disposition: A | Discharge status: Home / Self Care | Payer: Managed Care, Other (non HMO) | Source: Ambulatory Visit | Attending: Family Medicine | Admitting: Family Medicine

## 2022-12-11 DIAGNOSIS — Z1231 Encounter for screening mammogram for malignant neoplasm of breast: Secondary | ICD-10-CM | POA: Diagnosis present

## 2022-12-13 ENCOUNTER — Ambulatory Visit: Payer: Managed Care, Other (non HMO) | Admitting: Family Medicine

## 2022-12-13 ENCOUNTER — Encounter: Payer: Self-pay | Admitting: Family Medicine

## 2022-12-13 VITALS — BP 105/77 | HR 72 | Wt 144.0 lb

## 2022-12-13 DIAGNOSIS — R350 Frequency of micturition: Secondary | ICD-10-CM | POA: Diagnosis not present

## 2022-12-13 DIAGNOSIS — N939 Abnormal uterine and vaginal bleeding, unspecified: Secondary | ICD-10-CM | POA: Insufficient documentation

## 2022-12-13 DIAGNOSIS — Z30430 Encounter for insertion of intrauterine contraceptive device: Secondary | ICD-10-CM

## 2022-12-13 DIAGNOSIS — Z124 Encounter for screening for malignant neoplasm of cervix: Secondary | ICD-10-CM

## 2022-12-13 LAB — POCT URINALYSIS DIPSTICK
Blood, UA: NEGATIVE
Glucose, UA: NEGATIVE
Ketones, UA: NEGATIVE
Leukocytes, UA: NEGATIVE

## 2022-12-13 MED ORDER — LEVONORGESTREL 20 MCG/DAY IU IUD
1.0000 | INTRAUTERINE_SYSTEM | Freq: Once | INTRAUTERINE | Status: AC
Start: 2022-12-13 — End: 2022-12-13
  Administered 2022-12-13: 1 via INTRAUTERINE

## 2022-12-13 MED ORDER — LEVONORGESTREL 20 MCG/DAY IU IUD
1.0000 | INTRAUTERINE_SYSTEM | Freq: Once | INTRAUTERINE | 0 refills | Status: DC
Start: 2022-12-13 — End: 2022-12-13

## 2022-12-13 NOTE — Progress Notes (Signed)
Hi Jacquelinne  Normal mammogram; repeat in 1 year.  Please let us know if you have any questions.  Thank you,  Alyaan Budzynski, FNP 

## 2022-12-13 NOTE — Assessment & Plan Note (Signed)
On u/s has several small fibroids, likely the cause. Has failed several OCPs, offered daily Aygestin, endometrial ablation and hysterectomy or IUD. She decided to try IUD.

## 2022-12-13 NOTE — Progress Notes (Signed)
   Subjective:    Patient ID: Victoria [name] is a 42 y.o. female presenting with Repeat Pap   on 12/13/2022  HPI: Has had some bleeding. Pelvic sonogram shows fibroids. Has been on doxy and loestrin and slynd and is still bleeding. Pap done was unreadable due to bleeding  Review of Systems  Constitutional:  Negative for chills and fever.  Respiratory:  Negative for shortness of breath.   Cardiovascular:  Negative for chest pain.  Gastrointestinal:  Negative for abdominal pain, nausea and vomiting.  Genitourinary:  Negative for dysuria.  Skin:  Negative for rash.      Objective:    BP 105/77   Pulse 72   Wt 144 lb (65.3 kg)   BMI 22.89 kg/m  Physical Exam Exam conducted with a chaperone present.  Constitutional:      General: She is not in acute distress.    Appearance: She is well-developed.  HENT:     Head: Normocephalic and atraumatic.  Eyes:     General: No scleral icterus. Cardiovascular:     Rate and Rhythm: Normal rate.  Pulmonary:     Effort: Pulmonary effort is normal.  Abdominal:     Palpations: Abdomen is soft.  Genitourinary:    Comments: BUS normal, vagina is pink and rugated, cervix is parous without lesion Musculoskeletal:     Cervical back: Neck supple.  Skin:    General: Skin is warm and dry.  Neurological:     Mental Status: She is alert and oriented to person, place, and time.    Procedure: Patient identified, informed consent performed, signed copy in chart, time out was performed.   Speculum placed in the vagina.  Cervix visualized.  Cleaned with Betadine x 2.  Grasped anteriourly with a single tooth tenaculum.  Os finder used. Opening was quite vertical and anterior and multiple attempt needed with subsequent bending of the IUD. Uterus sounded to 9 cm.  Mirena IUD placed per manufacturer's recommendations.  Strings trimmed to 3 cm.   Patient given post procedure instructions and Mirena care card with expiration date.       Assessment &  Plan:   Problem List Items Addressed This Visit       Unprioritized   Abnormal uterine bleeding    On u/s has several small fibroids, likely the cause. Has failed several OCPs, offered daily Aygestin, endometrial ablation and hysterectomy or IUD. She decided to try IUD.       Other Visit Diagnoses     Screening for cervical cancer    -  Primary   Relevant Orders   IGP, Aptima HPV, rfx 16/18,45   Urinary frequency       Relevant Orders   POCT Urinalysis Dipstick (Completed)        Return in about 4 weeks (around 01/10/2023) for iud check.  Reva Bores, MD 12/13/2022 10:48 AM

## 2022-12-13 NOTE — Progress Notes (Signed)
CC: Urinary frequency  Repeat pap, U/S results     Decline STD screenings

## 2022-12-21 LAB — IGP, APTIMA HPV, RFX 16/18,45
HPV Aptima: POSITIVE — AB
PAP Smear Comment: 0

## 2022-12-21 LAB — HPV GENOTYPES 16/18,45
HPV Genotype 16: NEGATIVE
HPV Genotype 18,45: NEGATIVE

## 2022-12-21 LAB — SPECIMEN STATUS REPORT

## 2022-12-24 ENCOUNTER — Encounter: Payer: Self-pay | Admitting: Family Medicine

## 2022-12-25 ENCOUNTER — Ambulatory Visit: Payer: Managed Care, Other (non HMO) | Admitting: Family Medicine

## 2023-01-23 ENCOUNTER — Encounter: Payer: Self-pay | Admitting: Family Medicine

## 2023-01-23 ENCOUNTER — Ambulatory Visit: Payer: Managed Care, Other (non HMO) | Admitting: Family Medicine

## 2023-01-23 VITALS — BP 122/77 | HR 62

## 2023-01-23 DIAGNOSIS — Z30431 Encounter for routine checking of intrauterine contraceptive device: Secondary | ICD-10-CM

## 2023-01-23 NOTE — Progress Notes (Signed)
   Subjective:    Patient ID: Victoria [name] is a 42 y.o. female presenting with No chief complaint on file.  on 01/23/2023  HPI: IUD placed 12/13/2022. She has had some acne and on-going bleeding including post intercourse and she reports vaginal odor. Using boric acid after intercourse to prevent BV.  Review of Systems  Constitutional:  Negative for chills and fever.  Respiratory:  Negative for shortness of breath.   Cardiovascular:  Negative for chest pain.  Gastrointestinal:  Negative for abdominal pain, nausea and vomiting.  Genitourinary:  Positive for vaginal bleeding. Negative for dysuria.  Skin:  Negative for rash.      Objective:    BP 122/77   Pulse 62  Physical Exam Exam conducted with a chaperone present.  Constitutional:      General: She is not in acute distress.    Appearance: She is well-developed.  HENT:     Head: Normocephalic and atraumatic.  Eyes:     General: No scleral icterus. Cardiovascular:     Rate and Rhythm: Normal rate.  Pulmonary:     Effort: Pulmonary effort is normal.  Abdominal:     Palpations: Abdomen is soft.  Genitourinary:    Comments: BUS normal, vagina is pink and rugated, cervix is parous without lesion, IUD strings are visible  Musculoskeletal:     Cervical back: Neck supple.  Skin:    General: Skin is warm and dry.  Neurological:     Mental Status: She is alert and oriented to person, place, and time.         Assessment & Plan:  IUD check up - IUD in place. Sample from office of Tyblume given to help curb her bleeding. if bleeding following intercourse, can tuck strings in.  Return if symptoms worsen or fail to improve.  Reva Bores, MD 01/23/2023 11:34 AM

## 2023-01-23 NOTE — Progress Notes (Signed)
Medication Samples have been provided to the patient.  Drug name: Tyblume       Strength: 0.1mg /0.2mg         Qty: 28  LOT: [id number]  Exp.Date: 05/2023  Dosing instructions: take one tablet daily  The patient has been instructed regarding the correct time, dose, and frequency of taking this medication, including desired effects and most common side effects.   Scheryl Marten 2:07 PM 01/23/2023

## 2023-02-28 ENCOUNTER — Encounter: Payer: Self-pay | Admitting: Family Medicine

## 2023-02-28 ENCOUNTER — Other Ambulatory Visit: Payer: Self-pay | Admitting: *Deleted

## 2023-02-28 MED ORDER — METRONIDAZOLE 500 MG PO TABS: 500.0000 mg | ORAL_TABLET | Freq: Two times a day (BID) | ORAL | 0 refills | Status: AC

## 2023-03-28 ENCOUNTER — Encounter: Payer: Self-pay | Admitting: Family Medicine

## 2023-04-01 IMAGING — MG MM DIGITAL DIAGNOSTIC UNILAT*L* W/ TOMO W/ CAD
6 series · 6 of 18 positions shown · non-contrast
Comparison: Previous exam(s).

CLINICAL DATA: BI-RADS 3 follow-up of LEFT breast asymmetry which
was less conspicuous on prior diagnostic workup in July 2021

EXAM:
DIGITAL DIAGNOSTIC UNILATERAL LEFT MAMMOGRAM WITH TOMOSYNTHESIS AND
CAD
TECHNIQUE: Left digital diagnostic mammography and breast tomosynthesis was
performed. The images were evaluated with computer-aided detection.

[L XCCL synth-2D]
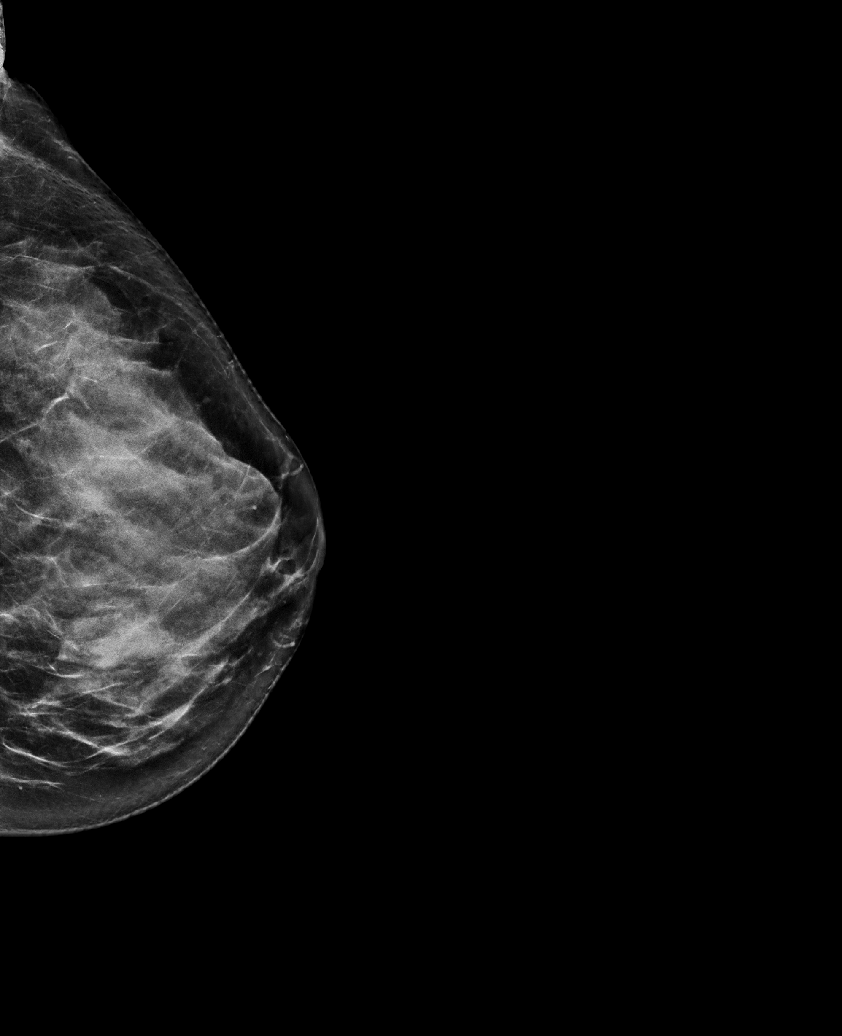

[L CC synth-2D]
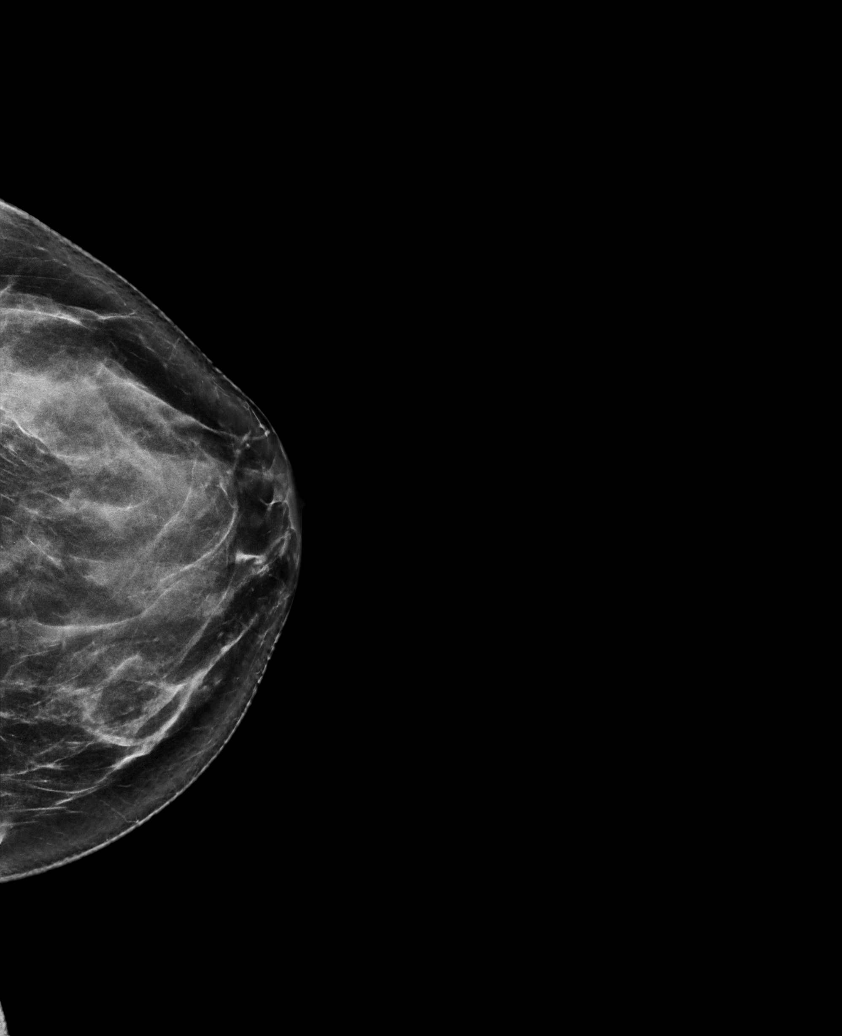

[L MLO synth-2D]
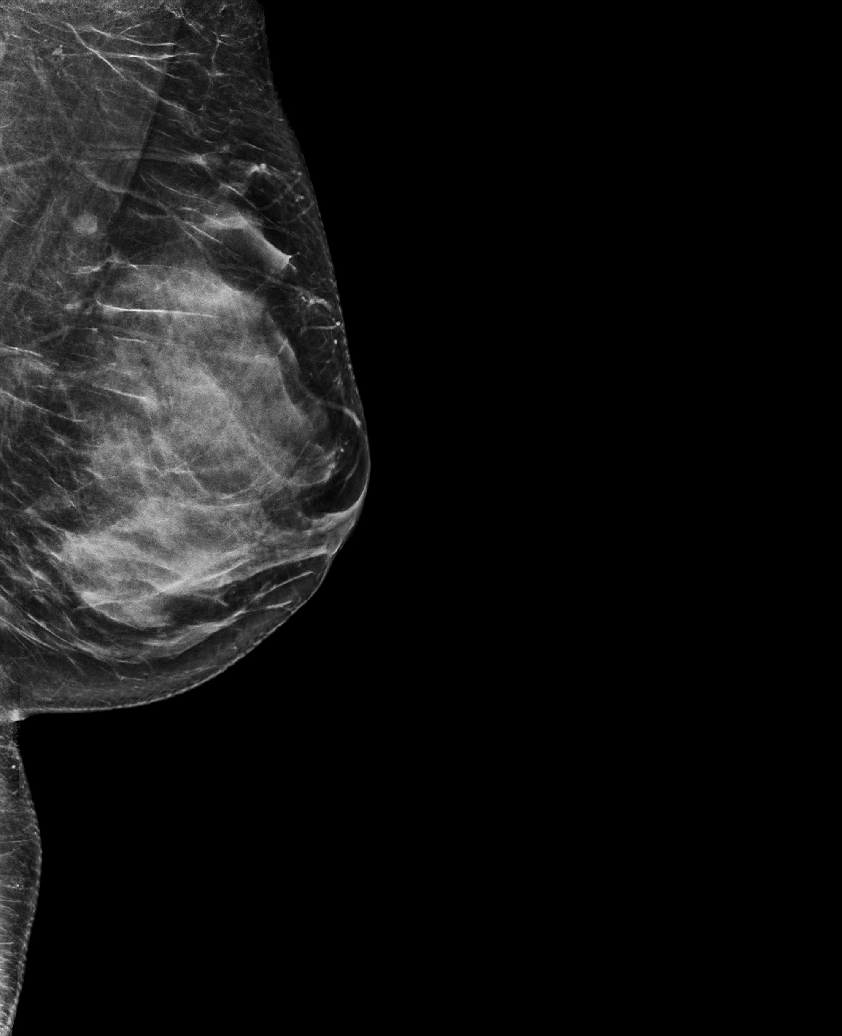

[L MLO tomo · tomo slice 37/72.0]
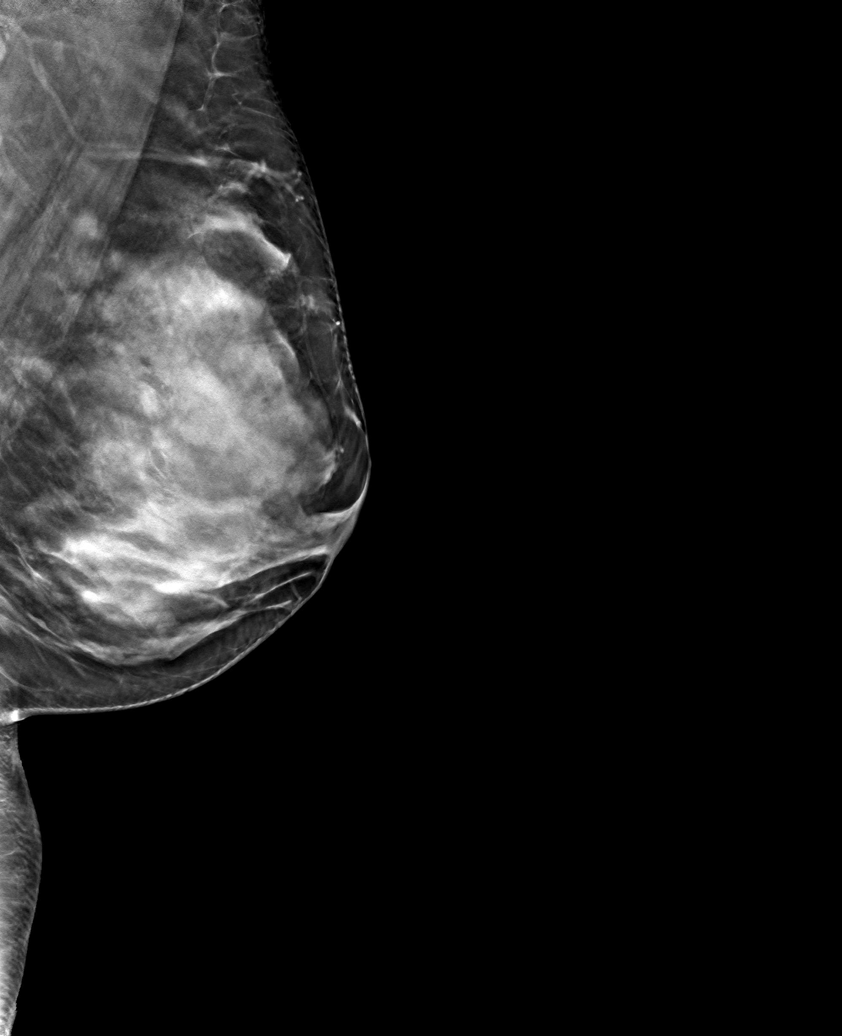

[L XCCL tomo · tomo slice 37/73.0]
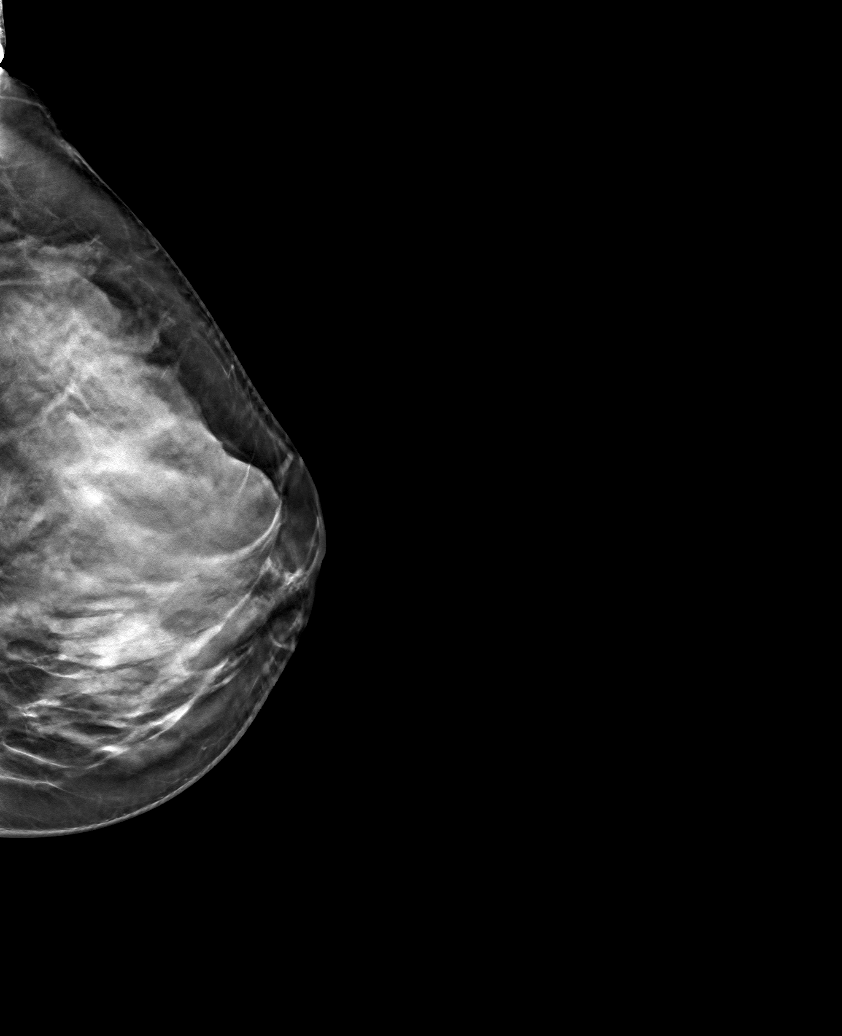

[L CC tomo · tomo slice 37/72.0]
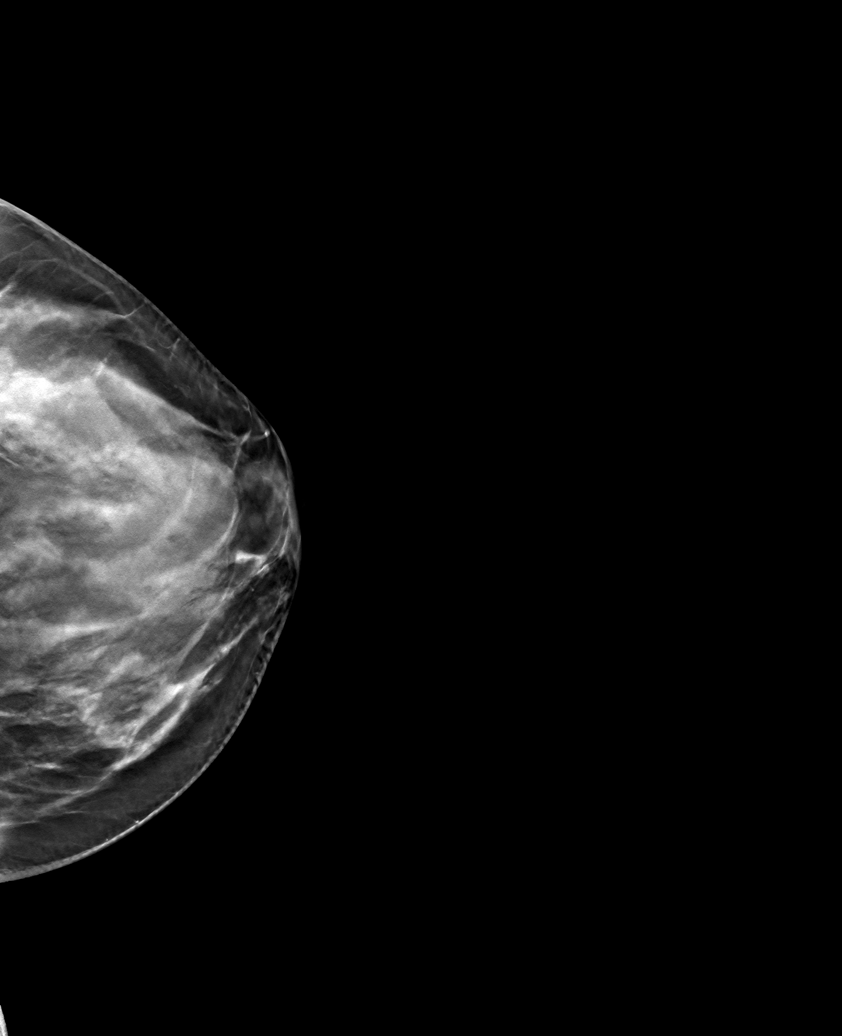

[6 of 18 positions shown; findings below may reference images not displayed]

ACR Breast Density Category d: The breast tissue is extremely dense,
which lowers the sensitivity of mammography.
FINDINGS: Previously described asymmetry in the LEFT upper outer breast at
posterior depth is no longer visualized. Fibroglandular tissue
assumes a configuration stable in comparison to prior mammogram from
9298. This is consistent with overlapping dense fibroglandular
tissue. No suspicious mass, distortion, or microcalcifications are
identified to suggest presence of malignancy.
IMPRESSION: No mammographic evidence of malignancy in the LEFT breast. Recommend
return to annual screening mammography, due July 2022.

RECOMMENDATION:
Annual screening mammogram, due July 2022

I have discussed the findings and recommendations with the patient.
If applicable, a reminder letter will be sent to the patient
regarding the next appointment.

BI-RADS CATEGORY  2: Benign.

## 2023-04-04 ENCOUNTER — Other Ambulatory Visit: Payer: Self-pay | Admitting: Family Medicine

## 2023-04-04 DIAGNOSIS — F419 Anxiety disorder, unspecified: Secondary | ICD-10-CM

## 2023-04-07 ENCOUNTER — Other Ambulatory Visit: Payer: Self-pay | Admitting: Family Medicine

## 2023-04-08 ENCOUNTER — Other Ambulatory Visit: Payer: Self-pay | Admitting: Family Medicine

## 2023-04-08 NOTE — Telephone Encounter (Signed)
Medication Refill - Medication:  hydrOXYzine (ATARAX) 10 MG tablet Not completely out yet, needs a refill up until Physical in September   Requesting 30 day Supply   Has the patient contacted their pharmacy? Yes advised to contact PCP  Preferred Pharmacy (with phone number or street name):  [facility] [phone] [place], [place] - [address]  Phone: [phone] Fax: [phone]  Has the patient been seen for an appointment in the last year OR does the patient have an upcoming appointment? YES. Physical scheduled for 05/13/2023

## 2023-04-08 NOTE — Telephone Encounter (Signed)
Requesting 30 day supply

## 2023-04-09 MED ORDER — HYDROXYZINE HCL 10 MG PO TABS: 10.0000 mg | ORAL_TABLET | Freq: Four times a day (QID) | ORAL | 0 refills | Status: DC | PRN

## 2023-04-09 NOTE — Telephone Encounter (Signed)
Requested Prescriptions  Pending Prescriptions Disp Refills   hydrOXYzine (ATARAX) 10 MG tablet 360 tablet 3    Sig: Take 1 tablet (10 mg total) by mouth 4 (four) times daily as needed.     Ear, Nose, and Throat:  Antihistamines 2 Passed - 04/08/2023 10:10 AM      Passed - Cr in normal range and within 360 days    Creatinine, Ser  Date Value Ref Range Status  05/08/2022 0.80 0.57 - 1.00 mg/dL Final         Passed - Valid encounter within last 12 months    Recent Outpatient Visits           9 months ago Left knee pain, unspecified chronicity   [facility] [place], [place], PA-C   11 months ago Annual physical exam   [facility] Merita Norton T, FNP   1 year ago Encounter for preventive health examination   [facility] Merita Norton T, FNP   2 years ago Gastroesophageal reflux disease, unspecified whether esophagitis present   [facility] [place], Eula Fried, FNP   3 years ago Sore throat   [facility] [place], Alessandra Bevels, [place]       Future Appointments             In 1 month Suzie Portela, Daryl Eastern, FNP [facility]

## 2023-04-30 ENCOUNTER — Other Ambulatory Visit: Payer: Self-pay | Admitting: Family Medicine

## 2023-04-30 DIAGNOSIS — F419 Anxiety disorder, unspecified: Secondary | ICD-10-CM

## 2023-05-13 ENCOUNTER — Encounter: Payer: Managed Care, Other (non HMO) | Admitting: Family Medicine

## 2023-05-15 ENCOUNTER — Ambulatory Visit (INDEPENDENT_AMBULATORY_CARE_PROVIDER_SITE_OTHER): Payer: Managed Care, Other (non HMO) | Admitting: Family Medicine

## 2023-05-15 VITALS — BP 103/71 | HR 85 | Ht 66.5 in | Wt 142.2 lb

## 2023-05-15 DIAGNOSIS — Z23 Encounter for immunization: Secondary | ICD-10-CM

## 2023-05-15 DIAGNOSIS — Z9189 Other specified personal risk factors, not elsewhere classified: Secondary | ICD-10-CM

## 2023-05-15 DIAGNOSIS — F5102 Adjustment insomnia: Secondary | ICD-10-CM

## 2023-05-15 DIAGNOSIS — L700 Acne vulgaris: Secondary | ICD-10-CM | POA: Diagnosis not present

## 2023-05-15 DIAGNOSIS — N393 Stress incontinence (female) (male): Secondary | ICD-10-CM | POA: Insufficient documentation

## 2023-05-15 DIAGNOSIS — Z0001 Encounter for general adult medical examination with abnormal findings: Secondary | ICD-10-CM

## 2023-05-15 DIAGNOSIS — Z0000 Encounter for general adult medical examination without abnormal findings: Secondary | ICD-10-CM

## 2023-05-15 DIAGNOSIS — R32 Unspecified urinary incontinence: Secondary | ICD-10-CM

## 2023-05-15 DIAGNOSIS — R7309 Other abnormal glucose: Secondary | ICD-10-CM

## 2023-05-15 MED ORDER — DOXYCYCLINE HYCLATE 100 MG PO TABS
100.0000 mg | ORAL_TABLET | Freq: Every day | ORAL | 2 refills | Status: DC
Start: 2023-05-15 — End: 2023-09-11

## 2023-05-15 MED ORDER — ZOLPIDEM TARTRATE ER 6.25 MG PO TBCR: 6.2500 mg | EXTENDED_RELEASE_TABLET | Freq: Every evening | ORAL | 0 refills | Status: AC | PRN

## 2023-05-15 NOTE — Assessment & Plan Note (Signed)
Acute on chronic, worsening since IUD placement Recommend doxy to assist Continue to monitor

## 2023-05-15 NOTE — Assessment & Plan Note (Signed)
Acute on chronic, worsening Failed Rx medication Referral to surgery to assist given ongoing concerns and inability to exercise (including hitting golf balls without loss of bladder function)

## 2023-05-15 NOTE — Assessment & Plan Note (Signed)
Acute on chronic, failed trazodone Trial of CR Ambien to assist F/u in 1 month

## 2023-05-15 NOTE — Progress Notes (Signed)
Complete physical exam  [name]   DOB: 08/31/1980   42 y.o. Female  MRN: [id number] Visit Date: 05/15/2023  Today's healthcare provider: Jacky Kindle, FNP  Re-introduced to nurse practitioner role and practice setting.  All questions answered.  Discussed provider/patient relationship and expectations.  Chief Complaint  Patient presents with   Annual Exam    Diet - General, well balanced Exercise - walking daily at least 20 minutes and 20 minute workouts on occasions Feeling well Sleeping fairly well Concerns - pain in right knee X 3-4 weeks and started after patient began walking more. Patient would also like to have STI/STD screening due to her and spouse separating    Subjective     HPI HPI     Annual Exam    Additional comments: Diet - General, well balanced Exercise - walking daily at least 20 minutes and 20 minute workouts on occasions Feeling well Sleeping fairly well Concerns - pain in right knee X 3-4 weeks and started after patient began walking more. Patient would also like to have STI/STD screening due to her and spouse separating       Last edited by Acey Lav, CMA on 05/15/2023  3:51 PM.      Past Medical History:  Diagnosis Date   Abnormal Pap smear of cervix    Calculus of kidney    left distal ureteral stone   Dyspareunia    Past Surgical History:  Procedure Laterality Date   CRYOTHERAPY     AGE 35   LITHOTRIPSY     TUBAL LIGATION     Social History   Socioeconomic History   Marital status: Legally Separated    Spouse name: Not on file   Number of children: 2   Years of education: Not on file   Highest education level: Some college, no degree  Occupational History   Not on file  Tobacco Use   Smoking status: Former    Current packs/day: 0.00    Average packs/day: 0.5 packs/day for 6.0 years (3.0 ttl pk-yrs)    Types: Cigarettes    Quit date: 08/19/2004    Years since quitting: 18.7   Smokeless tobacco: Never    Tobacco comments:    Previous social smoking- maybe 5 cigs/week  Vaping Use   Vaping status: Never Used  Substance and Sexual Activity   Alcohol use: Yes    Comment: OCCASIONALLY   Drug use: No   Sexual activity: Not Currently    Partners: Male    Birth control/protection: Surgical    Comment: tubaligation.  Other Topics Concern   Not on file  Social History Narrative   Not on file   Social Determinants of Health   Financial Resource Strain: Low Risk  (05/15/2023)   Overall Financial Resource Strain (CARDIA)    Difficulty of Paying Living Expenses: Not very hard  Food Insecurity: No Food Insecurity (05/15/2023)   Hunger Vital Sign    Worried About Running Out of Food in the Last Year: Never true    Ran Out of Food in the Last Year: Never true  Transportation Needs: No Transportation Needs (05/15/2023)   PRAPARE - Administrator, Civil Service (Medical): No    Lack of Transportation (Non-Medical): No  Physical Activity: Insufficiently Active (05/15/2023)   Exercise Vital Sign    Days of Exercise per Week: 5 days    Minutes of Exercise per Session: 20 min  Stress: Stress Concern Present (  05/15/2023)   [place] [employer] - Occupational Stress Questionnaire    Feeling of Stress : To some extent  Social Connections: Moderately Isolated (05/15/2023)   Social Connection and Isolation Panel [NHANES]    Frequency of Communication with Friends and Family: More than three times a week    Frequency of Social Gatherings with Friends and Family: Once a week    Attends Religious Services: 1 to 4 times per year    Active Member of [employer] or Organizations: No    Attends Engineer, structural: Not on file    Marital Status: Separated  Intimate Partner Violence: Not on file   Family Status  Relation Name Status   [employer] developer porter Deceased   PGF Molly Maduro moore Deceased   PGM Britta Mccreedy moore Deceased   Brother [employer] [employer] copy Alive   Mother   Alive   Father Jillyn Hidden moore Deceased   Brother  Alive   Daughter  Alive   Son  Alive  No partnership data on file   Family History  Problem Relation Age of Onset   Breast cancer Maternal Aunt        1/2    Cancer Maternal Aunt        breast   Heart attack Paternal Grandfather    Heart disease Paternal Grandfather        heart atack   Heart attack Paternal Grandmother    Heart disease Paternal Grandmother        heart attack   Hypertension Brother    Alcohol abuse Brother    Pancreatitis Brother    Alcohol abuse Father    Allergies  Allergen Reactions   Bactrim Other (See Comments)    Dizziness    Sulfa Antibiotics Other (See Comments)    dizziness    Patient Care Team: Jacky Kindle, FNP as PCP - General (Family Medicine)   Medications: Outpatient Medications Prior to Visit  Medication Sig   busPIRone (BUSPAR) 7.5 MG tablet TAKE 1 TABLET BY MOUTH 3 TIMES DAILY.   hydrOXYzine (ATARAX) 10 MG tablet Take 1 tablet (10 mg total) by mouth 4 (four) times daily as needed.   traZODone (DESYREL) 50 MG tablet Take 0.5-1 tablets (25-50 mg total) by mouth at bedtime as needed. for sleep   No facility-administered medications prior to visit.    Objective    BP 103/71 (BP Location: Left Arm, Patient Position: Sitting, Cuff Size: Normal)   Pulse 85   Ht 5' 6.5" (1.689 m)   Wt 142 lb 3.2 oz (64.5 kg)   LMP 05/11/2023   SpO2 100%   BMI 22.61 kg/m   Physical Exam Vitals and nursing note reviewed.  Constitutional:      General: She is awake. She is not in acute distress.    Appearance: Normal appearance. She is well-developed, well-groomed and normal weight. She is not ill-appearing, toxic-appearing or diaphoretic.  HENT:     Head: Normocephalic and atraumatic.     Jaw: There is normal jaw occlusion. No trismus, tenderness, swelling or pain on movement.     Right Ear: Hearing, tympanic membrane, ear canal and external ear normal. There is no impacted cerumen.     Left Ear:  Hearing, tympanic membrane, ear canal and external ear normal. There is no impacted cerumen.     Nose: Nose normal. No congestion or rhinorrhea.     Right Turbinates: Not enlarged, swollen or pale.     Left Turbinates: Not  enlarged, swollen or pale.     Right Sinus: No maxillary sinus tenderness or frontal sinus tenderness.     Left Sinus: No maxillary sinus tenderness or frontal sinus tenderness.     Mouth/Throat:     Lips: Pink.     Mouth: Mucous membranes are moist. No injury.     Tongue: No lesions.     Pharynx: Oropharynx is clear. Uvula midline. No pharyngeal swelling, oropharyngeal exudate, posterior oropharyngeal erythema or uvula swelling.     Tonsils: No tonsillar exudate or tonsillar abscesses.  Eyes:     General: Lids are normal. Lids are everted, no foreign bodies appreciated. Vision grossly intact. Gaze aligned appropriately. No allergic shiner or visual field deficit.       Right eye: No discharge.        Left eye: No discharge.     Extraocular Movements: Extraocular movements intact.     Conjunctiva/sclera: Conjunctivae normal.     Right eye: Right conjunctiva is not injected. No exudate.    Left eye: Left conjunctiva is not injected. No exudate.    Pupils: Pupils are equal, round, and reactive to light.  Neck:     Thyroid: No thyroid mass, thyromegaly or thyroid tenderness.     Vascular: No carotid bruit.     Trachea: Trachea normal.  Cardiovascular:     Rate and Rhythm: Normal rate and regular rhythm.     Pulses: Normal pulses.          Carotid pulses are 2+ on the right side and 2+ on the left side.      Radial pulses are 2+ on the right side and 2+ on the left side.       Dorsalis pedis pulses are 2+ on the right side and 2+ on the left side.       Posterior tibial pulses are 2+ on the right side and 2+ on the left side.     Heart sounds: Normal heart sounds, S1 normal and S2 normal. No murmur heard.    No friction rub. No gallop.  Pulmonary:     Effort:  Pulmonary effort is normal. No respiratory distress.     Breath sounds: Normal breath sounds and air entry. No stridor. No wheezing, rhonchi or rales.  Chest:     Chest wall: No tenderness.  Abdominal:     General: Abdomen is flat. Bowel sounds are normal. There is no distension.     Palpations: Abdomen is soft. There is no mass.     Tenderness: There is no abdominal tenderness. There is no right CVA tenderness, left CVA tenderness, guarding or rebound.     Hernia: No hernia is present.  Genitourinary:    Comments: Exam deferred; complaints of bladder leakage and overactive bladder with incontinence  Musculoskeletal:        General: No swelling, tenderness, deformity or signs of injury. Normal range of motion.     Cervical back: Full passive range of motion without pain, normal range of motion and neck supple. No edema, rigidity or tenderness. No muscular tenderness.     Right lower leg: No edema.     Left lower leg: No edema.  Lymphadenopathy:     Cervical: No cervical adenopathy.     Right cervical: No superficial, deep or posterior cervical adenopathy.    Left cervical: No superficial, deep or posterior cervical adenopathy.  Skin:    General: Skin is warm and dry.     Capillary Refill: Capillary refill  takes less than 2 seconds.     Coloration: Skin is not jaundiced or pale.     Findings: No bruising, erythema, lesion or rash.  Neurological:     General: No focal deficit present.     Mental Status: She is alert and oriented to person, place, and time. Mental status is at baseline.     GCS: GCS eye subscore is 4. GCS verbal subscore is 5. GCS motor subscore is 6.     Sensory: Sensation is intact. No sensory deficit.     Motor: Motor function is intact. No weakness.     Coordination: Coordination is intact. Coordination normal.     Gait: Gait is intact. Gait normal.  Psychiatric:        Attention and Perception: Attention and perception normal.        Mood and Affect: Mood and  affect normal.        Speech: Speech normal.        Behavior: Behavior normal. Behavior is cooperative.        Thought Content: Thought content normal.        Cognition and Memory: Cognition and memory normal.        Judgment: Judgment normal.     Last depression screening scores    05/08/2022    3:00 PM 04/25/2021    8:53 AM 02/17/2020    9:54 AM  PHQ 2/9 Scores  PHQ - 2 Score 2 2 0  PHQ- 9 Score 9 7 1    Last fall risk screening    05/08/2022    3:00 PM  Fall Risk   Falls in the past year? 0  Number falls in past yr: 0  Injury with Fall? 0  Risk for fall due to : No Fall Risks  Follow up Falls evaluation completed   Last Audit-C alcohol use screening    05/15/2023   11:39 AM  Alcohol Use Disorder Test (AUDIT)  1. How often do you have a drink containing alcohol? 3  2. How many drinks containing alcohol do you have on a typical day when you are drinking? 0  3. How often do you have six or more drinks on one occasion? 0  AUDIT-C Score 3  4. How often during the last year have you found that you were not able to stop drinking once you had started? 0  5. How often during the last year have you failed to do what was normally expected from you because of drinking? 0  6. How often during the last year have you needed a first drink in the morning to get yourself going after a heavy drinking session? 0  7. How often during the last year have you had a feeling of guilt of remorse after drinking? 0  8. How often during the last year have you been unable to remember what happened the night before because you had been drinking? 0  9. Have you or someone else been injured as a result of your drinking? 0  10. Has a relative or friend or a doctor or another health worker been concerned about your drinking or suggested you cut down? 0  Alcohol Use Disorder Identification Test Final Score (AUDIT) 3   A score of 3 or more in women, and 4 or more in men indicates increased risk for alcohol  abuse, EXCEPT if all of the points are from question 1   No results found for any visits on 05/15/23.  Assessment &  Plan    Routine Health Maintenance and Physical Exam  Exercise Activities and Dietary recommendations  Goals   None     Immunization History  Administered Date(s) Administered   Influenza Split 06/05/2010, 08/15/2011   Influenza, Seasonal, Injecte, Preservative Fre 05/15/2023   Influenza,inj,Quad PF,6+ Mos 04/23/2017, 06/12/2018, 05/31/2019, 06/06/2020, 08/15/2021, 05/08/2022   Tdap 06/05/2010    Health Maintenance  Topic Date Due   DTaP/Tdap/Td (2 - Td or Tdap) 06/05/2020   COVID-19 Vaccine (1 - 2023-24 season) Never done   Cervical Cancer Screening (HPV/Pap Cotest)  12/13/2027   INFLUENZA VACCINE  Completed   Hepatitis C Screening  Completed   HIV Screening  Completed   HPV VACCINES  Aged Out    Discussed health benefits of physical activity, and encouraged her to engage in regular exercise appropriate for her age and condition.  Problem List Items Addressed This Visit       Musculoskeletal and Integument   Cystic acne vulgaris    Acute on chronic, worsening since IUD placement Recommend doxy to assist Continue to monitor       Relevant Medications   doxycycline (VIBRA-TABS) 100 MG tablet     Other   Adjustment insomnia    Acute on chronic, failed trazodone Trial of CR Ambien to assist F/u in 1 month       Annual physical exam - Primary    Things to do to keep yourself healthy  - Exercise at least 30-45 minutes a day, 3-4 days a week.  - Eat a low-fat diet with lots of fruits and vegetables, up to 7-9 servings per day.  - Seatbelts can save your life. Wear them always.  - Smoke detectors on every level of your home, check batteries every year.  - Eye Doctor - have an eye exam every 1-2 years  - Safe sex - if you may be exposed to STDs, use a condom.  - Alcohol -  If you drink, do it moderately, less than 2 drinks per day.  - Health Care  Power of Attorney. Choose someone to speak for you if you are not able.  - Depression is common in our stressful world.If you're feeling down or losing interest in things you normally enjoy, please come in for a visit.  - Violence - If anyone is threatening or hurting you, please call immediately.        Relevant Orders   CBC with Differential/Platelet   Comprehensive metabolic panel   Lipid Panel With LDL/HDL Ratio   Flu vaccine trivalent PF, 6mos and older(Flulaval,Afluria,Fluarix,Fluzone) (Completed)   TSH   At risk for sexually transmitted disease due to unprotected sex    Request for STI testing given use of street drugs and unprotected sex with now legally separated spouse       Relevant Orders   HIV antibody (with reflex)   Hepatitis C Antibody   Hep B Core Ab W/Reflex   RPR   HSV 1 and 2 Ab, IgG   NuSwab Vaginitis Plus (VG+)   GC/Chlamydia Probe Amp([facility])   Elevated glucose    Repeat A1c Continue to recommend balanced, lower carb meals. Smaller meal size, adding snacks. Choosing water as drink of choice and increasing purposeful exercise.       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    Acute on chronic, worsening Failed Rx medication Referral to surgery to assist given ongoing concerns and inability to exercise (including hitting golf balls without loss  of bladder function)      Relevant Orders   Ambulatory referral to Urogynecology   Other Visit Diagnoses     Need for influenza vaccination       Relevant Orders   Flu vaccine trivalent PF, 6mos and older(Flulaval,Afluria,Fluarix,Fluzone) (Completed)   Immunization due       Relevant Orders   Flu vaccine trivalent PF, 6mos and older(Flulaval,Afluria,Fluarix,Fluzone) (Completed)      Return in about 4 weeks (around 06/12/2023), or if symptoms worsen or fail to improve.    Leilani Merl, FNP, have reviewed all documentation for this visit. The documentation on 05/15/23 for the  exam, diagnosis, procedures, and orders are all accurate and complete.  Jacky Kindle, FNP  [facility] [phone] (phone) [phone] (fax)  [facility]

## 2023-05-15 NOTE — Assessment & Plan Note (Signed)
Repeat A1c; Continue to recommend balanced, lower carb meals. Smaller meal size, adding snacks. Choosing water as drink of choice and increasing purposeful exercise.

## 2023-05-15 NOTE — Assessment & Plan Note (Signed)
Request for STI testing given use of street drugs and unprotected sex with now legally separated spouse

## 2023-05-15 NOTE — Assessment & Plan Note (Signed)

## 2023-05-16 ENCOUNTER — Encounter: Payer: Self-pay | Admitting: Family Medicine

## 2023-05-16 LAB — LIPID PANEL WITH LDL/HDL RATIO
Cholesterol, Total: 236 mg/dL — ABNORMAL HIGH (ref 100–199)
HDL: 71 mg/dL (ref >39)
LDL Chol Calc (NIH): 147 mg/dL — ABNORMAL HIGH (ref 0–99)
LDL/HDL Ratio: 2.1 {ratio} (ref 0.0–3.2)
Triglycerides: 101 mg/dL (ref 0–149)
VLDL Cholesterol Cal: 18 mg/dL (ref 5–40)

## 2023-05-16 LAB — CBC WITH DIFFERENTIAL/PLATELET
Basophils Absolute: 0 10*3/uL (ref 0.0–0.2)
Basos: 1 %
EOS (ABSOLUTE): 0.1 10*3/uL (ref 0.0–0.4)
Eos: 1 %
Hematocrit: 42.1 % (ref 34.0–46.6)
Hemoglobin: 13.5 g/dL (ref 11.1–15.9)
Immature Grans (Abs): 0 10*3/uL (ref 0.0–0.1)
Immature Granulocytes: 0 %
Lymphocytes Absolute: 2.4 10*3/uL (ref 0.7–3.1)
Lymphs: 27 %
MCH: 30.1 pg (ref 26.6–33.0)
MCHC: 32.1 g/dL (ref 31.5–35.7)
MCV: 94 fL (ref 79–97)
Monocytes Absolute: 0.5 10*3/uL (ref 0.1–0.9)
Monocytes: 6 %
Neutrophils Absolute: 5.8 10*3/uL (ref 1.4–7.0)
Neutrophils: 65 %
Platelets: 318 10*3/uL (ref 150–450)
RBC: 4.49 x10E6/uL (ref 3.77–5.28)
RDW: 12.1 % (ref 11.7–15.4)
WBC: 8.8 10*3/uL (ref 3.4–10.8)

## 2023-05-16 LAB — COMPREHENSIVE METABOLIC PANEL
ALT: 13 [IU]/L (ref 0–32)
AST: 17 [IU]/L (ref 0–40)
Albumin: 4.1 g/dL (ref 3.9–4.9)
Alkaline Phosphatase: 72 [IU]/L (ref 44–121)
BUN/Creatinine Ratio: 16 (ref 9–23)
BUN: 12 mg/dL (ref 6–24)
Bilirubin Total: 0.2 mg/dL (ref 0.0–1.2)
CO2: 23 mmol/L (ref 20–29)
Calcium: 8.8 mg/dL (ref 8.7–10.2)
Chloride: 101 mmol/L (ref 96–106)
Creatinine, Ser: 0.73 mg/dL (ref 0.57–1.00)
Globulin, Total: 2.5 g/dL (ref 1.5–4.5)
Glucose: 74 mg/dL (ref 70–99)
Potassium: 4.2 mmol/L (ref 3.5–5.2)
Sodium: 139 mmol/L (ref 134–144)
Total Protein: 6.6 g/dL (ref 6.0–8.5)
eGFR: 105 mL/min/{1.73_m2} (ref >59)

## 2023-05-16 LAB — HEPATITIS B CORE AB W/REFLEX: Hep B Core Total Ab: NEGATIVE

## 2023-05-16 LAB — TSH: TSH: 1.84 u[IU]/mL (ref 0.450–4.500)

## 2023-05-16 LAB — HEMOGLOBIN A1C
Est. average glucose Bld gHb Est-mCnc: 111 mg/dL
Hgb A1c MFr Bld: 5.5 % (ref 4.8–5.6)

## 2023-05-16 LAB — HSV 1 AND 2 AB, IGG
HSV 1 Glycoprotein G Ab, IgG: 1.84 {index} — ABNORMAL HIGH (ref 0.00–0.90)
HSV 2 IgG, Type Spec: <0.91 {index} (ref 0.00–0.90)

## 2023-05-16 LAB — HEPATITIS C ANTIBODY: Hep C Virus Ab: NONREACTIVE

## 2023-05-16 LAB — RPR: RPR Ser Ql: NONREACTIVE

## 2023-05-16 LAB — HIV ANTIBODY (ROUTINE TESTING W REFLEX): HIV Screen 4th Generation wRfx: NONREACTIVE

## 2023-05-18 ENCOUNTER — Other Ambulatory Visit: Payer: Self-pay | Admitting: Family Medicine

## 2023-05-18 LAB — NUSWAB VAGINITIS PLUS (VG+)
Atopobium vaginae: HIGH {score} — AB
Candida albicans, NAA: NEGATIVE
Candida glabrata, NAA: NEGATIVE
Chlamydia trachomatis, NAA: NEGATIVE
Neisseria gonorrhoeae, NAA: NEGATIVE
Trich vag by NAA: NEGATIVE

## 2023-05-18 LAB — SPECIMEN STATUS REPORT

## 2023-05-18 MED ORDER — METRONIDAZOLE 500 MG PO TABS: 500.0000 mg | ORAL_TABLET | Freq: Two times a day (BID) | ORAL | 0 refills | Status: AC

## 2023-05-18 NOTE — Progress Notes (Signed)
Recommend treatment for BV with Flagyl.

## 2023-05-20 LAB — GC/CHLAMYDIA PROBE AMP
Chlamydia trachomatis, NAA: NEGATIVE
Neisseria Gonorrhoeae by PCR: NEGATIVE

## 2023-05-20 LAB — SPECIMEN STATUS REPORT

## 2023-05-29 ENCOUNTER — Other Ambulatory Visit: Payer: Self-pay | Admitting: Family Medicine

## 2023-05-29 MED ORDER — PANTOPRAZOLE SODIUM 20 MG PO TBEC
20.0000 mg | DELAYED_RELEASE_TABLET | Freq: Every day | ORAL | 2 refills | Status: DC
Start: 2023-05-29 — End: 2023-07-23

## 2023-07-23 ENCOUNTER — Telehealth (INDEPENDENT_AMBULATORY_CARE_PROVIDER_SITE_OTHER): Payer: Managed Care, Other (non HMO) | Admitting: Physician Assistant

## 2023-07-23 DIAGNOSIS — K219 Gastro-esophageal reflux disease without esophagitis: Secondary | ICD-10-CM | POA: Diagnosis not present

## 2023-07-23 MED ORDER — PANTOPRAZOLE SODIUM 40 MG PO TBEC: 40.0000 mg | DELAYED_RELEASE_TABLET | Freq: Every day | ORAL | 3 refills | Status: DC

## 2023-07-23 NOTE — Progress Notes (Signed)
MyChart Video Visit  Virtual Visit via Video Note   This format is felt to be most appropriate for this patient at this time. Physical exam was limited by quality of the video and audio technology used for the visit.   Patient location: office Patient Location: Home  I discussed the limitations of evaluation and management by telemedicine and the availability of in person appointments. The patient expressed understanding and agreed to proceed.  [name]   DOB: [birth year]   42 y.o. Female  MRN: [id number] Visit Date: 07/23/2023  Today's healthcare provider: Debera Lat, PA-C   No chief complaint on file.  Subjective     Discussed the use of AI scribe software for clinical note transcription with the patient, who gave verbal consent to proceed.  History of Present Illness   The patient, with a history of heartburn and indigestion, presents with persistent symptoms despite daily antacid therapy. They describe a burning sensation in the left upper quadrant of their abdomen, which they believe is related to acid reflux. The discomfort is severe enough to wake them up at night and is not relieved by over-the-counter remedies such as Tums, milk, or ginger ale. They have noticed that certain foods, such as tomatoes and oranges, seem to trigger their symptoms. They also report that they have had to induce vomiting to relieve the discomfort, and have noticed undigested food in their vomit. They are currently taking Protonix for their symptoms, but it does not seem to be effective.         Medications: Outpatient Medications Prior to Visit  Medication Sig   busPIRone (BUSPAR) 7.5 MG tablet TAKE 1 TABLET BY MOUTH 3 TIMES DAILY.   doxycycline (VIBRA-TABS) 100 MG tablet Take 1 tablet (100 mg total) by mouth daily.   hydrOXYzine (ATARAX) 10 MG tablet Take 1 tablet (10 mg total) by mouth 4 (four) times daily as needed.   traZODone (DESYREL) 50 MG tablet Take 0.5-1 tablets (25-50  mg total) by mouth at bedtime as needed. for sleep   zolpidem (AMBIEN CR) 6.25 MG CR tablet Take 1 tablet (6.25 mg total) by mouth at bedtime as needed for sleep.   [DISCONTINUED] pantoprazole (PROTONIX) 20 MG tablet Take 1 tablet (20 mg total) by mouth daily.   No facility-administered medications prior to visit.    Review of Systems  All other systems reviewed and are negative.  Except see HPI   {Insert previous labs (optional):23779} {See past labs  Heme  Chem  Endocrine  Serology  Results Review (optional):1}   Objective    There were no vitals taken for this visit.  {Insert last BP/Wt (optional):23777}{See vitals history (optional):1}    Physical Exam Constitutional:      General: She is not in acute distress.    Appearance: Normal appearance.  HENT:     Head: Normocephalic.  Pulmonary:     Effort: Pulmonary effort is normal. No respiratory distress.  Neurological:     Mental Status: She is alert and oriented to person, place, and time. Mental status is at baseline.        Assessment & Plan    Gastroesophageal Reflux Disease (GERD) Persistent symptoms despite Protonix 20mg  daily. Symptoms are worse at night and associated with certain foods (tomatoes, oranges). Patient has tried lifestyle modifications (elevating head of bed, avoiding trigger foods) with limited success. -Increase Protonix to 40mg  daily, divided into two doses. -Continue lifestyle modifications:elevate head of bed, avoid eating 3 hours before bedtime, avoid  overeating, and avoid trigger foods. -If symptoms persist, consider imaging studies. -Follow-up in 2-4 weeks or sooner if symptoms worsen.   No follow-ups on file.    I discussed the assessment and treatment plan with the patient. The patient was provided an opportunity to ask questions and all were answered. The patient agreed with the plan and demonstrated an understanding of the instructions.   The patient was advised to call back  or seek an in-person evaluation if the symptoms worsen or if the condition fails to improve as anticipated.  I provided 11 minutes of non-face-to-face time during this encounter.  I, Debera Lat, PA-C have reviewed all documentation for this visit. The documentation on  07/23/23 for the exam, diagnosis, procedures, and orders are all accurate and complete.  Debera Lat, [facility], MMS [facility] [phone] (phone) [phone] (fax)  [facility]

## 2023-07-24 ENCOUNTER — Encounter: Payer: Self-pay | Admitting: Physician Assistant

## 2023-08-17 ENCOUNTER — Encounter: Payer: Self-pay | Admitting: Family Medicine

## 2023-08-19 ENCOUNTER — Other Ambulatory Visit: Payer: Self-pay | Admitting: Family Medicine

## 2023-08-19 DIAGNOSIS — K2100 Gastro-esophageal reflux disease with esophagitis, without bleeding: Secondary | ICD-10-CM

## 2023-08-19 MED ORDER — PANTOPRAZOLE SODIUM 40 MG PO TBEC: 40.0000 mg | DELAYED_RELEASE_TABLET | Freq: Two times a day (BID) | ORAL | 0 refills | Status: DC

## 2023-08-21 ENCOUNTER — Other Ambulatory Visit: Payer: Self-pay | Admitting: Family Medicine

## 2023-08-21 DIAGNOSIS — K2100 Gastro-esophageal reflux disease with esophagitis, without bleeding: Secondary | ICD-10-CM

## 2023-08-21 NOTE — Telephone Encounter (Signed)
**Note De-identified  [place] Obfuscation** Please advise 

## 2023-08-27 ENCOUNTER — Other Ambulatory Visit: Payer: Self-pay

## 2023-08-27 MED ORDER — PANTOPRAZOLE SODIUM 20 MG PO TBEC: 20.0000 mg | DELAYED_RELEASE_TABLET | Freq: Two times a day (BID) | ORAL | 1 refills | Status: DC

## 2023-08-29 LAB — H. PYLORI BREATH TEST: H pylori Breath Test: NEGATIVE

## 2023-08-29 LAB — H. PYLORI BREATH COLLECTION

## 2023-09-04 NOTE — Progress Notes (Signed)
Celso Amy, PA-C [address]  [address]  [place], [place] [address]  Main: [phone]  Fax: [phone]   Gastroenterology Consultation  Referring Provider:     Jacky Kindle, FNP Primary Care Physician:  Jacky Kindle, FNP (Inactive) Primary Gastroenterologist:  Celso Amy, PA-C / Dr. Wyline Mood   Reason for Consultation:     RUQ Pain, Epigastric Pain        HPI:   Victoria [name] is a 43 y.o. y/o female referred for consultation & management  by Jacky Kindle, FNP (Inactive).    Referred to evaluate Upper abdominal pain.  Patient states she has had episodes of RUQ pain intermittently for 1 year.  Episodes are becoming more frequent and severe in the past few months.  She has RUQ pain waking her up in the middle of the night from sleep.  The pain lasted a few hours and gradually subsides.  She has increased belching and gas.  It radiates to her right shoulder blade.  Also radiates to the epigastrium.  She reports early satiety.  Gets full easily after eating.  RUQ pain is a burning and sore sensation.  No improvement with antacid or PPI.  She has occasional nausea and vomiting with the pain.  Rarely takes NSAID ibuprofen.  Denies melena or hematemesis.  No previous GI evaluation.  Currently taking pantoprazole 20 Mg twice daily with NO benefit.  Recent H. pylori breath test was negative.  Last labs 04/2023 showed normal CBC and CMP.  No recent labs.  No abdominal surgeries.  She has not had any abdominal imaging.  Past Medical History:  Diagnosis Date   Abnormal Pap smear of cervix    Calculus of kidney    left distal ureteral stone   Dyspareunia     Past Surgical History:  Procedure Laterality Date   CRYOTHERAPY     AGE 20   LITHOTRIPSY     TUBAL LIGATION      Prior to Admission medications   Medication Sig Start Date End Date Taking? Authorizing Provider  busPIRone (BUSPAR) 7.5 MG tablet TAKE 1 TABLET BY MOUTH 3 TIMES DAILY. 05/05/23   Reva Bores, MD   doxycycline (VIBRA-TABS) 100 MG tablet Take 1 tablet (100 mg total) by mouth daily. 05/15/23   Jacky Kindle, FNP  hydrOXYzine (ATARAX) 10 MG tablet Take 1 tablet (10 mg total) by mouth 4 (four) times daily as needed. 04/09/23   Jacky Kindle, FNP  pantoprazole (PROTONIX) 20 MG tablet Take 1 tablet (20 mg total) by mouth 2 (two) times daily. 08/27/23   Debera Lat, PA-C  traZODone (DESYREL) 50 MG tablet Take 0.5-1 tablets (25-50 mg total) by mouth at bedtime as needed. for sleep 10/16/22   Reva Bores, MD  zolpidem (AMBIEN CR) 6.25 MG CR tablet Take 1 tablet (6.25 mg total) by mouth at bedtime as needed for sleep. 05/15/23   Jacky Kindle, FNP    Family History  Problem Relation Age of Onset   Breast cancer Maternal Aunt        1/2    Cancer Maternal Aunt        breast   Heart attack Paternal Grandfather    Heart disease Paternal Grandfather        heart atack   Heart attack Paternal Grandmother    Heart disease Paternal Grandmother        heart attack   Hypertension Brother    Alcohol  abuse Brother    Pancreatitis Brother    Alcohol abuse Father      Social History   Tobacco Use   Smoking status: Former    Current packs/day: 0.00    Average packs/day: 0.5 packs/day for 6.0 years (3.0 ttl pk-yrs)    Types: Cigarettes    Quit date: 08/19/2004    Years since quitting: 19.0   Smokeless tobacco: Never   Tobacco comments:    Previous social smoking- maybe 5 cigs/week  Vaping Use   Vaping status: Never Used  Substance Use Topics   Alcohol use: Yes    Comment: OCCASIONALLY   Drug use: No    Allergies as of 09/05/2023 - Review Complete 09/05/2023  Allergen Reaction Noted   Bactrim Other (See Comments) 08/05/2011   Sulfa antibiotics Other (See Comments) 08/05/2011    Review of Systems:    All systems reviewed and negative except where noted in HPI.   Physical Exam:  BP 108/77   Pulse 83   Temp 98.3 F (36.8 C)   Ht 5' 5.5" (1.664 m)   Wt 145 lb (65.8 kg)   BMI  23.76 kg/m  No LMP recorded. (Menstrual status: IUD).  General:   Alert,  Well-developed, well-nourished, pleasant and cooperative in NAD Lungs:  Respirations even and unlabored.  Clear throughout to auscultation.   No wheezes, crackles, or rhonchi. No acute distress. Heart:  Regular rate and rhythm; no murmurs, clicks, rubs, or gallops. Abdomen:  Normal bowel sounds.  No bruits.  Soft, and non-distended without masses, hepatosplenomegaly or hernias noted.  Mild Epigastric Tenderness.  Moderate RUQ Tenderness.  Positive Murphey's Sign in RUQ.  Mild bilateral lower abdominal tenderness.  No guarding or rebound tenderness.    Neurologic:  Alert and oriented x3;  grossly normal neurologically. Psych:  Alert and cooperative. Normal mood and affect.  Imaging Studies: No results found.  Assessment and Plan:   Victoria [name] is a 43 y.o. y/o female has been referred for   RUQ Pain Epigastric Pain Nausea / Vomiting Belching Early Satiety  Differential includes: Gallstones, GERD, gastritis, peptic ulcer.  Recent H. pylori breath test was negative.  Pantoprazole has NOT helped her symptoms.  I am most suspicious for cholelithiasis.  Ordering RUQ ultrasound, labs, and EGD for further evaluation.  Plan: -Labs: CBC, CMP, Lipase -RUQ Ultrasound -Scheduling EGD I discussed risks of EGD with patient to include risk of bleeding, perforation, and risk of sedation.  Patient expressed understanding and agrees to proceed with EGD.  -Avoid NSAIDS -Continue Pantoprazole 20mg  BID. -HIDA Scan if above tests are unrevealing and symptoms persist.  Follow up After EGD with TG.  Celso Amy, PA-C

## 2023-09-05 ENCOUNTER — Encounter: Payer: Self-pay | Admitting: Physician Assistant

## 2023-09-05 ENCOUNTER — Other Ambulatory Visit: Payer: Self-pay | Admitting: Family Medicine

## 2023-09-05 ENCOUNTER — Ambulatory Visit (INDEPENDENT_AMBULATORY_CARE_PROVIDER_SITE_OTHER): Payer: Managed Care, Other (non HMO) | Admitting: Physician Assistant

## 2023-09-05 VITALS — BP 108/77 | HR 83 | Temp 98.3°F | Ht 65.5 in | Wt 145.0 lb

## 2023-09-05 DIAGNOSIS — R142 Eructation: Secondary | ICD-10-CM | POA: Diagnosis not present

## 2023-09-05 DIAGNOSIS — R1011 Right upper quadrant pain: Secondary | ICD-10-CM

## 2023-09-05 DIAGNOSIS — R112 Nausea with vomiting, unspecified: Secondary | ICD-10-CM

## 2023-09-05 DIAGNOSIS — R6881 Early satiety: Secondary | ICD-10-CM

## 2023-09-05 DIAGNOSIS — R1013 Epigastric pain: Secondary | ICD-10-CM | POA: Diagnosis not present

## 2023-09-05 NOTE — Telephone Encounter (Signed)
Requested medication (s) are due for refill today: yes  Requested medication (s) are on the active medication list: yes  Last refill:  04/09/23  Future visit scheduled: no  Notes to clinic:  Unable to refill per protocol, courtesy refill already given, routing for provider approval.      Requested Prescriptions  Pending Prescriptions Disp Refills   hydrOXYzine (ATARAX) 10 MG tablet 120 tablet 0    Sig: Take 1 tablet (10 mg total) by mouth 4 (four) times daily as needed.     Ear, Nose, and Throat:  Antihistamines 2 Passed - 09/05/2023  2:42 PM      Passed - Cr in normal range and within 360 days    Creatinine, Ser  Date Value Ref Range Status  05/15/2023 0.73 0.57 - 1.00 mg/dL Final         Passed - Valid encounter within last 12 months    Recent Outpatient Visits           1 month ago Gastroesophageal reflux disease without esophagitis   [facility] [place], [place], PA-C   3 months ago Annual physical exam   [facility] Merita Norton T, FNP   1 year ago Left knee pain, unspecified chronicity   [facility] [place], [place], PA-C   1 year ago Annual physical exam   [facility] Merita Norton T, FNP   2 years ago Encounter for preventive health examination   [facility] Jacky Kindle, FNP       Future Appointments             In 1 month Loleta Chance, MD [facility] at [facility], [facility]

## 2023-09-05 NOTE — Telephone Encounter (Signed)
Medication Refill -  Most Recent Primary Care Visit:  Provider: Debera Lat  Department: [facility]  Visit Type: MYCHART VIDEO VISIT  Date: 07/23/2023  Medication:   hydrOXYzine (ATARAX) 10 MG tablet   Has the patient contacted their pharmacy? Yes (Agent: If no, request that the patient contact the pharmacy for the refill. If patient does not wish to contact the pharmacy document the reason why and proceed with request.) (Agent: If yes, when and what did the pharmacy advise?)  Is this the correct pharmacy for this prescription? Yes  This is the patient's preferred pharmacy:  [phone] [place] - [address] [place] [address] Phone: [phone] Fax: [phone]    Has the prescription been filled recently? No  Is the patient out of the medication? No only has 2 pills left   Has the patient been seen for an appointment in the last year OR does the patient have an upcoming appointment? Yes  Can we respond through MyChart? No  Agent: Please be advised that Rx refills may take up to 3 business days. We ask that you follow-up with your pharmacy.

## 2023-09-05 NOTE — Patient Instructions (Signed)
Ultrasound scheduled 09/09/23 @ 7:45 am arrival @ [facility] entrance, Nothing to eat/drink after midnight.

## 2023-09-06 LAB — COMPREHENSIVE METABOLIC PANEL
ALT: 13 [IU]/L (ref 0–32)
AST: 14 [IU]/L (ref 0–40)
Albumin: 4.2 g/dL (ref 3.9–4.9)
Alkaline Phosphatase: 75 [IU]/L (ref 44–121)
BUN/Creatinine Ratio: 14 (ref 9–23)
BUN: 10 mg/dL (ref 6–24)
Bilirubin Total: 0.4 mg/dL (ref 0.0–1.2)
CO2: 22 mmol/L (ref 20–29)
Calcium: 9.3 mg/dL (ref 8.7–10.2)
Chloride: 100 mmol/L (ref 96–106)
Creatinine, Ser: 0.74 mg/dL (ref 0.57–1.00)
Globulin, Total: 2.5 g/dL (ref 1.5–4.5)
Glucose: 115 mg/dL — ABNORMAL HIGH (ref 70–99)
Potassium: 4.2 mmol/L (ref 3.5–5.2)
Sodium: 138 mmol/L (ref 134–144)
Total Protein: 6.7 g/dL (ref 6.0–8.5)
eGFR: 103 mL/min/{1.73_m2} (ref >59)

## 2023-09-06 LAB — CBC WITH DIFFERENTIAL/PLATELET
Basophils Absolute: 0 10*3/uL (ref 0.0–0.2)
Basos: 0 %
EOS (ABSOLUTE): 0 10*3/uL (ref 0.0–0.4)
Eos: 0 %
Hematocrit: 43.1 % (ref 34.0–46.6)
Hemoglobin: 14.3 g/dL (ref 11.1–15.9)
Immature Grans (Abs): 0 10*3/uL (ref 0.0–0.1)
Immature Granulocytes: 0 %
Lymphocytes Absolute: 1.7 10*3/uL (ref 0.7–3.1)
Lymphs: 16 %
MCH: 31.4 pg (ref 26.6–33.0)
MCHC: 33.2 g/dL (ref 31.5–35.7)
MCV: 95 fL (ref 79–97)
Monocytes Absolute: 0.6 10*3/uL (ref 0.1–0.9)
Monocytes: 6 %
Neutrophils Absolute: 7.9 10*3/uL — ABNORMAL HIGH (ref 1.4–7.0)
Neutrophils: 78 %
Platelets: 317 10*3/uL (ref 150–450)
RBC: 4.55 x10E6/uL (ref 3.77–5.28)
RDW: 12 % (ref 11.7–15.4)
WBC: 10.2 10*3/uL (ref 3.4–10.8)

## 2023-09-06 LAB — LIPASE: Lipase: 31 U/L (ref 14–72)

## 2023-09-08 ENCOUNTER — Other Ambulatory Visit: Payer: Self-pay | Admitting: Family Medicine

## 2023-09-08 ENCOUNTER — Encounter: Payer: Self-pay | Admitting: Physician Assistant

## 2023-09-08 NOTE — Telephone Encounter (Signed)
Requested medication (s) are due for refill today: yes  Requested medication (s) are on the active medication list: yes  Last refill:  04/09/23  Future visit scheduled: no  Notes to clinic:  Unable to refill per protocol, courtesy refill already given, routing for provider approval.      Requested Prescriptions  Pending Prescriptions Disp Refills   hydrOXYzine (ATARAX) 10 MG tablet 120 tablet 0    Sig: Take 1 tablet (10 mg total) by mouth 4 (four) times daily as needed.     Ear, Nose, and Throat:  Antihistamines 2 Passed - 09/08/2023  4:29 PM      Passed - Cr in normal range and within 360 days    Creatinine, Ser  Date Value Ref Range Status  09/05/2023 0.74 0.57 - 1.00 mg/dL Final         Passed - Valid encounter within last 12 months    Recent Outpatient Visits           1 month ago Gastroesophageal reflux disease without esophagitis   [facility] [place], [place], PA-C   3 months ago Annual physical exam   [facility] Merita Norton T, FNP   1 year ago Left knee pain, unspecified chronicity   [facility] [place], [place], PA-C   1 year ago Annual physical exam   [facility] Merita Norton T, FNP   2 years ago Encounter for preventive health examination   [facility] Jacky Kindle, FNP       Future Appointments             In 1 month Loleta Chance, MD [facility] at [facility], [facility]

## 2023-09-08 NOTE — Telephone Encounter (Signed)
Already requested this 09/05/2023   Medication Refill -  Most Recent Primary Care Visit:  Provider: Debera Lat  Department: [facility]  Visit Type: MYCHART VIDEO VISIT  Date: 07/23/2023  Medication: hydrOXYzine (ATARAX) 10 MG tablet   Has the patient contacted their pharmacy? Yes (Agent: If no, request that the patient contact the pharmacy for the refill. If patient does not wish to contact the pharmacy document the reason why and proceed with request.) (Agent: If yes, when and what did the pharmacy advise?)  Is this the correct pharmacy for this prescription? Yes If no, delete pharmacy and type the correct one.  This is the patient's preferred pharmacy:   [phone] [place] - [address] [place] [address] Phone: [phone] Fax: [phone]  Has the prescription been filled recently? No.  Is the patient out of the medication? Yes Since Friday   Has the patient been seen for an appointment in the last year OR does the patient have an upcoming appointment? Yes  Can we respond through MyChart? Yes  Agent: Please be advised that Rx refills may take up to 3 business days. We ask that you follow-up with your pharmacy.

## 2023-09-09 ENCOUNTER — Telehealth: Payer: Self-pay

## 2023-09-09 ENCOUNTER — Ambulatory Visit: Payer: Self-pay

## 2023-09-09 ENCOUNTER — Ambulatory Visit
Admission: RE | Admit: 2023-09-09 | Discharge: 2023-09-09 | Disposition: A | Discharge status: Home / Self Care | Payer: Managed Care, Other (non HMO) | Source: Ambulatory Visit | Attending: Physician Assistant | Admitting: Physician Assistant

## 2023-09-09 DIAGNOSIS — R1011 Right upper quadrant pain: Secondary | ICD-10-CM | POA: Diagnosis present

## 2023-09-09 DIAGNOSIS — K8020 Calculus of gallbladder without cholecystitis without obstruction: Secondary | ICD-10-CM

## 2023-09-09 NOTE — Telephone Encounter (Signed)
     Chief Complaint: Pt. Asking for refill on hydroxyzine, had Physical 05/15/23. States she was told she needs on OV "and I don't understand since I was just seen."  Symptoms: n/a Frequency: n/a Pertinent Negatives: Patient denies  Disposition: [] ED /[] Urgent Care (no appt availability in office) / [] Appointment(In office/virtual)/ []  [facility]/ [] Home Care/ [] Refused Recommended Disposition /[]  Mobile Bus/ [x]  Follow-up with PCP Additional Notes: Please advise pt. On refill.  Reason for Disposition  [1] Caller has NON-URGENT medicine question about med that PCP prescribed AND [2] triager unable to answer question  Answer Assessment - Initial Assessment Questions 1. DRUG NAME: "What medicine do you need to have refilled?"     Hydroxyzine  2. REFILLS REMAINING: "How many refills are remaining?" (Note: The label on the medicine or pill bottle will show how many refills are remaining. If there are no refills remaining, then a renewal may be needed.)     0 3. EXPIRATION DATE: "What is the expiration date?" (Note: The label states when the prescription will expire, and thus can no longer be refilled.)     N/a 4. PRESCRIBING HCP: "Who prescribed it?" Reason: If prescribed by specialist, call should be referred to that group.     [place] 5. SYMPTOMS: "Do you have any symptoms?"     N/a 6. PREGNANCY: "Is there any chance that you are pregnant?" "When was your last menstrual period?"     No  Protocols used: Medication Refill and Renewal Call-A-AH

## 2023-09-09 NOTE — Telephone Encounter (Signed)
Referral to [facility] for gallstones. Patient notified.  Call and notify pt. RUQ Ultrasound shows Gallstones.  Normal bile duct.  No liver lesions.  I recommend refer to general surgeon to discuss gallbladder surgery if she is still having episodes of RUQ pain.  Also continue with plan for EGD.  Celso Amy, PA-C

## 2023-09-09 NOTE — Progress Notes (Signed)
Call and notify pt. RUQ Ultrasound shows Gallstones.  Normal bile duct.  No liver lesions.  I recommend refer to general surgeon to discuss gallbladder surgery if she is still having episodes of RUQ pain.  Also continue with plan for EGD. Celso Amy, PA-C

## 2023-09-10 ENCOUNTER — Telehealth: Payer: Self-pay

## 2023-09-10 MED ORDER — HYDROXYZINE HCL 10 MG PO TABS: 10.0000 mg | ORAL_TABLET | Freq: Four times a day (QID) | ORAL | 0 refills | Status: DC | PRN

## 2023-09-10 NOTE — Telephone Encounter (Unsigned)
Copied from CRM [id number]. Topic: General - Inquiry >> Sep 09, 2023  2:14 PM Teressa P wrote: Reason for CCRN:  Pt called saying she needs the refill on the Hydroxyzine asap.  She has taken her last one.  She does not understand why she would need an appt/  she said she had a cpe in September.  Please call patient from the office if a refill can cont be sent in.

## 2023-09-10 NOTE — Telephone Encounter (Signed)
Copied from CRM [id number]. Topic: General - Other >> Sep 09, 2023  9:08 AM Franchot Heidelberg wrote: Reason for CRM: Pt called and wants to know if she needs to schedule an appt in order to get her med refill? She requested hydroxyzine on Friday.   Please advise

## 2023-09-10 NOTE — Telephone Encounter (Signed)
A courtesy refill was sent to patient's pharmacy 09/10/2023 by Dr. Payton Mccallum

## 2023-09-10 NOTE — Telephone Encounter (Signed)
Copied from CRM [id number]. Topic: General - Other >> Sep 09, 2023  3:48 PM Everette C wrote: Reason for CRM: The patient has called to follow up on their previous refill request of hydrOXYzine (ATARAX) 10 MG tablet [[id number]]  The patient would like to speak with a member of staff when possible to follow up  Please contact when possible

## 2023-09-10 NOTE — Telephone Encounter (Signed)
Courtesy rx sent. Pt was advised to follow-up in 4 weeks when seen on 05/15/23.

## 2023-09-11 ENCOUNTER — Telehealth: Payer: Self-pay | Admitting: General Surgery

## 2023-09-11 ENCOUNTER — Ambulatory Visit: Payer: Self-pay | Admitting: General Surgery

## 2023-09-11 ENCOUNTER — Ambulatory Visit (INDEPENDENT_AMBULATORY_CARE_PROVIDER_SITE_OTHER): Payer: Managed Care, Other (non HMO) | Admitting: General Surgery

## 2023-09-11 ENCOUNTER — Telehealth: Payer: Self-pay

## 2023-09-11 ENCOUNTER — Encounter: Payer: Self-pay | Admitting: General Surgery

## 2023-09-11 VITALS — BP 136/77 | HR 88 | Temp 98.9°F | Ht 66.5 in | Wt 142.8 lb

## 2023-09-11 DIAGNOSIS — K8050 Calculus of bile duct without cholangitis or cholecystitis without obstruction: Secondary | ICD-10-CM

## 2023-09-11 DIAGNOSIS — K8020 Calculus of gallbladder without cholecystitis without obstruction: Secondary | ICD-10-CM

## 2023-09-11 NOTE — Telephone Encounter (Signed)
Patient has been advised of Pre-Admission date/time, and Surgery date at [facility].  Surgery Date: 09/19/23 Preadmission Testing Date: 09/15/23 (phone 8a-1p)  Patient has been made aware to call [phone], between 1-3:00pm the day before surgery, to find out what time to arrive for surgery.

## 2023-09-11 NOTE — Telephone Encounter (Signed)
Referral [id number]  No prior auth required per fax [id number] 09-05-2023 until 03-07-2024

## 2023-09-11 NOTE — Patient Instructions (Signed)
 You have requested to have your gallbladder removed. This will be done at [name].  You will most likely be out of work 1-2 weeks for this surgery.  If you have FMLA or disability paperwork that needs filled out you may drop this off at our office or this can be faxed to [phone].  You will return after your post-op appointment with a lifting restriction for approximately 4 more weeks.  You will be able to eat anything you would like to following surgery. But, start by eating a bland diet and advance this as tolerated. The Gallbladder diet is below, please go as closely by this diet as possible prior to surgery to avoid any further attacks.  Please see the (blue)pre-care form that you have been given today. Our surgery scheduler will call you to verify surgery date and to go over information.   If you have any questions, please call our office.  Laparoscopic Cholecystectomy Laparoscopic cholecystectomy is surgery to remove the gallbladder. The gallbladder is located in the upper right part of the abdomen, behind the liver. It is a storage sac for bile, which is produced in the liver. Bile aids in the digestion and absorption of fats. Cholecystectomy is often done for inflammation of the gallbladder (cholecystitis). This condition is usually caused by a buildup of gallstones (cholelithiasis) in the gallbladder. Gallstones can block the flow of bile, and that can result in inflammation and pain. In severe cases, emergency surgery may be required. If emergency surgery is not required, you will have time to prepare for the procedure. Laparoscopic surgery is an alternative to open surgery. Laparoscopic surgery has a shorter recovery time. Your common bile duct may also need to be examined during the procedure. If stones are found in the common bile duct, they may be removed. LET [facility] CARE PROVIDER KNOW ABOUT: Any allergies you have. All medicines you are taking,  including vitamins, herbs, eye drops, creams, and over-the-counter medicines. Previous problems you or members of your family have had with the use of anesthetics. Any blood disorders you have. Previous surgeries you have had.  Any medical conditions you have. RISKS AND COMPLICATIONS Generally, this is a safe procedure. However, problems may occur, including: Infection. Bleeding. Allergic reactions to medicines. Damage to other structures or organs. A stone remaining in the common bile duct. A bile leak from the cyst duct that is clipped when your gallbladder is removed. The need to convert to open surgery, which requires a larger incision in the abdomen. This may be necessary if your surgeon thinks that it is not safe to continue with a laparoscopic procedure. BEFORE THE PROCEDURE Ask your health care provider about: Changing or stopping your regular medicines. This is especially important if you are taking diabetes medicines or blood thinners. Taking medicines such as aspirin and ibuprofen. These medicines can thin your blood. Do not take these medicines before your procedure if your health care provider instructs you not to. Follow instructions from your health care provider about eating or drinking restrictions. Let your health care provider know if you develop a cold or an infection before surgery. Plan to have someone take you home after the procedure. Ask your health care provider how your surgical site will be marked or identified. You may be given antibiotic medicine to help prevent infection. PROCEDURE To reduce your risk of infection: Your health care team will wash or sanitize their hands. Your skin will be washed with soap. An IV  tube may be inserted into one of your veins. You will be given a medicine to make you fall asleep (general anesthetic). A breathing tube will be placed in your mouth. The surgeon will make several small cuts (incisions) in your abdomen. A thin,  lighted tube (laparoscope) that has a tiny camera on the end will be inserted through one of the small incisions. The camera on the laparoscope will send a picture to a TV screen (monitor) in the operating room. This will give the surgeon a good view inside your abdomen. A gas will be pumped into your abdomen. This will expand your abdomen to give the surgeon more room to perform the surgery. Other tools that are needed for the procedure will be inserted through the other incisions. The gallbladder will be removed through one of the incisions. After your gallbladder has been removed, the incisions will be closed with stitches (sutures), staples, or skin glue. Your incisions may be covered with a bandage (dressing). The procedure may vary among health care providers and hospitals. AFTER THE PROCEDURE Your blood pressure, heart rate, breathing rate, and blood oxygen level will be monitored often until the medicines you were given have worn off. You will be given medicines as needed to control your pain.   This information is not intended to replace advice given to you by your health care provider. Make sure you discuss any questions you have with your health care provider.   Document Released: 08/05/2005 Document Revised: 04/26/2015 Document Reviewed: 03/17/2013 Elsevier Interactive Patient Education 2016 Elsevier Inc.   Low-Fat Diet for Gallbladder Conditions A low-fat diet can be helpful if you have pancreatitis or a gallbladder condition. With these conditions, your pancreas and gallbladder have trouble digesting fats. A healthy eating plan with less fat will help rest your pancreas and gallbladder and reduce your symptoms. WHAT DO I NEED TO KNOW ABOUT THIS DIET? Eat a low-fat diet. Reduce your fat intake to less than 20-30% of your total daily calories. This is less than 50-60 g of fat per day. Remember that you need some fat in your diet. Ask your dietician what your daily goal should  be. Choose nonfat and low-fat healthy foods. Look for the words "nonfat," "low fat," or "fat free." As a guide, look on the label and choose foods with less than 3 g of fat per serving. Eat only one serving. Avoid alcohol. Do not smoke. If you need help quitting, talk with your health care provider. Eat small frequent meals instead of three large heavy meals. WHAT FOODS CAN I EAT? Grains Include healthy grains and starches such as potatoes, wheat bread, fiber-rich cereal, and brown rice. Choose whole grain options whenever possible. In adults, whole grains should account for 45-65% of your daily calories.  Fruits and Vegetables Eat plenty of fruits and vegetables. Fresh fruits and vegetables add fiber to your diet. Meats and Other Protein Sources Eat lean meat such as chicken and pork. Trim any fat off of meat before cooking it. Eggs, fish, and beans are other sources of protein. In adults, these foods should account for 10-35% of your daily calories. Dairy Choose low-fat milk and dairy options. Dairy includes fat and protein, as well as calcium.  Fats and Oils Limit high-fat foods such as fried foods, sweets, baked goods, sugary drinks.  Other Creamy sauces and condiments, such as mayonnaise, can add extra fat. Think about whether or not you need to use them, or use smaller amounts or low fat options.  WHAT FOODS ARE NOT RECOMMENDED? High fat foods, such as: [employer]. Ice cream. [place] toast. Sweet rolls. Pizza. Cheese bread. Foods covered with batter, butter, creamy sauces, or cheese. Fried foods. Sugary drinks and desserts. Foods that cause gas or bloating   This information is not intended to replace advice given to you by your health care provider. Make sure you discuss any questions you have with your health care provider.   Document Released: 08/10/2013 Document Reviewed: 08/10/2013 Elsevier Interactive Patient Education [employer]

## 2023-09-11 NOTE — Progress Notes (Signed)
Patient ID: Victoria [name], female   DOB: 1981/01/15, 43 y.o.   MRN: [id number] CC: Biliary Colic History of Present Illness Victoria [name] is a 43 y.o. female with past medical history of anxiety who presents in consultation for biliary colic.  The patient reports that about a year ago she started to develop right upper quadrant pain after eating.  She said initially that this was worse after spicy foods and she tried to cut this out.  However, she now has pain after eating more times and not.  She describes the pain as in the right upper quadrant with radiation to her shoulders.  She also notes that she has had diarrhea with this.  She sometimes will have episodes of nausea and vomiting.  She says that the pain will go away after several hours.  She denies any fevers or chills with these episodes.  She was started on a PPI but this did not help with the pain.  She has been referred to GI and has a EGD scheduled she also had an ultrasound that showed cholelithiasis.  Past Medical History Past Medical History:  Diagnosis Date   Abnormal Pap smear of cervix    Calculus of kidney    left distal ureteral stone   Dyspareunia        Past Surgical History:  Procedure Laterality Date   CRYOTHERAPY     AGE 70   LITHOTRIPSY     TUBAL LIGATION      Allergies  Allergen Reactions   Bactrim Other (See Comments)    Dizziness    Sulfa Antibiotics Other (See Comments)    dizziness    Current Outpatient Medications  Medication Sig Dispense Refill   busPIRone (BUSPAR) 7.5 MG tablet TAKE 1 TABLET BY MOUTH 3 TIMES DAILY. 270 tablet 2   hydrOXYzine (ATARAX) 10 MG tablet Take 1 tablet (10 mg total) by mouth 4 (four) times daily as needed. 120 tablet 0   pantoprazole (PROTONIX) 20 MG tablet Take 1 tablet (20 mg total) by mouth 2 (two) times daily. 180 tablet 1   zolpidem (AMBIEN CR) 6.25 MG CR tablet Take 1 tablet (6.25 mg total) by mouth at bedtime as needed for sleep. 30 tablet 0   No current  facility-administered medications for this visit.    Family History Family History  Problem Relation Age of Onset   Breast cancer Maternal Aunt        1/2    Cancer Maternal Aunt        breast   Heart attack Paternal Grandfather    Heart disease Paternal Grandfather        heart atack   Heart attack Paternal Grandmother    Heart disease Paternal Grandmother        heart attack   Hypertension Brother    Alcohol abuse Brother    Pancreatitis Brother    Alcohol abuse Father        Social History Social History   Tobacco Use   Smoking status: Former    Current packs/day: 0.00    Average packs/day: 0.5 packs/day for 6.0 years (3.0 ttl pk-yrs)    Types: Cigarettes    Quit date: 08/19/2004    Years since quitting: 19.0   Smokeless tobacco: Never   Tobacco comments:    Previous social smoking- maybe 5 cigs/week  Vaping Use   Vaping status: Never Used  Substance Use Topics   Alcohol use: Yes    Comment: OCCASIONALLY  Drug use: No        ROS Full ROS of systems performed and is otherwise negative there than what is stated in the HPI  Physical Exam Blood pressure 136/77, pulse 88, temperature 98.9 F (37.2 C), temperature source Oral, height 5' 6.5" (1.689 m), weight 142 lb 12.8 oz (64.8 kg), SpO2 97%.  No acute distress, PERRLA, alert and oriented x 3, good auscultation bilaterally, regular rate and rhythm abdomen is soft, nontender and nondistended.  No Murphy sign moving all extremities spontaneously  Data Reviewed Ultrasound reviewed and there does seem to be stones within the gallbladder lumen without pericholecystic fluid or gallbladder wall thickening.  CMP done last week and notable for normal bilirubin as well as normal liver function tests  I have personally reviewed the patient's imaging and medical records.    Assessment    43 year old with symptomatic cholelithiasis  Plan    I discussed the recommendations for robotic assisted cholecystectomy.  I  discussed risk, benefits alternatives of the procedure including risk of infection, bleeding, need for open, bile leak, retained stone and injury to the common bile duct.  She understands these risks and wishes to proceed.  We will use ICG.    Kandis Cocking 09/11/2023, 11:47 AM

## 2023-09-15 ENCOUNTER — Encounter
Admission: RE | Admit: 2023-09-15 | Discharge: 2023-09-15 | Disposition: A | Discharge status: Home / Self Care | Payer: Managed Care, Other (non HMO) | Source: Ambulatory Visit | Attending: General Surgery | Admitting: General Surgery

## 2023-09-15 DIAGNOSIS — Z01818 Encounter for other preprocedural examination: Secondary | ICD-10-CM

## 2023-09-15 HISTORY — DX: Calculus of bile duct without cholangitis or cholecystitis without obstruction: K80.50

## 2023-09-15 HISTORY — DX: Gastro-esophageal reflux disease without esophagitis: K21.9

## 2023-09-15 NOTE — Patient Instructions (Signed)
Your procedure is scheduled on:09-19-23 Friday Report to the Registration Desk on the 1st floor of the [facility].Then proceed to the 2nd floor Surgery Desk To find out your arrival time, please call [phone] between 1PM - 3PM on:09-18-23 Thursday If your arrival time is 6:00 am, do not arrive before that time as the [facility] entrance doors do not open until 6:00 am.  REMEMBER: Instructions that are not followed completely may result in serious medical risk, up to and including death; or upon the discretion of your surgeon and anesthesiologist your surgery may need to be rescheduled.  Do not eat food OR drink any liquids after midnight the night before surgery.  No gum chewing or hard candies.  One week prior to surgery:Stop NOW (09-15-23) Stop Anti-inflammatories (NSAIDS) such as Advil, Aleve, Ibuprofen, Motrin, Naproxen, Naprosyn and Aspirin based products such as Excedrin, Goody's Powder, BC Powder. Stop ANY OVER THE COUNTER supplements until after surgery.  You may however, continue to take Tylenol if needed for pain up until the day of surgery.  Continue taking all of your other prescription medications up until the day of surgery.  ON THE DAY OF SURGERY ONLY TAKE THESE MEDICATIONS WITH SIPS OF WATER: -pantoprazole (PROTONIX)  -busPIRone (BUSPAR)  -You may take hydrOXYzine (ATARAX) for anxiety if needed  No Alcohol for 24 hours before or after surgery.  No Smoking including e-cigarettes for 24 hours before surgery.  No chewable tobacco products for at least 6 hours before surgery.  No nicotine patches on the day of surgery.  Do not use any "recreational" drugs for at least a week (preferably 2 weeks) before your surgery.  Please be advised that the combination of cocaine and anesthesia may have negative outcomes, up to and including death. If you test positive for cocaine, your surgery will be cancelled.  On the morning of surgery brush your teeth with toothpaste and  water, you may rinse your mouth with mouthwash if you wish. Do not swallow any toothpaste or mouthwash.  Use CHG Soap as directed on instruction sheet.  Do not wear jewelry, make-up, hairpins, clips or nail polish.  For welded (permanent) jewelry: bracelets, anklets, waist bands, etc.  Please have this removed prior to surgery.  If it is not removed, there is a chance that hospital personnel will need to cut it off on the day of surgery.  Do not wear lotions, powders, or perfumes.   Do not shave body hair from the neck down 48 hours before surgery.  Contact lenses, hearing aids and dentures may not be worn into surgery.  Do not bring valuables to the hospital. [facility] is not responsible for any missing/lost belongings or valuables.   Notify your doctor if there is any change in your medical condition (cold, fever, infection).  Wear comfortable clothing (specific to your surgery type) to the hospital.  After surgery, you can help prevent lung complications by doing breathing exercises.  Take deep breaths and cough every 1-2 hours. Your doctor may order a device called an Incentive Spirometer to help you take deep breaths. When coughing or sneezing, hold a pillow firmly against your incision with both hands. This is called "splinting." Doing this helps protect your incision. It also decreases belly discomfort.  If you are being admitted to the hospital overnight, leave your suitcase in the car. After surgery it may be brought to your room.  In case of increased patient census, it may be necessary for you, the patient, to  continue your postoperative care in the Same Day Surgery department.  If you are being discharged the day of surgery, you will not be allowed to drive home. You will need a responsible individual to drive you home and stay with you for 24 hours after surgery.   If you are taking public transportation, you will need to have a responsible individual with  you.  Please call the Pre-admissions Testing Dept. at [phone] if you have any questions about these instructions.  Surgery Visitation Policy:  Patients having surgery or a procedure may have two visitors.  Children under the age of 15 must have an adult with them who is not the patient.  Temporary Visitor Restrictions Due to increasing cases of flu, RSV and COVID-19: Children ages 81 and under will not be able to visit patients in [facility] hospitals under most circumstances.     Preparing for Surgery with CHLORHEXIDINE GLUCONATE (CHG) Soap  Chlorhexidine Gluconate (CHG) Soap  o An antiseptic cleaner that kills germs and bonds with the skin to continue killing germs even after washing  o Used for showering the night before surgery and morning of surgery  Before surgery, you can play an important role by reducing the number of germs on your skin.  CHG (Chlorhexidine gluconate) soap is an antiseptic cleanser which kills germs and bonds with the skin to continue killing germs even after washing.  Please do not use if you have an allergy to CHG or antibacterial soaps. If your skin becomes reddened/irritated stop using the CHG.  1. Shower the NIGHT BEFORE SURGERY and the MORNING OF SURGERY with CHG soap.  2. If you choose to wash your hair, wash your hair first as usual with your normal shampoo.  3. After shampooing, rinse your hair and body thoroughly to remove the shampoo.  4. Use CHG as you would any other liquid soap. You can apply CHG directly to the skin and wash gently with a scrungie or a clean washcloth.  5. Apply the CHG soap to your body only from the neck down. Do not use on open wounds or open sores. Avoid contact with your eyes, ears, mouth, and genitals (private parts). Wash face and genitals (private parts) with your normal soap.  6. Wash thoroughly, paying special attention to the area where your surgery will be performed.  7. Thoroughly rinse your body  with warm water.  8. Do not shower/wash with your normal soap after using and rinsing off the CHG soap.  9. Pat yourself dry with a clean towel.  10. Wear clean pajamas to bed the night before surgery.  12. Place clean sheets on your bed the night of your first shower and do not sleep with pets.  13. Shower again with the CHG soap on the day of surgery prior to arriving at the hospital.  14. Do not apply any deodorants/lotions/powders.  15. Please wear clean clothes to the hospital.

## 2023-09-18 MED ORDER — CHLORHEXIDINE GLUCONATE 0.12 % MT SOLN
15.0000 mL | Freq: Once | OROMUCOSAL | Status: AC
  Administered 2023-09-19: 15 mL via OROMUCOSAL

## 2023-09-18 MED ORDER — INDOCYANINE GREEN 25 MG IV SOLR
1.2500 mg | Freq: Once | INTRAVENOUS | Status: AC
  Administered 2023-09-19: 1.25 mg via INTRAVENOUS

## 2023-09-18 MED ORDER — ORAL CARE MOUTH RINSE: 15.0000 mL | Freq: Once | OROMUCOSAL | Status: AC

## 2023-09-18 MED ORDER — CHLORHEXIDINE GLUCONATE CLOTH 2 % EX PADS
6.0000 | MEDICATED_PAD | Freq: Once | CUTANEOUS | Status: AC
  Administered 2023-09-19: 6 via TOPICAL

## 2023-09-18 MED ORDER — SODIUM CHLORIDE 0.9 % IV SOLN
2.0000 g | INTRAVENOUS | Status: AC
  Administered 2023-09-19: 2 g via INTRAVENOUS

## 2023-09-18 MED ORDER — CHLORHEXIDINE GLUCONATE CLOTH 2 % EX PADS
6.0000 | MEDICATED_PAD | Freq: Once | CUTANEOUS | Status: AC
  Administered 2023-09-18: 6 via TOPICAL

## 2023-09-18 MED ORDER — LACTATED RINGERS IV SOLN: INTRAVENOUS | Status: DC

## 2023-09-19 ENCOUNTER — Other Ambulatory Visit: Payer: Self-pay

## 2023-09-19 ENCOUNTER — Ambulatory Visit
Admission: RE | Admit: 2023-09-19 | Discharge: 2023-09-19 | Disposition: A | Discharge status: Home / Self Care | Payer: Managed Care, Other (non HMO) | Attending: General Surgery | Admitting: General Surgery

## 2023-09-19 ENCOUNTER — Ambulatory Visit: Payer: Managed Care, Other (non HMO) | Admitting: Urgent Care

## 2023-09-19 ENCOUNTER — Encounter
Admission: RE | Disposition: A | Discharge status: Home / Self Care | Payer: Self-pay | Source: Home / Self Care | Attending: General Surgery

## 2023-09-19 ENCOUNTER — Encounter: Payer: Self-pay | Admitting: General Surgery

## 2023-09-19 ENCOUNTER — Ambulatory Visit: Payer: Managed Care, Other (non HMO) | Admitting: General Practice

## 2023-09-19 DIAGNOSIS — K219 Gastro-esophageal reflux disease without esophagitis: Secondary | ICD-10-CM | POA: Insufficient documentation

## 2023-09-19 DIAGNOSIS — K8020 Calculus of gallbladder without cholecystitis without obstruction: Secondary | ICD-10-CM | POA: Diagnosis not present

## 2023-09-19 DIAGNOSIS — K8050 Calculus of bile duct without cholangitis or cholecystitis without obstruction: Secondary | ICD-10-CM

## 2023-09-19 DIAGNOSIS — K8010 Calculus of gallbladder with chronic cholecystitis without obstruction: Secondary | ICD-10-CM | POA: Insufficient documentation

## 2023-09-19 DIAGNOSIS — Z87891 Personal history of nicotine dependence: Secondary | ICD-10-CM | POA: Insufficient documentation

## 2023-09-19 DIAGNOSIS — Z79899 Other long term (current) drug therapy: Secondary | ICD-10-CM | POA: Diagnosis not present

## 2023-09-19 DIAGNOSIS — K828 Other specified diseases of gallbladder: Secondary | ICD-10-CM | POA: Insufficient documentation

## 2023-09-19 DIAGNOSIS — Z01818 Encounter for other preprocedural examination: Secondary | ICD-10-CM

## 2023-09-19 LAB — POCT PREGNANCY, URINE: Preg Test, Ur: NEGATIVE

## 2023-09-19 SURGERY — CHOLECYSTECTOMY, ROBOT-ASSISTED, LAPAROSCOPIC
Anesthesia: General

## 2023-09-19 MED ORDER — DEXAMETHASONE SODIUM PHOSPHATE 10 MG/ML IJ SOLN
INTRAMUSCULAR | Status: DC | PRN
  Administered 2023-09-19: 5 mg via INTRAVENOUS

## 2023-09-19 MED ORDER — FENTANYL CITRATE (PF) 100 MCG/2ML IJ SOLN
INTRAMUSCULAR | Status: AC
  Filled 2023-09-19: qty 2

## 2023-09-19 MED ORDER — OXYCODONE HCL 5 MG PO TABS: 5.0000 mg | ORAL_TABLET | Freq: Four times a day (QID) | ORAL | 0 refills | Status: DC | PRN

## 2023-09-19 MED ORDER — LACTATED RINGERS IV SOLN: INTRAVENOUS | Status: DC | PRN

## 2023-09-19 MED ORDER — FENTANYL CITRATE (PF) 100 MCG/2ML IJ SOLN
INTRAMUSCULAR | Status: DC | PRN
  Administered 2023-09-19 (×4): 50 ug via INTRAVENOUS

## 2023-09-19 MED ORDER — ROCURONIUM BROMIDE 100 MG/10ML IV SOLN
INTRAVENOUS | Status: DC | PRN
  Administered 2023-09-19: 50 mg via INTRAVENOUS

## 2023-09-19 MED ORDER — SUGAMMADEX SODIUM 200 MG/2ML IV SOLN
INTRAVENOUS | Status: DC | PRN
  Administered 2023-09-19: 200 mg via INTRAVENOUS

## 2023-09-19 MED ORDER — 0.9 % SODIUM CHLORIDE (POUR BTL) OPTIME
TOPICAL | Status: DC | PRN
  Administered 2023-09-19: 500 mL

## 2023-09-19 MED ORDER — DEXAMETHASONE SODIUM PHOSPHATE 10 MG/ML IJ SOLN
INTRAMUSCULAR | Status: AC
  Filled 2023-09-19: qty 1

## 2023-09-19 MED ORDER — BUPIVACAINE-EPINEPHRINE (PF) 0.5% -1:200000 IJ SOLN
INTRAMUSCULAR | Status: DC | PRN
  Administered 2023-09-19: 10 mL

## 2023-09-19 MED ORDER — BUPIVACAINE LIPOSOME 1.3 % IJ SUSP
INTRAMUSCULAR | Status: AC
  Filled 2023-09-19: qty 10

## 2023-09-19 MED ORDER — LIDOCAINE HCL (CARDIAC) PF 100 MG/5ML IV SOSY
PREFILLED_SYRINGE | INTRAVENOUS | Status: DC | PRN
  Administered 2023-09-19: 60 mg via INTRAVENOUS

## 2023-09-19 MED ORDER — KETOROLAC TROMETHAMINE 30 MG/ML IJ SOLN
INTRAMUSCULAR | Status: DC | PRN
  Administered 2023-09-19: 30 mg via INTRAVENOUS

## 2023-09-19 MED ORDER — FENTANYL CITRATE (PF) 100 MCG/2ML IJ SOLN
25.0000 ug | INTRAMUSCULAR | Status: DC | PRN
  Administered 2023-09-19 (×2): 25 ug via INTRAVENOUS

## 2023-09-19 MED ORDER — OXYCODONE HCL 5 MG/5ML PO SOLN: 5.0000 mg | Freq: Once | ORAL | Status: DC | PRN

## 2023-09-19 MED ORDER — INDOCYANINE GREEN 25 MG IV SOLR
INTRAVENOUS | Status: AC
  Filled 2023-09-19: qty 10

## 2023-09-19 MED ORDER — CHLORHEXIDINE GLUCONATE 0.12 % MT SOLN
OROMUCOSAL | Status: AC
  Filled 2023-09-19: qty 15

## 2023-09-19 MED ORDER — BUPIVACAINE LIPOSOME 1.3 % IJ SUSP
INTRAMUSCULAR | Status: DC | PRN
  Administered 2023-09-19: 10 mL

## 2023-09-19 MED ORDER — ONDANSETRON HCL 4 MG/2ML IJ SOLN
INTRAMUSCULAR | Status: AC
  Filled 2023-09-19: qty 2

## 2023-09-19 MED ORDER — DROPERIDOL 2.5 MG/ML IJ SOLN
0.6250 mg | Freq: Once | INTRAMUSCULAR | Status: AC
Start: 2023-09-19 — End: 2023-09-19
  Administered 2023-09-19: 0.625 mg via INTRAVENOUS
  Filled 2023-09-19: qty 2

## 2023-09-19 MED ORDER — ONDANSETRON HCL 4 MG/2ML IJ SOLN
INTRAMUSCULAR | Status: DC | PRN
  Administered 2023-09-19: 4 mg via INTRAVENOUS

## 2023-09-19 MED ORDER — MIDAZOLAM HCL 2 MG/2ML IJ SOLN
INTRAMUSCULAR | Status: DC | PRN
  Administered 2023-09-19: 2 mg via INTRAVENOUS

## 2023-09-19 MED ORDER — OXYCODONE HCL 5 MG PO TABS: 5.0000 mg | ORAL_TABLET | Freq: Once | ORAL | Status: DC | PRN

## 2023-09-19 MED ORDER — PROPOFOL 10 MG/ML IV BOLUS
INTRAVENOUS | Status: DC | PRN
  Administered 2023-09-19: 150 mg via INTRAVENOUS

## 2023-09-19 MED ORDER — SODIUM CHLORIDE 0.9 % IV SOLN
INTRAVENOUS | Status: AC
  Filled 2023-09-19: qty 2

## 2023-09-19 MED ORDER — DROPERIDOL 2.5 MG/ML IJ SOLN
INTRAMUSCULAR | Status: AC
  Filled 2023-09-19: qty 2

## 2023-09-19 MED ORDER — BUPIVACAINE-EPINEPHRINE (PF) 0.5% -1:200000 IJ SOLN
INTRAMUSCULAR | Status: AC
  Filled 2023-09-19: qty 10

## 2023-09-19 MED ORDER — KETOROLAC TROMETHAMINE 30 MG/ML IJ SOLN
INTRAMUSCULAR | Status: AC
  Filled 2023-09-19: qty 1

## 2023-09-19 MED ORDER — MIDAZOLAM HCL 2 MG/2ML IJ SOLN
INTRAMUSCULAR | Status: AC
  Filled 2023-09-19: qty 2

## 2023-09-19 MED ORDER — PROPOFOL 10 MG/ML IV BOLUS
INTRAVENOUS | Status: AC
  Filled 2023-09-19: qty 20

## 2023-09-19 SURGICAL SUPPLY — 43 items
BAG PRESSURE INF REUSE 1000 (BAG) IMPLANT
CANNULA REDUCER 12-8 DVNC XI (CANNULA) ×2 IMPLANT
CAUTERY HOOK MNPLR 1.6 DVNC XI (INSTRUMENTS) ×2 IMPLANT
CLIP LIGATING HEMO O LOK GREEN (MISCELLANEOUS) ×2 IMPLANT
DERMABOND ADVANCED .7 DNX12 (GAUZE/BANDAGES/DRESSINGS) ×2 IMPLANT
DRAPE ARM DVNC X/XI (DISPOSABLE) ×8 IMPLANT
DRAPE COLUMN DVNC XI (DISPOSABLE) ×2 IMPLANT
ELECT REM PT RETURN 9FT ADLT (ELECTROSURGICAL) ×1
ELECTRODE REM PT RTRN 9FT ADLT (ELECTROSURGICAL) ×2 IMPLANT
FORCEPS BPLR 8 MD DVNC XI (FORCEP) IMPLANT
FORCEPS BPLR R/ABLATION 8 DVNC (INSTRUMENTS) ×2 IMPLANT
FORCEPS PROGRASP DVNC XI (FORCEP) ×2 IMPLANT
GLOVE BIOGEL PI IND STRL 7.5 (GLOVE) ×4 IMPLANT
GLOVE SURG SYN 7.0 (GLOVE) ×2
GLOVE SURG SYN 7.0 PF PI (GLOVE) ×4 IMPLANT
GOWN STRL REUS W/ TWL LRG LVL3 (GOWN DISPOSABLE) ×6 IMPLANT
GRASPER SUT TROCAR 14GX15 (MISCELLANEOUS) ×2 IMPLANT
IRRIGATOR SUCT 8 DISP DVNC XI (IRRIGATION / IRRIGATOR) IMPLANT
IV NS 1000ML BAXH (IV SOLUTION) IMPLANT
KIT PINK PAD W/HEAD ARE REST (MISCELLANEOUS) ×1
KIT PINK PAD W/HEAD ARM REST (MISCELLANEOUS) ×2 IMPLANT
LABEL OR SOLS (LABEL) ×2 IMPLANT
MANIFOLD NEPTUNE II (INSTRUMENTS) ×2 IMPLANT
NDL HYPO 22X1.5 SAFETY MO (MISCELLANEOUS) ×2 IMPLANT
NDL INSUFFLATION 14GA 120MM (NEEDLE) ×2 IMPLANT
NEEDLE HYPO 22X1.5 SAFETY MO (MISCELLANEOUS) ×1
NEEDLE INSUFFLATION 14GA 120MM (NEEDLE) ×1
NS IRRIG 500ML POUR BTL (IV SOLUTION) ×2 IMPLANT
OBTURATOR OPTICAL STND 8 DVNC (TROCAR) ×1
OBTURATOR OPTICALSTD 8 DVNC (TROCAR) ×2 IMPLANT
PACK LAP CHOLECYSTECTOMY (MISCELLANEOUS) ×2 IMPLANT
SEAL UNIV 5-12 XI (MISCELLANEOUS) ×8 IMPLANT
SET TUBE SMOKE EVAC HIGH FLOW (TUBING) ×2 IMPLANT
SOL ELECTROSURG ANTI STICK (MISCELLANEOUS) ×1
SOLUTION ELECTROSURG ANTI STCK (MISCELLANEOUS) ×2 IMPLANT
SPIKE FLUID TRANSFER (MISCELLANEOUS) ×4 IMPLANT
SPONGE T-LAP 4X18 ~~LOC~~+RFID (SPONGE) IMPLANT
SUT MNCRL 4-0 27 PS-2 XMFL (SUTURE) ×1
SUT VICRYL 0 UR6 27IN ABS (SUTURE) ×2 IMPLANT
SUTURE MNCRL 4-0 27XMF (SUTURE) ×2 IMPLANT
SYS BAG RETRIEVAL 10MM (BASKET) ×1
SYSTEM BAG RETRIEVAL 10MM (BASKET) ×2 IMPLANT
WATER STERILE IRR 500ML POUR (IV SOLUTION) ×2 IMPLANT

## 2023-09-19 NOTE — Anesthesia Postprocedure Evaluation (Signed)
Anesthesia Post Note  [name]  Procedure(s) Performed: XI ROBOTIC ASSISTED LAPAROSCOPIC CHOLECYSTECTOMY INDOCYANINE GREEN FLUORESCENCE IMAGING (ICG)  Patient location during evaluation: PACU Anesthesia Type: General Level of consciousness: awake and awake and alert Pain management: satisfactory to patient Vital Signs Assessment: post-procedure vital signs reviewed and stable Respiratory status: spontaneous breathing Cardiovascular status: blood pressure returned to baseline Anesthetic complications: no   No notable events documented.   Last Vitals:  Vitals:   09/19/23 1434 09/19/23 1445  BP:  116/79  Pulse: 97 94  Resp: 19 (!) 23  Temp:  36.8 C  SpO2: 95% 100%    Last Pain:  Vitals:   09/19/23 1445  TempSrc:   PainSc: Asleep                 VAN STAVEREN,Corran Lalone

## 2023-09-19 NOTE — Anesthesia Preprocedure Evaluation (Addendum)
Anesthesia Evaluation  Patient identified by MRN, date of birth, ID band Patient awake    Reviewed: Allergy & Precautions, NPO status , Patient's Chart, lab work & pertinent test results  Airway Mallampati: I  TM Distance: >3 FB Neck ROM: full    Dental  (+) Chipped, Dental Advidsory Given   Pulmonary former smoker   Pulmonary exam normal        Cardiovascular negative cardio ROS Normal cardiovascular exam     Neuro/Psych negative neurological ROS  negative psych ROS   GI/Hepatic Neg liver ROS,GERD  Medicated and Controlled,,  Endo/Other  negative endocrine ROS    Renal/GU      Musculoskeletal   Abdominal   Peds  Hematology negative hematology ROS (+)   Anesthesia Other Findings Past Medical History: No date: Abnormal Pap smear of cervix No date: Biliary colic No date: Calculus of kidney     Comment:  left distal ureteral stone No date: Dyspareunia No date: GERD (gastroesophageal reflux disease)  Past Surgical History: No date: CRYOTHERAPY     Comment:  AGE 42 No date: LITHOTRIPSY No date: TUBAL LIGATION  BMI    Body Mass Index: 22.70 kg/m      Reproductive/Obstetrics negative OB ROS                             Anesthesia Physical Anesthesia Plan  ASA: 2  Anesthesia Plan: General ETT   Post-op Pain Management:    Induction: Intravenous  PONV Risk Score and Plan: 3 and Ondansetron, Dexamethasone and Midazolam  Airway Management Planned: Oral ETT  Additional Equipment:   Intra-op Plan:   Post-operative Plan: Extubation in OR  Informed Consent: I have reviewed the patients History and Physical, chart, labs and discussed the procedure including the risks, benefits and alternatives for the proposed anesthesia with the patient or authorized representative who has indicated his/her understanding and acceptance.     Dental Advisory Given  Plan Discussed with:  Anesthesiologist, CRNA and Surgeon  Anesthesia Plan Comments: (Patient consented for risks of anesthesia including but not limited to:  - adverse reactions to medications - damage to eyes, teeth, lips or other oral mucosa - nerve damage due to positioning  - sore throat or hoarseness - Damage to heart, brain, nerves, lungs, other parts of body or loss of life  Patient voiced understanding and assent.)       Anesthesia Quick Evaluation

## 2023-09-19 NOTE — Op Note (Signed)
Robotic assisted laparoscopic Cholecystectomy  Pre-operative Diagnosis: Symptomatic CHolelithiasis  Post-operative Diagnosis: Same  Procedure:  Robotic assisted laparoscopic Cholecystectomy  Surgeon: Baker Pierini, MD  Anesthesia: Gen. with endotracheal tube  Findings: Adhesions to Gallbladder, large stone within gallbladder lumen  Estimated Blood Loss: 10cc       Specimens: Gallbladder           Complications: none   Procedure Details  The patient was seen again in the Holding Room. The benefits, complications, treatment options, and expected outcomes were discussed with the patient. The risks of bleeding, infection, recurrence of symptoms, failure to resolve symptoms, bile duct damage, bile duct leak, retained common bile duct stone, bowel injury, any of which could require further surgery and/or ERCP, stent, or papillotomy were reviewed with the patient. The likelihood of improving the patient's symptoms with return to their baseline status is good.  The patient and/or family concurred with the proposed plan, giving informed consent.  The patient was taken to Operating Room, identified  and the procedure verified as robotic Cholecystectomy.  A Time Out was held and the above information confirmed.  Prior to the induction of general anesthesia, antibiotic prophylaxis was administered. VTE prophylaxis was in place. General endotracheal anesthesia was then administered and tolerated well. After the induction, the abdomen was prepped with Chloraprep and draped in the sterile fashion. The patient was positioned in the supine position.  A veress needle was inserted into the abdomen using standard drop technique. An 8mm infra-umbilical robotic port was then placed under direct visualization. There was no injury noted at the site of veress needle insertion. Two right sided abdominal 8mm ports followed by an 8mm left abdominal robotic ports were placed under direct visualization. The left  sided abdominal port was then upsized to a 12mm robotic port.  The patient was positioned  in reverse Trendelenburg, robot was brought to the surgical field and docked in the standard fashion.  We made sure all the instrumentation was kept indirect view at all times and that there were no collision between the arms. I scrubbed out and went to the console.  The gallbladder was identified, the fundus grasped and retracted cephalad. Adhesions were lysed bluntly. The infundibulum was grasped and retracted laterally, exposing the peritoneum overlying the triangle of Calot. This was then divided and exposed in a blunt fashion. An extended critical view of the cystic duct and cystic artery was obtained.  The cystic duct was clearly identified and bluntly dissected.   Artery and duct were double clipped and divided. Using ICG cholangiography we visualized the cystic duct. The gallbladder was taken from the gallbladder fossa in a retrograde fashion with the electrocautery.  Hemostasis was achieved with the electrocautery. nspection of the right upper quadrant was performed. No bleeding, bile duct injury or leak, or bowel injury was noted. Robotic instruments and robotic arms were undocked in the standard fashion.  I scrubbed back in.  The gallbladder was removed and placed in an Endocatch bag.   The left lower quadrant fascia was then closed with a 0 vicryl using a suture needle passer. The pre-peritoneal space was then infiltrated with liposomal bupivicaine and marcaine solution. Pneumoperitoneum was released.  4-0 subcuticular Monocryl was used to close the skin. Dermabond was  applied.  The patient was then extubated and brought to the recovery room in stable condition. Sponge, lap, and needle counts were correct at closure and at the conclusion of the case.  Baker Pierini, M.D. [facility]

## 2023-09-19 NOTE — H&P (Signed)
No changes to below H and P, proceed as planned with robotic assisted cholecystectomy.   CC: Biliary Colic History of Present Illness Victoria [name] is a 43 y.o. female with past medical history of anxiety who presents in consultation for biliary colic.  The patient reports that about a year ago she started to develop right upper quadrant pain after eating.  She said initially that this was worse after spicy foods and she tried to cut this out.  However, she now has pain after eating more times and not.  She describes the pain as in the right upper quadrant with radiation to her shoulders.  She also notes that she has had diarrhea with this.  She sometimes will have episodes of nausea and vomiting.  She says that the pain will go away after several hours.  She denies any fevers or chills with these episodes.  She was started on a PPI but this did not help with the pain.  She has been referred to GI and has a EGD scheduled she also had an ultrasound that showed cholelithiasis.   Past Medical History     Past Medical History:  Diagnosis Date   Abnormal Pap smear of cervix     Calculus of kidney      left distal ureteral stone   Dyspareunia                   Past Surgical History:  Procedure Laterality Date   CRYOTHERAPY        AGE 41   LITHOTRIPSY       TUBAL LIGATION              Allergies       Allergies  Allergen Reactions   Bactrim Other (See Comments)      Dizziness    Sulfa Antibiotics Other (See Comments)      dizziness              Current Outpatient Medications  Medication Sig Dispense Refill   busPIRone (BUSPAR) 7.5 MG tablet TAKE 1 TABLET BY MOUTH 3 TIMES DAILY. 270 tablet 2   hydrOXYzine (ATARAX) 10 MG tablet Take 1 tablet (10 mg total) by mouth 4 (four) times daily as needed. 120 tablet 0   pantoprazole (PROTONIX) 20 MG tablet Take 1 tablet (20 mg total) by mouth 2 (two) times daily. 180 tablet 1   zolpidem (AMBIEN CR) 6.25 MG CR tablet Take 1 tablet (6.25  mg total) by mouth at bedtime as needed for sleep. 30 tablet 0      No current facility-administered medications for this visit.        Family History      Family History  Problem Relation Age of Onset   Breast cancer Maternal Aunt          1/2    Cancer Maternal Aunt          breast   Heart attack Paternal Grandfather     Heart disease Paternal Grandfather          heart atack   Heart attack Paternal Grandmother     Heart disease Paternal Grandmother          heart attack   Hypertension Brother     Alcohol abuse Brother     Pancreatitis Brother     Alcohol abuse Father              Social History Social History  Social History  Tobacco Use   Smoking status: Former      Current packs/day: 0.00      Average packs/day: 0.5 packs/day for 6.0 years (3.0 ttl pk-yrs)      Types: Cigarettes      Quit date: 08/19/2004      Years since quitting: 19.0   Smokeless tobacco: Never   Tobacco comments:      Previous social smoking- maybe 5 cigs/week  Vaping Use   Vaping status: Never Used  Substance Use Topics   Alcohol use: Yes      Comment: OCCASIONALLY   Drug use: No            ROS Full ROS of systems performed and is otherwise negative there than what is stated in the HPI   Physical Exam Blood pressure 136/77, pulse 88, temperature 98.9 F (37.2 C), temperature source Oral, height 5' 6.5" (1.689 m), weight 142 lb 12.8 oz (64.8 kg), SpO2 97%.   No acute distress, PERRLA, alert and oriented x 3, good auscultation bilaterally, regular rate and rhythm abdomen is soft, nontender and nondistended.  No Murphy sign moving all extremities spontaneously   Data Reviewed Ultrasound reviewed and there does seem to be stones within the gallbladder lumen without pericholecystic fluid or gallbladder wall thickening.  CMP done last week and notable for normal bilirubin as well as normal liver function tests   I have personally reviewed the patient's imaging and medical  records.     Assessment Assessment 43 year old with symptomatic cholelithiasis   Plan   Plan I discussed the recommendations for robotic assisted cholecystectomy.  I discussed risk, benefits alternatives of the procedure including risk of infection, bleeding, need for open, bile leak, retained stone and injury to the common bile duct.  She understands these risks and wishes to proceed.  We will use ICG.

## 2023-09-19 NOTE — Transfer of Care (Signed)
Immediate Anesthesia Transfer of Care Note  [name]  Procedure(s) Performed: XI ROBOTIC ASSISTED LAPAROSCOPIC CHOLECYSTECTOMY INDOCYANINE GREEN FLUORESCENCE IMAGING (ICG)  Patient Location: PACU  Anesthesia Type:General  Level of Consciousness: drowsy  Airway & Oxygen Therapy: Patient Spontanous Breathing and Patient connected to face mask oxygen  Post-op Assessment: Report given to RN and Post -op Vital signs reviewed and stable  Post vital signs: Reviewed and stable  Last Vitals:  Vitals Value Taken Time  BP 122/80 09/19/23 1408  Temp    Pulse 76 09/19/23 1410  Resp 12 09/19/23 1410  SpO2 100 % 09/19/23 1410  Vitals shown include unfiled device data.  Last Pain:  Vitals:   09/19/23 1157  TempSrc: Oral  PainSc: 0-No pain         Complications: No notable events documented.

## 2023-09-19 NOTE — Anesthesia Procedure Notes (Signed)
Procedure Name: Intubation Date/Time: 09/19/2023 12:42 PM  Performed by: Monico Hoar, CRNAPre-anesthesia Checklist: Patient identified, Patient being monitored, Timeout performed, Emergency Drugs available and Suction available Patient Re-evaluated:Patient Re-evaluated prior to induction Oxygen Delivery Method: Circle system utilized Preoxygenation: Pre-oxygenation with 100% oxygen Induction Type: IV induction Ventilation: Mask ventilation without difficulty Laryngoscope Size: Mac and 3 Grade View: Grade I Tube type: Oral Tube size: 7.0 mm Number of attempts: 1 Airway Equipment and Method: Stylet Placement Confirmation: ETT inserted through vocal cords under direct vision, positive ETCO2 and breath sounds checked- equal and bilateral Secured at: 21 cm Tube secured with: Tape Dental Injury: Teeth and Oropharynx as per pre-operative assessment

## 2023-09-22 LAB — SURGICAL PATHOLOGY

## 2023-09-23 ENCOUNTER — Other Ambulatory Visit: Payer: Self-pay | Admitting: General Surgery

## 2023-09-23 ENCOUNTER — Telehealth: Payer: Self-pay | Admitting: General Surgery

## 2023-09-23 MED ORDER — OXYCODONE HCL 5 MG PO TABS: 5.0000 mg | ORAL_TABLET | Freq: Three times a day (TID) | ORAL | 0 refills | Status: DC | PRN

## 2023-09-23 NOTE — Telephone Encounter (Signed)
Patient had robotic cholecystectomy done 09/19/23 Dr. Maurine Minister and is out of her Oxycodone.  Is requesting a refill.  Please call her.

## 2023-10-01 ENCOUNTER — Encounter: Payer: Self-pay | Admitting: Physician Assistant

## 2023-10-01 ENCOUNTER — Ambulatory Visit (INDEPENDENT_AMBULATORY_CARE_PROVIDER_SITE_OTHER): Payer: Managed Care, Other (non HMO) | Admitting: Physician Assistant

## 2023-10-01 VITALS — BP 119/81 | HR 70 | Temp 99.0°F | Ht 66.5 in | Wt 141.0 lb

## 2023-10-01 DIAGNOSIS — K8050 Calculus of bile duct without cholangitis or cholecystitis without obstruction: Secondary | ICD-10-CM

## 2023-10-01 DIAGNOSIS — Z09 Encounter for follow-up examination after completed treatment for conditions other than malignant neoplasm: Secondary | ICD-10-CM

## 2023-10-01 DIAGNOSIS — K8020 Calculus of gallbladder without cholecystitis without obstruction: Secondary | ICD-10-CM

## 2023-10-01 MED ORDER — CYCLOBENZAPRINE HCL 5 MG PO TABS: 5.0000 mg | ORAL_TABLET | Freq: Every day | ORAL | 0 refills | Status: DC

## 2023-10-01 NOTE — Progress Notes (Signed)
[facility] POST-OP OFFICE VISIT  10/01/2023  HPI: Victoria [name] is a 43 y.o. female 12 days s/p robotic assisted laparoscopic cholecystectomy for symptomatic cholelithiasis with Dr Maurine Minister   She has been doing well Left lateral port site remains sore but improved; worse at night No fever, chills, nausea, emesis She is tolerating PO; being careful with what she eats; no diarrhea Incisions are healing well; some ecchymosis Ambulating without issue No other complaints   Vital signs: BP 119/81   Pulse 70   Temp 99 F (37.2 C) (Oral)   Ht 5' 6.5" (1.689 m)   Wt 141 lb (64 kg)   LMP 09/13/2023 (Exact Date)   SpO2 97%   BMI 22.42 kg/m    Physical Exam: Constitutional: Well appearing female, NAD Abdomen: Soft, mild tenderness with deep palpation to the left lateral port site otherwise non-tender, non-distended, no rebound/guarding Skin: Laparoscopic incisions are healing well, various stages of healing ecchymosis, no erythema or drainage   Assessment/Plan: This is a 43 y.o. female 12 days s/p robotic assisted laparoscopic cholecystectomy for symptomatic cholelithiasis with Dr Maurine Minister    - Reviewed dietary recommendations after cholecystectomy   - Pain control prn; will send at bedtime flexeril 5 mg (10 pills) to help with pain o/w continue OTC modalities   - Reviewed wound care recommendation  - Reviewed lifting restrictions; 4 weeks total  - Reviewed surgical pathology; CCC  - She can follow up on as needed basis; She understands to call with questions/concerns  -- Lynden Oxford, PA-C [facility] 10/01/2023, 3:36 PM M-F: 7am - 4pm

## 2023-10-01 NOTE — Patient Instructions (Signed)

## 2023-10-07 ENCOUNTER — Ambulatory Visit: Admit: 2023-10-07 | Payer: Managed Care, Other (non HMO) | Admitting: Gastroenterology

## 2023-10-07 ENCOUNTER — Ambulatory Visit: Payer: Managed Care, Other (non HMO) | Admitting: Physician Assistant

## 2023-10-07 SURGERY — ESOPHAGOGASTRODUODENOSCOPY (EGD) WITH PROPOFOL
Anesthesia: General

## 2023-10-08 ENCOUNTER — Other Ambulatory Visit: Payer: Self-pay | Admitting: Family Medicine

## 2023-10-08 NOTE — Telephone Encounter (Signed)
Requested Prescriptions  Pending Prescriptions Disp Refills   hydrOXYzine (ATARAX) 10 MG tablet [Pharmacy Med Name: HYDROXYZINE HCL 10 MG TABLET] 360 tablet 0    Sig: TAKE 1 TABLET BY MOUTH 4 TIMES DAILY AS NEEDED.     Ear, Nose, and Throat:  Antihistamines 2 Passed - 10/08/2023  3:37 PM      Passed - Cr in normal range and within 360 days    Creatinine, Ser  Date Value Ref Range Status  09/05/2023 0.74 0.57 - 1.00 mg/dL Final         Passed - Valid encounter within last 12 months    Recent Outpatient Visits           2 months ago Gastroesophageal reflux disease without esophagitis   [facility] [place], [place], PA-C   4 months ago Annual physical exam   [facility] Merita Norton T, FNP   1 year ago Left knee pain, unspecified chronicity   [facility] [place], PA-C   1 year ago Annual physical exam   [facility] Merita Norton T, FNP   2 years ago Encounter for preventive health examination   [facility] Jacky Kindle, FNP       Future Appointments             In 1 week Loleta Chance, MD [facility] at [facility], [facility]   In 2 weeks Pardue, Monico Blitz, DO [facility], [facility]

## 2023-10-16 DIAGNOSIS — R351 Nocturia: Secondary | ICD-10-CM | POA: Insufficient documentation

## 2023-10-16 NOTE — Progress Notes (Unsigned)
 New Patient Evaluation and Consultation  Referring Provider: Jacky Kindle, FNP PCP: Sherlyn Hay, DO Date of Service: 10/17/2023  SUBJECTIVE Chief Complaint: No chief complaint on file.  History of Present Illness: Victoria [name] is a 43 y.o. White or Caucasian female seen in consultation at the request of NP Suzie Portela for evaluation of urinary incontinence.    OAB medication  On hydroxyzine  ***Review of records significant for: ***AUB, dyspareunia, elevated glucose  Urinary Symptoms: Leaks urine with cough/ sneeze, laughing, exercise, and lifting Leaks *** time(s) per days.  Denies pad use Patient is bothered by UI symptoms.  Day time voids 10-15.  Nocturia: 3 times per night to void with insomnia on Ambien Voiding dysfunction:  empties bladder well.  Patient does not use a catheter to empty bladder.  When urinating, patient feels the need to urinate multiple times in a row Drinks: ***oz water per day  UTIs: 1 UTI's in the last year.   {ACTIONS;DENIES/REPORTS:21021675::"Denies"} history of blood in urine, pyelonephritis, bladder cancer, and kidney cancer Kidney stones s/p lithotripsy No results found for the last 90 days.   Pelvic Organ Prolapse Symptoms:                  Patient Admits to a feeling of a bulge the vaginal area. It has been present for {NUMBER 1-10:22536} years.  Patient {denies/ admits to:24761} seeing a bulge.  This bulge is not bothersome.  Bowel Symptom: Bowel movements: 1 time(s) per day with constipation Stool consistency: loose Straining: no.  Splinting: no.  Incomplete evacuation: no.  Patient Denies accidental bowel leakage / fecal incontinence Bowel regimen: {bowel regimen:24759} Last colonoscopy: not due for age based screening Date ***, Results *** HM Colonoscopy   This patient has no relevant Health Maintenance data.     Sexual Function Sexually active: yes.  Sexual orientation: {Sexual Orientation:5041912636} Pain with sex:  No  Pelvic Pain Denies pelvic pain   Past Medical History:  Past Medical History:  Diagnosis Date   Abnormal Pap smear of cervix    Biliary colic    Calculus of kidney    left distal ureteral stone   Dyspareunia    GERD (gastroesophageal reflux disease)      Past Surgical History:   Past Surgical History:  Procedure Laterality Date   CRYOTHERAPY     AGE 67   LITHOTRIPSY     TUBAL LIGATION       Past OB/GYN History: OB History  Gravida Para Term Preterm AB Living  2 2 2  0 0 2  SAB IAB Ectopic Multiple Live Births  0 0 0 0 2    # Outcome Date GA Lbr Len/2nd Weight Sex Type Anes PTL Lv  2 Term 02/27/10 [redacted]w[redacted]d  7 lb (3.175 kg) F Vag-Spont EPI  LIV  1 Term 04/13/04    M Vag-Spont   LIV    Vaginal deliveries: 2,  Forceps/ Vacuum deliveries: ***, Cesarean section: 0 Menopausal: No, LMP No LMP recorded. (Menstrual status: IUD). Contraception: BTL. Last pap smear was ***.  Any history of abnormal pap smears: {yes/no:19897}. No results found for: "DIAGPAP", "HPVHIGH", "ADEQPAP"  Medications: Patient has a current medication list which includes the following prescription(s): acetaminophen, buspirone, cyclobenzaprine, hydroxyzine, ibuprofen, oxycodone, pantoprazole, and zolpidem.   Allergies: Patient is allergic to bactrim and sulfa antibiotics.   Social History:  Social History   Tobacco Use   Smoking status: Former    Current packs/day: 0.00    Average packs/day: 0.5  packs/day for 6.0 years (3.0 ttl pk-yrs)    Types: Cigarettes    Quit date: 08/19/2004    Years since quitting: 19.1   Smokeless tobacco: Never   Tobacco comments:    Previous social smoking- maybe 5 cigs/week  Vaping Use   Vaping status: Never Used  Substance Use Topics   Alcohol use: Yes    Comment: OCCASIONALLY   Drug use: No    Relationship status: married Patient lives with her husband and kids.   Patient is employed as a Public house manager. Regular exercise: No History of abuse:  No  Family History:   Family History  Problem Relation Age of Onset   Breast cancer Maternal Aunt        1/2    Cancer Maternal Aunt        breast   Heart attack Paternal Grandfather    Heart disease Paternal Grandfather        heart atack   Heart attack Paternal Grandmother    Heart disease Paternal Grandmother        heart attack   Hypertension Brother    Alcohol abuse Brother    Pancreatitis Brother    Alcohol abuse Father      Review of Systems: Review of Systems  Constitutional:  Negative for fever, malaise/fatigue and weight loss.  Respiratory:  Negative for cough, shortness of breath and wheezing.   Cardiovascular:  Negative for chest pain, palpitations and leg swelling.  Gastrointestinal:  Negative for abdominal pain, blood in stool and constipation.  Genitourinary:  Positive for frequency. Negative for dysuria, hematuria and urgency.       Leakage, bulge  Skin:  Negative for rash.  Neurological:  Negative for dizziness, weakness and headaches.  Endo/Heme/Allergies:  Bruises/bleeds easily.       Hot flashes  Psychiatric/Behavioral:  Negative for depression. The patient is nervous/anxious.      OBJECTIVE Physical Exam: There were no vitals filed for this visit.  Physical Exam Constitutional:      General: She is not in acute distress.    Appearance: Normal appearance.  Genitourinary:     Bladder and urethral meatus normal.     No lesions in the vagina.     Right Labia: No rash, tenderness, lesions, skin changes or Bartholin's cyst.    Left Labia: No tenderness, lesions, skin changes, Bartholin's cyst or rash.    No vaginal discharge, erythema, tenderness, bleeding, ulceration or granulation tissue.      Right Adnexa: not tender, not full and no mass present.    Left Adnexa: not tender, not full and no mass present.    No cervical motion tenderness, discharge, friability, lesion, polyp or nabothian cyst.     Uterus is not enlarged, fixed, tender or  irregular.     No uterine mass detected.    Urethral meatus caruncle not present.    No urethral prolapse, tenderness, mass, hypermobility or discharge present.     Bladder is not tender, urgency on palpation not present and masses not present.      Levator ani not tender, obturator internus not tender, no asymmetrical contractions present and no pelvic spasms present.    Anal wink present and BC reflex present. Cardiovascular:     Rate and Rhythm: Normal rate.  Pulmonary:     Effort: Pulmonary effort is normal. No respiratory distress.  Abdominal:     General: There is no distension.     Palpations: There is no mass.  Tenderness: There is no abdominal tenderness.     Hernia: No hernia is present.  Neurological:     Mental Status: She is alert.  Vitals reviewed. Exam conducted with a chaperone present.      POP-Q:   POP-Q                                               Aa                                               Ba                                                 C                                                Gh                                               Pb                                               tvl                                                Ap                                               Bp                                                 D      Rectal Exam:  Normal sphincter tone, {rectocele:24766} distal rectocele, enterocoele {DESC; PRESENT/NOT PRESENT:21021351}, no rectal masses, {sign of:24767} dyssynergia when asking the patient to bear down.  Post-Void Residual (PVR) by Bladder Scan: In order to evaluate bladder emptying, we discussed obtaining a postvoid residual and patient agreed to this procedure.  Procedure: The ultrasound unit was placed on the patient's abdomen in the suprapubic region after the patient had voided.      Laboratory Results: Lab Results  Component Value Date   COLORU yellow 05/19/2018   CLARITYU clear  05/19/2018   GLUCOSEUR Negative 12/13/2022   BILIRUBINUR negative 05/19/2018   KETONESU negative 12/13/2022   SPECGRAV 1.010 05/19/2018   RBCUR negative 12/13/2022  PHUR 6.0 05/19/2018   PROTEINUR negative 05/19/2018   UROBILINOGEN 0.2 05/19/2018   LEUKOCYTESUR Negative 12/13/2022    Lab Results  Component Value Date   CREATININE 0.74 09/05/2023   CREATININE 0.73 05/15/2023   CREATININE 0.80 05/08/2022    Lab Results  Component Value Date   HGBA1C 5.5 05/15/2023    Lab Results  Component Value Date   HGB 14.3 09/05/2023     ASSESSMENT AND PLAN Victoria [name] is a 43 y.o. with: No diagnosis found.  There are no diagnoses linked to this encounter.  Time spent: I spent *** minutes dedicated to the care of this patient on the date of this encounter to include pre-visit review of records, face-to-face time with the patient discussing *** and post visit documentation and ordering medication/ testing.   Loleta Chance, MD

## 2023-10-17 ENCOUNTER — Other Ambulatory Visit (HOSPITAL_COMMUNITY)
Admission: RE | Admit: 2023-10-17 | Discharge: 2023-10-17 | Disposition: A | Discharge status: Home / Self Care | Attending: Obstetrics | Admitting: Obstetrics

## 2023-10-17 ENCOUNTER — Ambulatory Visit: Payer: Managed Care, Other (non HMO) | Admitting: Obstetrics

## 2023-10-17 ENCOUNTER — Encounter: Payer: Self-pay | Admitting: Obstetrics

## 2023-10-17 VITALS — BP 109/73 | HR 90 | Ht 66.54 in | Wt 140.0 lb

## 2023-10-17 DIAGNOSIS — M62838 Other muscle spasm: Secondary | ICD-10-CM

## 2023-10-17 DIAGNOSIS — R351 Nocturia: Secondary | ICD-10-CM | POA: Insufficient documentation

## 2023-10-17 DIAGNOSIS — N393 Stress incontinence (female) (male): Secondary | ICD-10-CM

## 2023-10-17 DIAGNOSIS — R319 Hematuria, unspecified: Secondary | ICD-10-CM

## 2023-10-17 DIAGNOSIS — N8110 Cystocele, unspecified: Secondary | ICD-10-CM | POA: Diagnosis not present

## 2023-10-17 DIAGNOSIS — N3281 Overactive bladder: Secondary | ICD-10-CM | POA: Diagnosis not present

## 2023-10-17 LAB — POCT URINALYSIS DIPSTICK
Bilirubin, UA: NEGATIVE
Glucose, UA: NEGATIVE
Ketones, UA: NEGATIVE
Leukocytes, UA: NEGATIVE
Nitrite, UA: NEGATIVE
Protein, UA: NEGATIVE
Spec Grav, UA: 1.025 (ref 1.010–1.025)
Urobilinogen, UA: 0.2 U/dL
pH, UA: 5.5 (ref 5.0–8.0)

## 2023-10-17 LAB — URINALYSIS, ROUTINE W REFLEX MICROSCOPIC
Bacteria, UA: NONE SEEN
Bilirubin Urine: NEGATIVE
Glucose, UA: NEGATIVE mg/dL
Ketones, ur: NEGATIVE mg/dL
Leukocytes,Ua: NEGATIVE
Nitrite: NEGATIVE
Protein, ur: NEGATIVE mg/dL
Specific Gravity, Urine: 1.018 (ref 1.005–1.030)
pH: 5 (ref 5.0–8.0)

## 2023-10-17 MED ORDER — GEMTESA 75 MG PO TABS: 75.0000 mg | ORAL_TABLET | Freq: Every day | ORAL | 2 refills | Status: DC

## 2023-10-17 MED ORDER — GEMTESA 75 MG PO TABS: 75.0000 mg | ORAL_TABLET | Freq: Every day | ORAL | Status: DC

## 2023-10-17 NOTE — Assessment & Plan Note (Signed)
-   avoid fluid intake 3 hours before bedtime - reports snoring, consider workup for sleep apnea if refractory - trial of Gemtesa

## 2023-10-17 NOTE — Assessment & Plan Note (Signed)
-   For treatment of pelvic organ prolapse, we discussed options for management including expectant management, conservative management, and surgical management, such as Kegels, a pessary, pelvic floor physical therapy, and specific surgical procedures. - encouraged pelvic floor relaxation prior to Kegel exercises

## 2023-10-17 NOTE — Assessment & Plan Note (Addendum)
-   myofascial pelvic pain L >R, bladder urgency reproduced by palpation of left sided pelvic floor muscles - The origin of pelvic floor muscle spasm can be multifactorial, including primary, reactive to a different pain source, trauma, or even part of a centralized pain syndrome.Treatment options include pelvic floor physical therapy, local (vaginal) or oral  muscle relaxants, pelvic muscle trigger point injections or centrally acting pain medications.   - encouraged pelvic floor relaxation exercises - reassess in 2 months and encouraged pelvic floor PT

## 2023-10-17 NOTE — Assessment & Plan Note (Addendum)
-   most bothersome - history of left knee injury - prior use of mirabegron 25mg  without relief - We discussed the symptoms of overactive bladder (OAB), which include urinary urgency, urinary frequency, nocturia, with or without urge incontinence.  While we do not know the exact etiology of OAB, several treatment options exist. We discussed management including behavioral therapy (decreasing bladder irritants, urge suppression strategies, timed voids, bladder retraining), physical therapy, medication; for refractory cases posterior tibial nerve stimulation, sacral neuromodulation, and intravesical botulinum toxin injection.  For anticholinergic medications, we discussed the potential side effects of anticholinergics including dry eyes, dry mouth, constipation, cognitive impairment and urinary retention. For Beta-3 agonist medication, we discussed the potential side effect of elevated blood pressure which is more likely to occur in individuals with uncontrolled hypertension. - samples and Rx provided for Gemtesa - encouraged fluid management with caffeine reduction, bladder training - start pelvic floor relaxation exercises, can start Kegels after reduction of bladder discomfort and pelvic pressure - encouraged to consider pelvic floor PT - can try mirabegron if gemtesa is cost prohibitive

## 2023-10-17 NOTE — Patient Instructions (Addendum)
 For treatment of stress urinary incontinence, which is leakage with physical activity/movement/strainging/coughing, we discussed expectant management versus nonsurgical options versus surgery. Nonsurgical options include weight loss, physical therapy, as well as a pessary.  Surgical options include a midurethral sling, which is a synthetic mesh sling that acts like a hammock under the urethra to prevent leakage of urine, a Burch urethropexy, and transurethral injection of a bulking agent.   We discussed the symptoms of overactive bladder (OAB), which include urinary urgency, urinary frequency, night-time urination, with or without urge incontinence.  We discussed management including behavioral therapy (decreasing bladder irritants by following a bladder diet, urge suppression strategies, timed voids, bladder retraining), physical therapy, medication; and for refractory cases posterior tibial nerve stimulation, sacral neuromodulation, and intravesical botulinum toxin injection.   For Beta-3 agonist medication, we discussed the potential side effect of elevated blood pressure which is more likely to occur in individuals with uncontrolled hypertension. You were given samples for Gemtesa 75 mg.  It can take a month to start working so give it time, but if you have bothersome side effects call sooner and we can try a different medication.  Call us if you have trouble filling the prescription or if it's not covered by your insurance.  For night time frequency: - avoid fluid intake 3 hours before bedtime - elevated your feet during the day or use compression socks to reduce lower extremity swelling

## 2023-10-17 NOTE — Assessment & Plan Note (Signed)
-   POCT + heme, pending UA microscopy and culture - PVR 22mL WNL - For treatment of stress urinary incontinence,  non-surgical options include expectant management, weight loss, physical therapy, as well as a pessary.  Surgical options include a midurethral sling, Burch urethropexy, and transurethral injection of a bulking agent. - encouraged bladder training, pelvic relaxation followed by Kegel exercises

## 2023-10-17 NOTE — Addendum Note (Signed)
 Addended by: Alexis Frock on: 10/17/2023 11:03 AM   Modules accepted: Orders

## 2023-10-22 ENCOUNTER — Encounter: Payer: Self-pay | Admitting: Family Medicine

## 2023-10-22 ENCOUNTER — Ambulatory Visit: Admitting: Family Medicine

## 2023-10-22 VITALS — BP 100/68 | HR 89 | Temp 98.6°F | Resp 16 | Ht 66.0 in | Wt 138.0 lb

## 2023-10-22 DIAGNOSIS — F411 Generalized anxiety disorder: Secondary | ICD-10-CM

## 2023-10-22 DIAGNOSIS — J020 Streptococcal pharyngitis: Secondary | ICD-10-CM | POA: Diagnosis not present

## 2023-10-22 MED ORDER — HYDROXYZINE HCL 10 MG PO TABS: 10.0000 mg | ORAL_TABLET | Freq: Four times a day (QID) | ORAL | 3 refills | Status: AC | PRN

## 2023-10-22 MED ORDER — PENICILLIN V POTASSIUM 500 MG PO TABS: 500.0000 mg | ORAL_TABLET | Freq: Three times a day (TID) | ORAL | 0 refills | Status: AC

## 2023-10-22 NOTE — Progress Notes (Signed)
 Established patient visit   [name]   DOB: 1981-04-29   43 y.o. Female  MRN: [id number] Visit Date: 10/22/2023  Today's healthcare provider: Sherlyn Hay, DO   Chief Complaint  Patient presents with   Sore Throat    Being feeling symptoms since Monday with a low grade fever. Started off with a cough.   Subjective    Sore Throat  Associated symptoms include ear pain (left). Pertinent negatives include no abdominal pain, shortness of breath or vomiting.    Victoria [name] is a 43 year old female who presents with a sore throat and cough.  She has been experiencing a sore throat that began with a cough. The throat pain is described as 'on fire'. Her nephew, who she was with on Sunday, was diagnosed with strep throat. She has had a low-grade fever but no chills. The cough persists, and she has some nausea, which she attributes to post-nasal drainage. No ear pain, shortness of breath, or chest pain, but she does have left-sided ear discomfort, possibly referred from her throat.  She has been taking Mucinex pills without relief, although the liquid form has been helpful in the past. She has also tried Advil Cold and Sinus and regular Advil for pain relief, but these have not been effective. She is using home remedies such as soup, Gatorade, steam showers, and honey lemon tea to manage her symptoms.  She uses hydroxyzine as needed for anxiety, approximately once or twice a week, and takes Buspar for anxiety management. She occasionally uses Ambien for sleep and has muscle relaxers from a previous surgery. She reports using hydroxyzine once or twice a week and has a prescription for 120 pills, which she believes was intended to be a 30-day supply.  She is currently on a 30-day trial of Gemtesa for overactive bladder.     Medications: Outpatient Medications Prior to Visit  Medication Sig   acetaminophen (TYLENOL) 500 MG tablet Take 500 mg by mouth every 6 (six)  hours as needed for moderate pain (pain score 4-6).   busPIRone (BUSPAR) 7.5 MG tablet TAKE 1 TABLET BY MOUTH 3 TIMES DAILY. (Patient taking differently: Take 3.75 mg by mouth 3 (three) times daily.)   cyclobenzaprine (FLEXERIL) 5 MG tablet Take 1 tablet (5 mg total) by mouth at bedtime.   ibuprofen (ADVIL) 200 MG tablet Take 400 mg by mouth every 6 (six) hours as needed for moderate pain (pain score 4-6).   pantoprazole (PROTONIX) 40 MG tablet Take 40 mg by mouth every morning.   Vibegron (GEMTESA) 75 MG TABS Take 1 tablet (75 mg total) by mouth daily. LOT: [id number] EXP: 05/2026   Vibegron (GEMTESA) 75 MG TABS Take 1 tablet (75 mg total) by mouth daily.   zolpidem (AMBIEN CR) 6.25 MG CR tablet Take 1 tablet (6.25 mg total) by mouth at bedtime as needed for sleep.   [DISCONTINUED] hydrOXYzine (ATARAX) 10 MG tablet TAKE 1 TABLET BY MOUTH 4 TIMES DAILY AS NEEDED.   No facility-administered medications prior to visit.    Review of Systems  Constitutional:  Negative for appetite change, chills, fatigue and fever.  HENT:  Positive for ear pain (left).   Respiratory:  Negative for chest tightness and shortness of breath.   Cardiovascular:  Negative for chest pain and palpitations.  Gastrointestinal:  Negative for abdominal pain, nausea and vomiting.  Neurological:  Negative for dizziness and weakness.        Objective  BP 100/68 (BP Location: Left Arm, Patient Position: Sitting)   Pulse 89   Temp 98.6 F (37 C)   Resp 16   Ht 5\' 6"  (1.676 m)   Wt 138 lb (62.6 kg)   SpO2 98%   BMI 22.27 kg/m     Physical Exam Vitals reviewed.  Constitutional:      General: She is not in acute distress.    Appearance: Normal appearance. She is well-developed. She is not diaphoretic.  HENT:     Head: Normocephalic and atraumatic.     Right Ear: Tympanic membrane, ear canal and external ear normal.     Left Ear: Tympanic membrane, ear canal and external ear normal.     Nose: Nose normal.      Mouth/Throat:     Mouth: Mucous membranes are moist.     Pharynx: Oropharynx is clear. No oropharyngeal exudate.  Eyes:     General: No scleral icterus.    Conjunctiva/sclera: Conjunctivae normal.     Pupils: Pupils are equal, round, and reactive to light.  Cardiovascular:     Pulses: Normal pulses.  Musculoskeletal:     Cervical back: Neck supple.     Right lower leg: No edema.     Left lower leg: No edema.  Lymphadenopathy:     Cervical: No cervical adenopathy.  Skin:    General: Skin is warm and dry.     Findings: No rash.  Neurological:     Mental Status: She is alert.      No results found for any visits on 10/22/23.  Assessment & Plan    Pharyngitis due to Streptococcus species -     Penicillin V Potassium; Take 1 tablet (500 mg total) by mouth 3 (three) times daily for 10 days.  Dispense: 30 tablet; Refill: 0  Generalized anxiety disorder -     hydrOXYzine HCl; Take 1 tablet (10 mg total) by mouth 4 (four) times daily as needed.  Dispense: 120 tablet; Refill: 3    Pharyngitis Acute sore throat with burning sensation, likely streptococcal pharyngitis. Reports exposure to family member with streptococcal pharyngitis. Symptoms include cough, low-grade fever, and left-sided ear pain, possibly referred from throat. No shortness of breath or chest pain. Nausea possibly due to postnasal drainage. Differential includes viral and streptococcal pharyngitis. - Perform rapid strep test - Prescribe antibiotics if strep test is positive - Advise symptomatic treatment with honey lemon tea, green tea, and steam showers - Recommend chloroseptic spray for throat pain relief  Anxiety Chronic anxiety managed with hydroxyzine as needed, approximately once or twice a week. Reports occasional panic attacks and suspects ADHD, leading to difficulty slowing down thoughts. - Refill hydroxyzine prescription  Overactive Bladder Currently trialing Gemtesa for overactive bladder. Aware of high  cost and advised to switch to more affordable alternative if necessary. Mirabegron suggested as potential alternative. - Continue Gemtesa trial - Discuss switching with Dr. Olena Leatherwood if cost-prohibitive   Return if symptoms worsen or fail to improve.      I discussed the assessment and treatment plan with the patient  The patient was provided an opportunity to ask questions and all were answered. The patient agreed with the plan and demonstrated an understanding of the instructions.   The patient was advised to call back or seek an in-person evaluation if the symptoms worsen or if the condition fails to improve as anticipated.    Sherlyn Hay, DO  [facility] [phone] (phone) [phone] (fax)  [facility]

## 2023-10-24 ENCOUNTER — Telehealth: Payer: Managed Care, Other (non HMO) | Admitting: Family Medicine

## 2023-10-24 NOTE — Progress Notes (Deleted)
    MyChart Video Visit    Virtual Visit via Video Note   This format is felt to be most appropriate for this patient at this time. Physical exam was limited by quality of the video and audio technology used for the visit.   Patient location: *** Provider location: [facility]  I discussed the limitations of evaluation and management by telemedicine and the availability of in person appointments. The patient expressed understanding and agreed to proceed.  [name]   DOB: [birth year]   43 y.o. Female  MRN: [id number] Visit Date: 10/24/2023  Today's healthcare provider: Sherlyn Hay, DO   No chief complaint on file.  Subjective    HPI    ***   Medications: Outpatient Medications Prior to Visit  Medication Sig   acetaminophen (TYLENOL) 500 MG tablet Take 500 mg by mouth every 6 (six) hours as needed for moderate pain (pain score 4-6).   busPIRone (BUSPAR) 7.5 MG tablet TAKE 1 TABLET BY MOUTH 3 TIMES DAILY. (Patient taking differently: Take 3.75 mg by mouth 3 (three) times daily.)   cyclobenzaprine (FLEXERIL) 5 MG tablet Take 1 tablet (5 mg total) by mouth at bedtime.   hydrOXYzine (ATARAX) 10 MG tablet Take 1 tablet (10 mg total) by mouth 4 (four) times daily as needed.   ibuprofen (ADVIL) 200 MG tablet Take 400 mg by mouth every 6 (six) hours as needed for moderate pain (pain score 4-6).   pantoprazole (PROTONIX) 40 MG tablet Take 40 mg by mouth every morning.   penicillin v potassium (VEETID) 500 MG tablet Take 1 tablet (500 mg total) by mouth 3 (three) times daily for 10 days.   Vibegron (GEMTESA) 75 MG TABS Take 1 tablet (75 mg total) by mouth daily. LOT: [id number] EXP: 05/2026   Vibegron (GEMTESA) 75 MG TABS Take 1 tablet (75 mg total) by mouth daily.   zolpidem (AMBIEN CR) 6.25 MG CR tablet Take 1 tablet (6.25 mg total) by mouth at bedtime as needed for sleep.   No facility-administered medications prior to visit.    Review of  Systems  {Insert previous labs (optional):23779} {See past labs  Heme  Chem  Endocrine  Serology  Results Review (optional):1}   Objective    There were no vitals taken for this visit.  {Insert last BP/Wt (optional):23777}{See vitals history (optional):1}    Physical Exam     Assessment & Plan    There are no diagnoses linked to this encounter.   ***  No follow-ups on file.     I discussed the assessment and treatment plan with the patient. The patient was provided an opportunity to ask questions and all were answered. The patient agreed with the plan and demonstrated an understanding of the instructions.   The patient was advised to call back or seek an in-person evaluation if the symptoms worsen or if the condition fails to improve as anticipated.  I provided *** minutes of virtual-face-to-face time during this encounter.   Sherlyn Hay, DO [facility] [phone] (phone) [phone] (fax)  [facility]

## 2023-11-04 ENCOUNTER — Ambulatory Visit: Payer: Managed Care, Other (non HMO) | Admitting: Physician Assistant

## 2023-11-05 ENCOUNTER — Ambulatory Visit: Payer: Managed Care, Other (non HMO) | Admitting: Family Medicine

## 2023-11-23 ENCOUNTER — Encounter: Payer: Self-pay | Admitting: Obstetrics

## 2023-11-24 ENCOUNTER — Other Ambulatory Visit: Payer: Self-pay | Admitting: Obstetrics and Gynecology

## 2023-11-24 DIAGNOSIS — R351 Nocturia: Secondary | ICD-10-CM

## 2023-11-24 DIAGNOSIS — N3281 Overactive bladder: Secondary | ICD-10-CM

## 2023-11-24 MED ORDER — MIRABEGRON ER 25 MG PO TB24: 25.0000 mg | ORAL_TABLET | Freq: Every day | ORAL | 0 refills | Status: DC

## 2023-11-24 NOTE — Progress Notes (Signed)
 Patient reports the Vibegron is 500$ which is not affordable. Have sent in Myrbetriq 25mg  for 30 days, can check blood pressure and increase to 50mg  after the first month.

## 2023-12-03 ENCOUNTER — Ambulatory Visit: Admitting: Family Medicine

## 2023-12-03 ENCOUNTER — Encounter: Payer: Self-pay | Admitting: Family Medicine

## 2023-12-03 ENCOUNTER — Other Ambulatory Visit (HOSPITAL_COMMUNITY)
Admission: RE | Admit: 2023-12-03 | Discharge: 2023-12-03 | Disposition: A | Discharge status: Home / Self Care | Source: Ambulatory Visit | Attending: Family Medicine | Admitting: Family Medicine

## 2023-12-03 VITALS — BP 115/79 | HR 51 | Ht 66.0 in

## 2023-12-03 DIAGNOSIS — R87619 Unspecified abnormal cytological findings in specimens from cervix uteri: Secondary | ICD-10-CM | POA: Insufficient documentation

## 2023-12-03 DIAGNOSIS — Z124 Encounter for screening for malignant neoplasm of cervix: Secondary | ICD-10-CM | POA: Insufficient documentation

## 2023-12-03 DIAGNOSIS — Z01419 Encounter for gynecological examination (general) (routine) without abnormal findings: Secondary | ICD-10-CM

## 2023-12-03 DIAGNOSIS — Z30431 Encounter for routine checking of intrauterine contraceptive device: Secondary | ICD-10-CM | POA: Diagnosis not present

## 2023-12-03 DIAGNOSIS — Z1151 Encounter for screening for human papillomavirus (HPV): Secondary | ICD-10-CM | POA: Diagnosis not present

## 2023-12-03 DIAGNOSIS — Z1231 Encounter for screening mammogram for malignant neoplasm of breast: Secondary | ICD-10-CM

## 2023-12-03 NOTE — Progress Notes (Signed)
 Subjective:     Victoria [name] is a 43 y.o. female and is here for a comprehensive physical exam. The patient reports no problems. She has her IUD and having some acne. Minimal bleeding which is helpful.   The following portions of the patient's history were reviewed and updated as appropriate: allergies, current medications, past family history, past medical history, past social history, past surgical history, and problem list.  Review of Systems Pertinent items noted in HPI and remainder of comprehensive ROS otherwise negative.   Objective:  Chaperone present for exam   BP 115/79   Pulse (!) 51   Ht 5\' 6"  (1.676 m)   BMI 22.27 kg/m  General appearance: alert, cooperative, and appears stated age Head: Normocephalic, without obvious abnormality, atraumatic Neck: no adenopathy, supple, symmetrical, trachea midline, and thyroid not enlarged, symmetric, no tenderness/mass/nodules Lungs: clear to auscultation bilaterally Breasts: normal appearance, no masses or tenderness Heart: regular rate and rhythm, S1, S2 normal, no murmur, click, rub or gallop Abdomen: soft, non-tender; bowel sounds normal; no masses,  no organomegaly Pelvic: cervix normal in appearance, external genitalia normal, no adnexal masses or tenderness, no cervical motion tenderness, uterus normal size, shape, and consistency, and vagina normal without discharge IUD strings noted Extremities: extremities normal, atraumatic, no cyanosis or edema Pulses: 2+ and symmetric Skin: Skin color, texture, turgor normal. No rashes or lesions Lymph nodes: Cervical, supraclavicular, and axillary nodes normal. Neurologic: Grossly normal    Assessment:    Healthy female exam.      Plan:  Screening for malignant neoplasm of cervix - H/o HPV positive. check repeat testing. - Plan: IGP, Aptima HPV, rfx 16/18,45, CANCELED: Cytology - PAP  Encounter for gynecological examination without abnormal finding  Encounter for screening  mammogram for malignant neoplasm of breast - Plan: MM 3D SCREENING MAMMOGRAM BILATERAL BREAST  IUD check up - In place and controlling her bleeding     See After Visit Summary for Counseling Recommendations

## 2023-12-03 NOTE — Patient Instructions (Signed)

## 2023-12-03 NOTE — Progress Notes (Signed)
 Patient presents for Annual.  LMP: Spots here and there. Last pap:  12/13/22 Contraception:  BTL/IUD  Mammogram:  12/11/22  WNL Tony Frederickson) STD Screening: Declines  CC: Annual/None

## 2023-12-08 ENCOUNTER — Encounter: Payer: Self-pay | Admitting: Family Medicine

## 2023-12-09 LAB — CYTOLOGY - PAP
Comment: NEGATIVE
Comment: NEGATIVE
Comment: NEGATIVE
HPV 16: NEGATIVE
HPV 18 / 45: NEGATIVE
High risk HPV: POSITIVE — AB

## 2023-12-10 ENCOUNTER — Encounter: Payer: Self-pay | Admitting: Family Medicine

## 2023-12-10 ENCOUNTER — Ambulatory Visit: Admitting: Family Medicine

## 2023-12-17 ENCOUNTER — Ambulatory Visit: Admitting: Family Medicine

## 2023-12-17 ENCOUNTER — Encounter: Payer: Self-pay | Admitting: Family Medicine

## 2023-12-17 VITALS — BP 124/80 | HR 74 | Wt 140.0 lb

## 2023-12-17 DIAGNOSIS — R87612 Low grade squamous intraepithelial lesion on cytologic smear of cervix (LGSIL): Secondary | ICD-10-CM

## 2023-12-17 DIAGNOSIS — R87810 Cervical high risk human papillomavirus (HPV) DNA test positive: Secondary | ICD-10-CM | POA: Diagnosis not present

## 2023-12-17 NOTE — Progress Notes (Signed)
     GYNECOLOGY OFFICE COLPOSCOPY PROCEDURE NOTE  43 y.o. [id number] here for colposcopy for low-grade squamous intraepithelial neoplasia (LGSIL - encompassing HPV,mild dysplasia,CIN I) pap smear on 12/03/2023. Discussed role for HPV in cervical dysplasia, need for surveillance.  Patient gave informed written consent, time out was performed.  Placed in lithotomy position. Cervix viewed with speculum and colposcope after application of acetic acid.   Colposcopy adequate? Yes  acetowhite lesion(s) noted at anterior SCJ;  corresponding biopsies obtained.  ECC specimen obtained. All specimens were labeled and sent to pathology.  Chaperone was present during entire procedure.  Patient was given post procedure instructions.  Will follow up pathology and manage accordingly; patient will be contacted with results and recommendations.  Routine preventative health maintenance measures emphasized.  Granville Layer, MD 12/17/2023 10:09 AM

## 2023-12-17 NOTE — Progress Notes (Signed)
 [facility] here for Colpo   [facility]   Last pap: 12/03/23 LSIL + HR HPV

## 2023-12-18 ENCOUNTER — Ambulatory Visit
Admission: RE | Admit: 2023-12-18 | Discharge: 2023-12-18 | Disposition: A | Discharge status: Home / Self Care | Source: Ambulatory Visit | Attending: Family Medicine | Admitting: Family Medicine

## 2023-12-18 DIAGNOSIS — Z1231 Encounter for screening mammogram for malignant neoplasm of breast: Secondary | ICD-10-CM | POA: Diagnosis present

## 2023-12-22 ENCOUNTER — Encounter: Payer: Self-pay | Admitting: Family Medicine

## 2023-12-22 LAB — ANATOMIC PATHOLOGY REPORT

## 2023-12-23 LAB — ANATOMIC PATHOLOGY REPORT

## 2023-12-24 ENCOUNTER — Encounter: Payer: Self-pay | Admitting: Family Medicine

## 2023-12-25 ENCOUNTER — Other Ambulatory Visit: Payer: Self-pay | Admitting: Obstetrics and Gynecology

## 2023-12-25 DIAGNOSIS — R351 Nocturia: Secondary | ICD-10-CM

## 2023-12-25 DIAGNOSIS — N3281 Overactive bladder: Secondary | ICD-10-CM

## 2024-01-07 ENCOUNTER — Encounter: Admitting: Family Medicine

## 2024-06-25 ENCOUNTER — Encounter: Payer: Self-pay | Admitting: Family Medicine

## 2024-07-13 ENCOUNTER — Ambulatory Visit (INDEPENDENT_AMBULATORY_CARE_PROVIDER_SITE_OTHER): Admitting: Family Medicine

## 2024-07-13 ENCOUNTER — Encounter: Payer: Self-pay | Admitting: Family Medicine

## 2024-07-13 VITALS — BP 112/77 | HR 68 | Temp 98.5°F | Ht 66.5 in | Wt 154.2 lb

## 2024-07-13 DIAGNOSIS — R0981 Nasal congestion: Secondary | ICD-10-CM

## 2024-07-13 DIAGNOSIS — Z130 Encounter for screening for diseases of the blood and blood-forming organs and certain disorders involving the immune mechanism: Secondary | ICD-10-CM

## 2024-07-13 DIAGNOSIS — M170 Bilateral primary osteoarthritis of knee: Secondary | ICD-10-CM | POA: Insufficient documentation

## 2024-07-13 DIAGNOSIS — R0602 Shortness of breath: Secondary | ICD-10-CM

## 2024-07-13 DIAGNOSIS — Z0000 Encounter for general adult medical examination without abnormal findings: Secondary | ICD-10-CM

## 2024-07-13 DIAGNOSIS — R7309 Other abnormal glucose: Secondary | ICD-10-CM

## 2024-07-13 DIAGNOSIS — E782 Mixed hyperlipidemia: Secondary | ICD-10-CM | POA: Insufficient documentation

## 2024-07-13 DIAGNOSIS — Z23 Encounter for immunization: Secondary | ICD-10-CM

## 2024-07-13 NOTE — Progress Notes (Signed)
 Complete physical exam   [name]   DOB: 1980-11-06   43 y.o. Female  MRN: [id number] Visit Date: 07/13/2024  Today's healthcare provider: LAURAINE LOISE BUOY, DO   Chief Complaint  Patient presents with   Annual Exam    Diet- General, try not to eat fried or fatty foods Exercise- Not really Overall feeling- Not too bad Sleep- Pretty good Concerns- Accomodation to work from home  Declined other vaccines except for flu.   Subjective    Victoria [name] is a 43 y.o. female who presents today for a complete physical exam.   HPI HPI     Annual Exam    Additional comments: Diet- General, try not to eat fried or fatty foods Exercise- Not really Overall feeling- Not too bad Sleep- Pretty good Concerns- Accomodation to work from home  Declined other vaccines except for flu.      Last edited by Terrel Powell CROME, CMA on 07/13/2024  1:37 PM.       Victoria [name] is a 43 year old female who presents for a physical exam and flu vaccine.  She has ongoing knee pain. She previously received a cortisone shot and underwent a series of three gel injections last month at an arthritis knee pain clinic. Despite these treatments, she continues to experience tenderness and tightness in her knees. She describes episodes of severe knee pain, likening it to being stabbed, which can cause her leg muscles to seize up, leading to significant discomfort, including a prolonged 'Charlie horse' sensation in her calf. She is concerned about her ability to walk from parking to her workplace and climb stairs without an elevator, fearing her knee might seize up during these activities.  She uses hot and cold therapy for some relief and has a knee brace with vibration and hot/cold features that helps after walking. She experiences difficulty with walking, standing, and bending, noting that her knees hurt as well as pop and crack when she stands up, forcing her to stand slowly4. These symptoms  have persisted for a couple of years; she initially thought they were due to a past ankle sprain, but later learned from x-rays that she has arthritis in her knees.  She reports sinus issues, including a stopped-up nose and sinus headaches, which she manages with hot steam showers and plans to use a neti pot. No chest pain, but she notes shortness of breath with minimal activity, attributing it to being out of shape. She experiences occasional heart flutters but no persistent headaches, nausea, vomiting, or significant gastrointestinal issues, although she monitors her diet due to the absence of a gallbladder.  She works a sedentary job and is seeking accommodation to work from home due to her knee issues. She is concerned about the impact of her knee pain on her ability to perform her job duties, particularly the requirement to walk to the building she works in and climb stairs to reach her job.  She is also concerned about not being able to adequately address her knee pain while at work, as she will not be able to use alternating heat and cold therapy while at work, which she does regularly do while working from home.     Past Medical History:  Diagnosis Date   Abnormal Pap smear of cervix    Anxiety    Biliary colic    Calculus of kidney    left distal ureteral stone   Dyspareunia    GERD (  gastroesophageal reflux disease)    Past Surgical History:  Procedure Laterality Date   CHOLECYSTECTOMY     CRYOTHERAPY     AGE 35   LITHOTRIPSY     TUBAL LIGATION     Social History   Socioeconomic History   Marital status: Legally Separated    Spouse name: Not on file   Number of children: 2   Years of education: Not on file   Highest education level: Some college, no degree  Occupational History   Not on file  Tobacco Use   Smoking status: Former    Current packs/day: 0.00    Average packs/day: 0.5 packs/day for 6.0 years (3.0 ttl pk-yrs)    Types: Cigarettes    Quit date: 08/19/2004     Years since quitting: 19.9   Smokeless tobacco: Never   Tobacco comments:    Previous social smoking- maybe 5 cigs/week  Vaping Use   Vaping status: Never Used  Substance and Sexual Activity   Alcohol use: Not Currently    Comment: OCCASIONALLY   Drug use: Not Currently    Types: Marijuana   Sexual activity: Yes    Partners: Male    Birth control/protection: Surgical    Comment: tubaligation.  Other Topics Concern   Not on file  Social History Narrative   Not on file   Social Drivers of Health   Financial Resource Strain: Low Risk  (07/13/2024)   Overall Financial Resource Strain (CARDIA)    Difficulty of Paying Living Expenses: Not hard at all  Food Insecurity: No Food Insecurity (07/13/2024)   Hunger Vital Sign    Worried About Running Out of Food in the Last Year: Never true    Ran Out of Food in the Last Year: Never true  Transportation Needs: No Transportation Needs (07/13/2024)   PRAPARE - Administrator, Civil Service (Medical): No    Lack of Transportation (Non-Medical): No  Physical Activity: Insufficiently Active (07/13/2024)   Exercise Vital Sign    Days of Exercise per Week: 1 day    Minutes of Exercise per Session: 30 min  Stress: No Stress Concern Present (07/13/2024)   [employer] of Occupational Health - Occupational Stress Questionnaire    Feeling of Stress: Only a little  Social Connections: Moderately Isolated (07/13/2024)   Social Connection and Isolation Panel    Frequency of Communication with Friends and Family: More than three times a week    Frequency of Social Gatherings with Friends and Family: Once a week    Attends Religious Services: Never    Database Administrator or Organizations: No    Attends Banker Meetings: Never    Marital Status: Married  Catering Manager Violence: Not At Risk (07/13/2024)   Humiliation, Afraid, Rape, and Kick questionnaire    Fear of Current or Ex-Partner: No    Emotionally  Abused: No    Physically Abused: No    Sexually Abused: No   Family Status  Relation Name Status   Mother  Alive   Father Arley ada Deceased   Brother Medford and publishing copy Alive   Brother  Alive   Daughter  Alive   Son  Alive   Mat Aunt Pat porter Deceased   PGM Heron moore Deceased   PGF Robert moore Deceased   Neg Hx  (Not Specified)  No partnership data on file   Family History  Problem Relation Age of Onset   Heart disease Mother  Alcohol abuse Father    Hypertension Brother    Alcohol abuse Brother    Pancreatitis Brother    Breast cancer Maternal Aunt        1/2    Cancer Maternal Aunt        breast   Heart attack Paternal Grandmother    Heart disease Paternal Grandmother        heart attack   Heart attack Paternal Grandfather    Heart disease Paternal Grandfather        heart atack   Bladder Cancer Neg Hx    Uterine cancer Neg Hx    Allergies  Allergen Reactions   Bactrim Other (See Comments)    Dizziness    Sulfa Antibiotics Other (See Comments)    dizziness    Patient Care Team: Donzella Lauraine SAILOR, DO as PCP - General (Family Medicine)   Medications: Outpatient Medications Prior to Visit  Medication Sig   busPIRone  (BUSPAR ) 7.5 MG tablet TAKE 1 TABLET BY MOUTH 3 TIMES DAILY. (Patient taking differently: Take 3.75 mg by mouth 3 (three) times daily.)   hydrOXYzine  (ATARAX ) 10 MG tablet Take 1 tablet (10 mg total) by mouth 4 (four) times daily as needed.   ibuprofen  (ADVIL ) 200 MG tablet Take 400 mg by mouth every 6 (six) hours as needed for moderate pain (pain score 4-6).   mirabegron  ER (MYRBETRIQ ) 25 MG TB24 tablet TAKE 1 TABLET (25 MG TOTAL) BY MOUTH DAILY.   zolpidem  (AMBIEN  CR) 6.25 MG CR tablet Take 1 tablet (6.25 mg total) by mouth at bedtime as needed for sleep.   [DISCONTINUED] acetaminophen  (TYLENOL ) 500 MG tablet Take 500 mg by mouth every 6 (six) hours as needed for moderate pain (pain score 4-6).   No facility-administered medications  prior to visit.    Review of Systems  Constitutional:  Negative for chills, fatigue and fever.  HENT:  Negative for congestion, ear pain, rhinorrhea, sneezing and sore throat.   Eyes: Negative.  Negative for pain and redness.  Respiratory:  Positive for shortness of breath. Negative for cough and wheezing.   Cardiovascular:  Negative for chest pain and leg swelling.  Gastrointestinal:  Negative for abdominal pain, blood in stool, constipation, diarrhea and nausea.  Endocrine: Negative for polydipsia and polyphagia.  Genitourinary: Negative.  Negative for dysuria, flank pain, hematuria, pelvic pain, vaginal bleeding and vaginal discharge.  Musculoskeletal:  Positive for arthralgias (bilateral knee pain). Negative for back pain, gait problem and joint swelling.  Skin:  Negative for rash.  Neurological:  Positive for headaches (intermittent headaches, attributed to sinuses). Negative for dizziness, tremors, seizures, weakness, light-headedness and numbness.  Hematological:  Negative for adenopathy.  Psychiatric/Behavioral: Negative.  Negative for behavioral problems, confusion and dysphoric mood. The patient is not nervous/anxious and is not hyperactive.       Objective    BP 112/77 (BP Location: Right Arm, Patient Position: Sitting, Cuff Size: Normal)   Pulse 68   Temp 98.5 F (36.9 C) (Oral)   Ht 5' 6.5 (1.689 m)   Wt 154 lb 3.2 oz (69.9 kg)   SpO2 98%   BMI 24.52 kg/m    Physical Exam Vitals and nursing note reviewed.  Constitutional:      General: She is awake.     Appearance: Normal appearance.  HENT:     Head: Normocephalic and atraumatic.     Right Ear: Tympanic membrane, ear canal and external ear normal.     Left Ear: Tympanic membrane, ear canal  and external ear normal.     Nose: Nose normal.     Mouth/Throat:     Mouth: Mucous membranes are moist.     Pharynx: Oropharynx is clear. No oropharyngeal exudate or posterior oropharyngeal erythema.  Eyes:     General:  No scleral icterus.    Extraocular Movements: Extraocular movements intact.     Conjunctiva/sclera: Conjunctivae normal.     Pupils: Pupils are equal, round, and reactive to light.  Neck:     Thyroid : No thyromegaly or thyroid  tenderness.  Cardiovascular:     Rate and Rhythm: Normal rate and regular rhythm.     Pulses: Normal pulses.     Heart sounds: Normal heart sounds.  Pulmonary:     Effort: Pulmonary effort is normal. No tachypnea, bradypnea or respiratory distress.     Breath sounds: Normal breath sounds. No stridor. No wheezing, rhonchi or rales.  Abdominal:     General: Bowel sounds are normal. There is no distension.     Palpations: Abdomen is soft. There is no mass.     Tenderness: There is no abdominal tenderness. There is no guarding.     Hernia: No hernia is present.  Musculoskeletal:     Cervical back: Normal range of motion and neck supple.     Right knee: Crepitus present.     Left knee: Crepitus present.     Right lower leg: No edema.     Left lower leg: No edema.  Lymphadenopathy:     Cervical: No cervical adenopathy.  Skin:    General: Skin is warm and dry.  Neurological:     Mental Status: She is alert and oriented to person, place, and time. Mental status is at baseline.  Psychiatric:        Mood and Affect: Mood normal.        Behavior: Behavior normal.      Last depression screening scores    07/13/2024    1:50 PM 10/22/2023    3:05 PM 10/22/2023    2:31 PM  PHQ 2/9 Scores  PHQ - 2 Score 0 0 0  PHQ- 9 Score 0 1  1      Data saved with a previous flowsheet row definition   Last fall risk screening    07/13/2024    1:50 PM  Fall Risk   Falls in the past year? 1  Number falls in past yr: 0  Injury with Fall? 0   Last Audit-C alcohol use screening    07/13/2024    1:39 PM  Alcohol Use Disorder Test (AUDIT)  1. How often do you have a drink containing alcohol? 0  2. How many drinks containing alcohol do you have on a typical day when you  are drinking? 0  3. How often do you have six or more drinks on one occasion? 0  AUDIT-C Score 0   A score of 3 or more in women, and 4 or more in men indicates increased risk for alcohol abuse, EXCEPT if all of the points are from question 1   No results found for any visits on 07/13/24.  Assessment & Plan    Routine Health Maintenance and Physical Exam  Exercise Activities and Dietary recommendations  Goals   None     Immunization History  Administered Date(s) Administered   Influenza Split 06/05/2010, 08/15/2011   Influenza, Seasonal, Injecte, Preservative Fre 05/15/2023   Influenza,inj,Quad PF,6+ Mos 04/23/2017, 06/12/2018, 05/31/2019, 06/06/2020, 08/15/2021, 05/08/2022   Tdap 06/05/2010  Health Maintenance  Topic Date Due   Influenza Vaccine  03/19/2024   DTaP/Tdap/Td (2 - Td or Tdap) 10/21/2024 (Originally 06/05/2020)   COVID-19 Vaccine (1 - 2025-26 season) 04/19/2025 (Originally 04/19/2024)   Hepatitis B Vaccines 19-59 Average Risk (1 of 3 - 19+ 3-dose series) 07/13/2025 (Originally 03/27/1999)   HPV VACCINES (1 - 3-dose SCDM series) 07/13/2025 (Originally 03/27/2007)   Mammogram  12/17/2025   Cervical Cancer Screening (HPV/Pap Cotest)  12/02/2028   Hepatitis C Screening  Completed   HIV Screening  Completed   Pneumococcal Vaccine  Aged Out   Meningococcal B Vaccine  Aged Out    Discussed health benefits of physical activity, and encouraged her to engage in regular exercise appropriate for her age and condition.   Annual physical exam  Bilateral primary osteoarthritis of knee  Sinus congestion  Shortness of breath on exertion  Moderate mixed hyperlipidemia not requiring statin therapy -     Lipid panel  Elevated glucose -     Hemoglobin A1c  Screening for endocrine, metabolic and immunity disorder -     Comprehensive metabolic panel with GFR  Need for influenza vaccination -     Flu vaccine trivalent PF, 6mos and  older(Flulaval,Afluria,Fluarix,Fluzone)      Annual physical exam Routine adult wellness visit with emphasis on exercise and physical activity. Physical exam overall unremarkable except as noted above. Routine lab work ordered as noted.  - Encouraged regular exercise and physical activity. - Administer flu vaccine today.  Bilateral primary osteoarthritis of the knee Chronic bilateral knee osteoarthritis with severe pain exacerbated by walking and standing. Recent gel injections with limited relief. Discussed potential for knee replacement if symptoms persist. Consideration of glucosamine and chondroitin supplements. Cold and hot therapy provides limited relief. Concerns about work-related mobility issues. - Continue gel injections every six months as needed. - Consider glucosamine and chondroitin supplements. - Use cold and hot therapy as needed. - Follow-up with orthopedics for further discussion of potential knee replacement if symptoms persist and do not improve. - Provided FMLA documentation to recommend accommodation to work from home.  Sinus congestion  Chronic sinus congestion with associated headaches, possibly related to sinus infections. Discussed use of Flonase and neti pot for symptom relief. - Use Flonase nasal spray. - Use neti pot for nasal irrigation.  Shortness of breath on exertion Shortness of breath on exertion, possibly related to deconditioning. Discussed potential benefits of exercise to improve conditioning. - Encouraged gradual increase in physical activity to improve conditioning.  Moderate mixed hyperlipidemia not requiring statin therapy Chronic, stable.  Counseled on lifestyle modifications.  Recheck lipid panel today.    Return in about 1 year (around 07/13/2025) for CPE, Chronic f/u w/new provider.     I discussed the assessment and treatment plan with the patient  The patient was provided an opportunity to ask questions and all were answered. The  patient agreed with the plan and demonstrated an understanding of the instructions.   The patient was advised to call back or seek an in-person evaluation if the symptoms worsen or if the condition fails to improve as anticipated.    LAURAINE LOISE BUOY, DO  [facility] [phone] (phone) [phone] (fax)  [facility]

## 2024-08-07 ENCOUNTER — Other Ambulatory Visit: Payer: Self-pay | Admitting: Family Medicine

## 2024-08-07 DIAGNOSIS — F419 Anxiety disorder, unspecified: Secondary | ICD-10-CM

## 2024-08-07 DIAGNOSIS — J020 Streptococcal pharyngitis: Secondary | ICD-10-CM

## 2024-08-14 LAB — COMPREHENSIVE METABOLIC PANEL WITH GFR
ALT: 29 IU/L (ref 0–32)
AST: 24 IU/L (ref 0–40)
Albumin: 3.9 g/dL (ref 3.9–4.9)
Alkaline Phosphatase: 65 IU/L (ref 41–116)
BUN/Creatinine Ratio: 19 (ref 9–23)
BUN: 14 mg/dL (ref 6–24)
Bilirubin Total: <0.2 mg/dL (ref 0.0–1.2)
CO2: 24 mmol/L (ref 20–29)
Calcium: 8.6 mg/dL — ABNORMAL LOW (ref 8.7–10.2)
Chloride: 103 mmol/L (ref 96–106)
Creatinine, Ser: 0.72 mg/dL (ref 0.57–1.00)
Globulin, Total: 2.1 g/dL (ref 1.5–4.5)
Glucose: 98 mg/dL (ref 70–99)
Potassium: 3.9 mmol/L (ref 3.5–5.2)
Sodium: 139 mmol/L (ref 134–144)
Total Protein: 6 g/dL (ref 6.0–8.5)
eGFR: 106 mL/min/1.73

## 2024-08-14 LAB — LIPID PANEL
Chol/HDL Ratio: 3.3 ratio (ref 0.0–4.4)
Cholesterol, Total: 198 mg/dL (ref 100–199)
HDL: 60 mg/dL
LDL Chol Calc (NIH): 109 mg/dL — ABNORMAL HIGH (ref 0–99)
Triglycerides: 167 mg/dL — ABNORMAL HIGH (ref 0–149)
VLDL Cholesterol Cal: 29 mg/dL (ref 5–40)

## 2024-08-14 LAB — HEMOGLOBIN A1C
Est. average glucose Bld gHb Est-mCnc: 111 mg/dL
Hgb A1c MFr Bld: 5.5 % (ref 4.8–5.6)

## 2024-08-28 ENCOUNTER — Ambulatory Visit: Payer: Self-pay | Admitting: Family Medicine

## 2024-08-30 ENCOUNTER — Encounter: Payer: Self-pay | Admitting: *Deleted

## 2024-11-09 ENCOUNTER — Encounter: Admitting: Family
